# Patient Record
Sex: Male | Born: 1950
Health system: Southern US, Community
[De-identification: ages and names within clinical notes are randomized; demographics above are authoritative.]

## PROBLEM LIST (undated history)

## (undated) DIAGNOSIS — I10 Essential (primary) hypertension: Secondary | ICD-10-CM

## (undated) DIAGNOSIS — E785 Hyperlipidemia, unspecified: Secondary | ICD-10-CM

## (undated) DIAGNOSIS — Z951 Presence of aortocoronary bypass graft: Secondary | ICD-10-CM

## (undated) HISTORY — DX: Presence of aortocoronary bypass graft: Z95.1

---

## 2003-09-30 ENCOUNTER — Ambulatory Visit (HOSPITAL_COMMUNITY): Admission: RE | Admit: 2003-09-30 | Discharge: 2003-09-30 | Payer: Self-pay | Admitting: Gastroenterology

## 2012-01-14 ENCOUNTER — Other Ambulatory Visit: Payer: Self-pay | Admitting: Dermatology

## 2012-01-14 DIAGNOSIS — C439 Malignant melanoma of skin, unspecified: Secondary | ICD-10-CM

## 2012-01-14 HISTORY — DX: Malignant melanoma of skin, unspecified: C43.9

## 2013-07-05 ENCOUNTER — Other Ambulatory Visit: Payer: Self-pay | Admitting: Dermatology

## 2014-07-19 ENCOUNTER — Other Ambulatory Visit: Payer: Self-pay | Admitting: Dermatology

## 2014-09-08 ENCOUNTER — Other Ambulatory Visit: Payer: Self-pay | Admitting: Dermatology

## 2014-09-08 DIAGNOSIS — C4492 Squamous cell carcinoma of skin, unspecified: Secondary | ICD-10-CM

## 2014-09-08 HISTORY — DX: Squamous cell carcinoma of skin, unspecified: C44.92

## 2015-07-11 ENCOUNTER — Other Ambulatory Visit: Payer: Self-pay | Admitting: Dermatology

## 2016-08-20 ENCOUNTER — Other Ambulatory Visit: Payer: Self-pay | Admitting: Dermatology

## 2016-10-16 ENCOUNTER — Other Ambulatory Visit: Payer: Self-pay | Admitting: Dermatology

## 2018-01-06 ENCOUNTER — Ambulatory Visit (INDEPENDENT_AMBULATORY_CARE_PROVIDER_SITE_OTHER): Payer: Medicare Other

## 2018-01-06 ENCOUNTER — Ambulatory Visit: Payer: Medicare Other | Admitting: Podiatry

## 2018-01-06 DIAGNOSIS — M7750 Other enthesopathy of unspecified foot: Secondary | ICD-10-CM | POA: Diagnosis not present

## 2018-01-06 DIAGNOSIS — M778 Other enthesopathies, not elsewhere classified: Secondary | ICD-10-CM

## 2018-01-06 DIAGNOSIS — M722 Plantar fascial fibromatosis: Secondary | ICD-10-CM | POA: Diagnosis not present

## 2018-01-06 DIAGNOSIS — M79676 Pain in unspecified toe(s): Secondary | ICD-10-CM

## 2018-01-06 DIAGNOSIS — M7751 Other enthesopathy of right foot: Secondary | ICD-10-CM | POA: Diagnosis not present

## 2018-01-06 DIAGNOSIS — M779 Enthesopathy, unspecified: Principal | ICD-10-CM

## 2018-01-06 MED ORDER — METHYLPREDNISOLONE 4 MG PO TBPK: ORAL_TABLET | ORAL | 0 refills | Status: DC

## 2018-01-06 NOTE — Patient Instructions (Signed)

## 2018-01-06 NOTE — Progress Notes (Signed)
   Subjective:    Patient ID: David [name], male    DOB: Jan 31, 1951, 67 y.o.   MRN: [id number]  HPI: He presents today chief complaint of pain to the plantar aspect of his left heel times 6 months and the medial aspect of his right foot times about a year.  He states that he had treatment with custom orthotics but it is still painful under here as he points to the second metatarsal phalangeal joint of the right foot.  He states that he is an avid hiker walker and it hurts for him just to get out and walk leisurely.  He states the orthotics did not help at all.    Review of Systems  All other systems reviewed and are negative.      Objective:   Physical Exam: Vital signs are stable he is alert and oriented x3 no apparent distress.  Pulses are palpable neurologic sensorium is intact.  Deep tendon reflexes are intact muscle strength is +5/5 dorsiflexors plantar flexors inverters and everters all intrinsic musculature is intact.  Orthopedic evaluation demonstrates all joints distal to the ankle full range of motion without crepitation.  Cutaneous evaluation demonstrates supple well-hydrated cutis no erythema edema cellulitis drainage or odor.  There is pain on palpation subsecond metatarsal phalangeal joint of the right foot with mild reactive hyperkeratosis bilaterally right greater than that of the left.  There is no hammertoe deformity noted at this point.  He does however have pain on palpation medial calcaneal tubercle of the left heel and medial and lateral pain with compression of the calcaneus.  Radiographs taken today demonstrate elongated second metatarsal of the right foot with no bunion deformity no hammertoe deformities.  Left foot does demonstrate soft tissue increase in density at the plantar fascial calcaneal insertion site.  Capsulitis plantar flexed second metatarsal resulting in reactive hyperkeratosis plantar aspect of the second metatarsal phalangeal joint of the right foot.  Left  foot similarly however soft tissue increase in density is present.        Assessment & Plan:  Assessment: Plantar flexed second metatarsal with callus right foot.  Plantar fasciitis left foot.  Plan: Discussed etiology pathology conservative versus surgical therapies.  At this point I debrided all reactive hyperkeratosis and set him up with [facility] for orthotics to be made.  At this point he is recasted today for orthotics and I then injected his left heel today with Kenalog and local anesthetic 20 mg and 5 mg respectively after sterile Betadine skin prep and verbal consent.  Also started him on Medrol Dosepak to be followed by meloxicam.  Placement of plantar fascial strap.  Follow-up with him with those orthotics command.

## 2018-01-27 ENCOUNTER — Other Ambulatory Visit: Payer: Medicare Other | Admitting: Orthotics

## 2018-01-29 ENCOUNTER — Other Ambulatory Visit: Payer: Medicare Other | Admitting: Orthotics

## 2018-02-02 ENCOUNTER — Ambulatory Visit (INDEPENDENT_AMBULATORY_CARE_PROVIDER_SITE_OTHER): Payer: Medicare Other | Admitting: Orthotics

## 2018-02-02 DIAGNOSIS — M778 Other enthesopathies, not elsewhere classified: Secondary | ICD-10-CM

## 2018-02-02 DIAGNOSIS — M779 Enthesopathy, unspecified: Principal | ICD-10-CM

## 2018-02-02 NOTE — Progress Notes (Signed)
Patient came in today to pick up custom made foot orthotics.  The goals were accomplished and the patient reported no dissatisfaction with said orthotics.  Patient was advised of breakin period and how to report any issues. 

## 2018-02-11 ENCOUNTER — Telehealth: Payer: Self-pay | Admitting: Podiatry

## 2018-02-11 NOTE — Telephone Encounter (Signed)
pts wife left voicemail stating she contacted the insurance about getting orthotics covered and they said they would possibly cover.  Pt has [employer].  I returned call and pts wife stated [employer] told her they would cover if they meet criteria. I explained that they say that but that does not guarantee coverage and that if we were to file with insurance and they did not cover it it would be 398.00 verses the 300.00 they had already paid.It is very rare for any medicare/medicare advantage plan to cover orthotics. She said she understood and thanks for calling her back and they would just keep it as is.

## 2018-03-12 ENCOUNTER — Ambulatory Visit: Payer: Medicare Other | Admitting: Podiatry

## 2018-04-28 ENCOUNTER — Other Ambulatory Visit: Payer: Self-pay | Admitting: Dermatology

## 2018-10-02 ENCOUNTER — Observation Stay (HOSPITAL_BASED_OUTPATIENT_CLINIC_OR_DEPARTMENT_OTHER): Payer: Medicare Other

## 2018-10-02 ENCOUNTER — Other Ambulatory Visit: Payer: Self-pay

## 2018-10-02 ENCOUNTER — Encounter (HOSPITAL_COMMUNITY): Payer: Self-pay | Admitting: Emergency Medicine

## 2018-10-02 ENCOUNTER — Emergency Department (HOSPITAL_COMMUNITY): Payer: Medicare Other

## 2018-10-02 ENCOUNTER — Encounter (HOSPITAL_COMMUNITY)
Admission: EM | Disposition: A | Discharge status: Home / Self Care | Payer: Self-pay | Source: Home / Self Care | Attending: Cardiothoracic Surgery

## 2018-10-02 ENCOUNTER — Inpatient Hospital Stay (HOSPITAL_COMMUNITY)
Admission: EM | Admit: 2018-10-02 | Discharge: 2018-10-13 | DRG: 233 | Disposition: A | Discharge status: Home / Self Care | Payer: Medicare Other | Attending: Cardiothoracic Surgery | Admitting: Cardiothoracic Surgery

## 2018-10-02 ENCOUNTER — Ambulatory Visit: Payer: Self-pay | Admitting: Internal Medicine

## 2018-10-02 DIAGNOSIS — I10 Essential (primary) hypertension: Secondary | ICD-10-CM | POA: Diagnosis not present

## 2018-10-02 DIAGNOSIS — Z419 Encounter for procedure for purposes other than remedying health state, unspecified: Secondary | ICD-10-CM

## 2018-10-02 DIAGNOSIS — I200 Unstable angina: Secondary | ICD-10-CM | POA: Diagnosis not present

## 2018-10-02 DIAGNOSIS — I25110 Atherosclerotic heart disease of native coronary artery with unstable angina pectoris: Secondary | ICD-10-CM

## 2018-10-02 DIAGNOSIS — R001 Bradycardia, unspecified: Secondary | ICD-10-CM | POA: Diagnosis present

## 2018-10-02 DIAGNOSIS — R7989 Other specified abnormal findings of blood chemistry: Secondary | ICD-10-CM

## 2018-10-02 DIAGNOSIS — I5031 Acute diastolic (congestive) heart failure: Secondary | ICD-10-CM | POA: Diagnosis present

## 2018-10-02 DIAGNOSIS — I5030 Unspecified diastolic (congestive) heart failure: Secondary | ICD-10-CM

## 2018-10-02 DIAGNOSIS — R799 Abnormal finding of blood chemistry, unspecified: Secondary | ICD-10-CM | POA: Diagnosis not present

## 2018-10-02 DIAGNOSIS — I214 Non-ST elevation (NSTEMI) myocardial infarction: Principal | ICD-10-CM | POA: Diagnosis present

## 2018-10-02 DIAGNOSIS — Z87891 Personal history of nicotine dependence: Secondary | ICD-10-CM

## 2018-10-02 DIAGNOSIS — Z79899 Other long term (current) drug therapy: Secondary | ICD-10-CM

## 2018-10-02 DIAGNOSIS — R748 Abnormal levels of other serum enzymes: Secondary | ICD-10-CM

## 2018-10-02 DIAGNOSIS — E785 Hyperlipidemia, unspecified: Secondary | ICD-10-CM | POA: Diagnosis present

## 2018-10-02 DIAGNOSIS — I110 Hypertensive heart disease with heart failure: Secondary | ICD-10-CM | POA: Diagnosis present

## 2018-10-02 DIAGNOSIS — R778 Other specified abnormalities of plasma proteins: Secondary | ICD-10-CM

## 2018-10-02 DIAGNOSIS — Z951 Presence of aortocoronary bypass graft: Secondary | ICD-10-CM

## 2018-10-02 DIAGNOSIS — R110 Nausea: Secondary | ICD-10-CM | POA: Diagnosis not present

## 2018-10-02 DIAGNOSIS — I2510 Atherosclerotic heart disease of native coronary artery without angina pectoris: Secondary | ICD-10-CM | POA: Diagnosis present

## 2018-10-02 DIAGNOSIS — I480 Paroxysmal atrial fibrillation: Secondary | ICD-10-CM | POA: Diagnosis not present

## 2018-10-02 DIAGNOSIS — E7800 Pure hypercholesterolemia, unspecified: Secondary | ICD-10-CM | POA: Diagnosis not present

## 2018-10-02 DIAGNOSIS — E876 Hypokalemia: Secondary | ICD-10-CM | POA: Diagnosis present

## 2018-10-02 DIAGNOSIS — Z23 Encounter for immunization: Secondary | ICD-10-CM

## 2018-10-02 DIAGNOSIS — J9811 Atelectasis: Secondary | ICD-10-CM

## 2018-10-02 DIAGNOSIS — D62 Acute posthemorrhagic anemia: Secondary | ICD-10-CM | POA: Diagnosis not present

## 2018-10-02 DIAGNOSIS — N400 Enlarged prostate without lower urinary tract symptoms: Secondary | ICD-10-CM | POA: Diagnosis present

## 2018-10-02 DIAGNOSIS — I959 Hypotension, unspecified: Secondary | ICD-10-CM | POA: Diagnosis not present

## 2018-10-02 DIAGNOSIS — Z7982 Long term (current) use of aspirin: Secondary | ICD-10-CM

## 2018-10-02 DIAGNOSIS — Z8249 Family history of ischemic heart disease and other diseases of the circulatory system: Secondary | ICD-10-CM

## 2018-10-02 HISTORY — DX: Essential (primary) hypertension: I10

## 2018-10-02 HISTORY — PX: LEFT HEART CATH AND CORONARY ANGIOGRAPHY: CATH118249

## 2018-10-02 HISTORY — DX: Hyperlipidemia, unspecified: E78.5

## 2018-10-02 LAB — BASIC METABOLIC PANEL
Anion gap: 12 (ref 5–15)
BUN: 21 mg/dL (ref 8–23)
CALCIUM: 9.2 mg/dL (ref 8.9–10.3)
CO2: 25 mmol/L (ref 22–32)
CREATININE: 1.38 mg/dL — AB (ref 0.61–1.24)
Chloride: 105 mmol/L (ref 98–111)
GFR calc Af Amer: 60 mL/min — ABNORMAL LOW (ref >60)
GFR, EST NON AFRICAN AMERICAN: 51 mL/min — AB (ref >60)
Glucose, Bld: 118 mg/dL — ABNORMAL HIGH (ref 70–99)
Potassium: 3.1 mmol/L — ABNORMAL LOW (ref 3.5–5.1)
SODIUM: 142 mmol/L (ref 135–145)

## 2018-10-02 LAB — PROTIME-INR
INR: 1.1
Prothrombin Time: 14.1 seconds (ref 11.4–15.2)

## 2018-10-02 LAB — ECHOCARDIOGRAM COMPLETE
HEIGHTINCHES: 70 in
WEIGHTICAEL: 3520 [oz_av]

## 2018-10-02 LAB — LIPID PANEL
CHOL/HDL RATIO: 4.1 ratio
Cholesterol: 140 mg/dL (ref 0–200)
HDL: 34 mg/dL — AB (ref >40)
LDL CALC: 91 mg/dL (ref 0–99)
TRIGLYCERIDES: 74 mg/dL (ref <150)
VLDL: 15 mg/dL (ref 0–40)

## 2018-10-02 LAB — CBC
HCT: 47.3 % (ref 39.0–52.0)
HEMOGLOBIN: 15.2 g/dL (ref 13.0–17.0)
MCH: 28.7 pg (ref 26.0–34.0)
MCHC: 32.1 g/dL (ref 30.0–36.0)
MCV: 89.4 fL (ref 80.0–100.0)
PLATELETS: 186 10*3/uL (ref 150–400)
RBC: 5.29 MIL/uL (ref 4.22–5.81)
RDW: 13.6 % (ref 11.5–15.5)
WBC: 8 10*3/uL (ref 4.0–10.5)
nRBC: 0 % (ref 0.0–0.2)

## 2018-10-02 LAB — I-STAT TROPONIN, ED
TROPONIN I, POC: 0.23 ng/mL — AB (ref 0.00–0.08)
TROPONIN I, POC: 0.18 ng/mL — AB (ref 0.00–0.08)

## 2018-10-02 LAB — APTT: aPTT: 30 seconds (ref 24–36)

## 2018-10-02 LAB — HEMOGLOBIN A1C
HEMOGLOBIN A1C: 5.8 % — AB (ref 4.8–5.6)
MEAN PLASMA GLUCOSE: 119.76 mg/dL

## 2018-10-02 SURGERY — LEFT HEART CATH AND CORONARY ANGIOGRAPHY
Anesthesia: LOCAL

## 2018-10-02 MED ORDER — SODIUM CHLORIDE 0.9% FLUSH: 3.0000 mL | INTRAVENOUS | Status: DC | PRN

## 2018-10-02 MED ORDER — HEPARIN BOLUS VIA INFUSION
4000.0000 [IU] | Freq: Once | INTRAVENOUS | Status: AC
  Administered 2018-10-02: 4000 [IU] via INTRAVENOUS
  Filled 2018-10-02: qty 4000

## 2018-10-02 MED ORDER — ONDANSETRON HCL 4 MG/2ML IJ SOLN: 4.0000 mg | Freq: Four times a day (QID) | INTRAMUSCULAR | Status: DC | PRN

## 2018-10-02 MED ORDER — HEPARIN (PORCINE) IN NACL 1000-0.9 UT/500ML-% IV SOLN
INTRAVENOUS | Status: DC | PRN
  Administered 2018-10-02 (×2): 500 mL

## 2018-10-02 MED ORDER — AMLODIPINE BESYLATE 5 MG PO TABS
5.0000 mg | ORAL_TABLET | Freq: Every day | ORAL | Status: DC
  Administered 2018-10-02 – 2018-10-05 (×4): 5 mg via ORAL
  Filled 2018-10-02 (×4): qty 1

## 2018-10-02 MED ORDER — MIDAZOLAM HCL 2 MG/2ML IJ SOLN
INTRAMUSCULAR | Status: AC
  Filled 2018-10-02: qty 2

## 2018-10-02 MED ORDER — HEPARIN (PORCINE) IN NACL 100-0.45 UNIT/ML-% IJ SOLN
1250.0000 [IU]/h | INTRAMUSCULAR | Status: DC
  Administered 2018-10-02: 1250 [IU]/h via INTRAVENOUS
  Filled 2018-10-02: qty 250

## 2018-10-02 MED ORDER — FENTANYL CITRATE (PF) 100 MCG/2ML IJ SOLN
INTRAMUSCULAR | Status: AC
  Filled 2018-10-02: qty 2

## 2018-10-02 MED ORDER — SODIUM CHLORIDE 0.9 % WEIGHT BASED INFUSION: 1.0000 mL/kg/h | INTRAVENOUS | Status: DC

## 2018-10-02 MED ORDER — ACETAMINOPHEN 325 MG PO TABS: 650.0000 mg | ORAL_TABLET | ORAL | Status: DC | PRN

## 2018-10-02 MED ORDER — TAMSULOSIN HCL 0.4 MG PO CAPS
0.4000 mg | ORAL_CAPSULE | Freq: Every day | ORAL | Status: DC
  Administered 2018-10-02 – 2018-10-12 (×10): 0.4 mg via ORAL
  Filled 2018-10-02 (×10): qty 1

## 2018-10-02 MED ORDER — MIDAZOLAM HCL 2 MG/2ML IJ SOLN
INTRAMUSCULAR | Status: DC | PRN
  Administered 2018-10-02: 1 mg via INTRAVENOUS

## 2018-10-02 MED ORDER — SODIUM CHLORIDE 0.9 % IV SOLN: 250.0000 mL | INTRAVENOUS | Status: DC | PRN

## 2018-10-02 MED ORDER — ASPIRIN 81 MG PO CHEW: 81.0000 mg | CHEWABLE_TABLET | ORAL | Status: DC

## 2018-10-02 MED ORDER — HEPARIN SODIUM (PORCINE) 5000 UNIT/ML IJ SOLN
4000.0000 [IU] | Freq: Once | INTRAMUSCULAR | Status: DC
  Filled 2018-10-02: qty 1

## 2018-10-02 MED ORDER — SODIUM CHLORIDE 0.9% FLUSH
3.0000 mL | Freq: Two times a day (BID) | INTRAVENOUS | Status: DC
  Administered 2018-10-04 – 2018-10-05 (×2): 3 mL via INTRAVENOUS

## 2018-10-02 MED ORDER — POTASSIUM CHLORIDE CRYS ER 20 MEQ PO TBCR
40.0000 meq | EXTENDED_RELEASE_TABLET | ORAL | Status: AC
  Administered 2018-10-02: 40 meq via ORAL
  Filled 2018-10-02: qty 2

## 2018-10-02 MED ORDER — ASPIRIN 81 MG PO CHEW
324.0000 mg | CHEWABLE_TABLET | ORAL | Status: AC
  Administered 2018-10-02: 324 mg via ORAL
  Filled 2018-10-02: qty 4

## 2018-10-02 MED ORDER — VERAPAMIL HCL 2.5 MG/ML IV SOLN
INTRAVENOUS | Status: AC
  Filled 2018-10-02: qty 2

## 2018-10-02 MED ORDER — VERAPAMIL HCL 2.5 MG/ML IV SOLN
INTRAVENOUS | Status: DC | PRN
  Administered 2018-10-02: 10 mL via INTRA_ARTERIAL

## 2018-10-02 MED ORDER — ATORVASTATIN CALCIUM 80 MG PO TABS
80.0000 mg | ORAL_TABLET | Freq: Every day | ORAL | Status: DC
  Administered 2018-10-02 – 2018-10-12 (×10): 80 mg via ORAL
  Filled 2018-10-02 (×10): qty 1

## 2018-10-02 MED ORDER — NITROGLYCERIN 0.4 MG SL SUBL: 0.4000 mg | SUBLINGUAL_TABLET | SUBLINGUAL | Status: DC | PRN

## 2018-10-02 MED ORDER — ASPIRIN EC 81 MG PO TBEC: 81.0000 mg | DELAYED_RELEASE_TABLET | Freq: Every day | ORAL | Status: DC

## 2018-10-02 MED ORDER — HEPARIN SODIUM (PORCINE) 1000 UNIT/ML IJ SOLN
INTRAMUSCULAR | Status: DC | PRN
  Administered 2018-10-02: 5000 [IU] via INTRAVENOUS

## 2018-10-02 MED ORDER — FENTANYL CITRATE (PF) 100 MCG/2ML IJ SOLN
INTRAMUSCULAR | Status: DC | PRN
  Administered 2018-10-02: 25 ug via INTRAVENOUS

## 2018-10-02 MED ORDER — ASPIRIN EC 81 MG PO TBEC
81.0000 mg | DELAYED_RELEASE_TABLET | Freq: Every day | ORAL | Status: DC
  Administered 2018-10-03 – 2018-10-05 (×3): 81 mg via ORAL
  Filled 2018-10-02 (×3): qty 1

## 2018-10-02 MED ORDER — SODIUM CHLORIDE 0.9 % WEIGHT BASED INFUSION: 3.0000 mL/kg/h | INTRAVENOUS | Status: AC

## 2018-10-02 MED ORDER — SODIUM CHLORIDE 0.9% FLUSH
3.0000 mL | Freq: Two times a day (BID) | INTRAVENOUS | Status: DC
  Administered 2018-10-02 – 2018-10-06 (×2): 3 mL via INTRAVENOUS

## 2018-10-02 MED ORDER — IOHEXOL 350 MG/ML SOLN
INTRAVENOUS | Status: DC | PRN
  Administered 2018-10-02: 45 mL via INTRAVENOUS

## 2018-10-02 MED ORDER — ASPIRIN 300 MG RE SUPP
300.0000 mg | RECTAL | Status: AC
  Filled 2018-10-02: qty 1

## 2018-10-02 MED ORDER — SODIUM CHLORIDE 0.9 % IV SOLN
INTRAVENOUS | Status: DC
  Administered 2018-10-02: 10 mL/h via INTRAVENOUS

## 2018-10-02 MED ORDER — LIDOCAINE HCL (PF) 1 % IJ SOLN
INTRAMUSCULAR | Status: AC
  Filled 2018-10-02: qty 30

## 2018-10-02 MED ORDER — SODIUM CHLORIDE 0.9 % IV SOLN
INTRAVENOUS | Status: AC
  Administered 2018-10-02: 17:00:00 via INTRAVENOUS

## 2018-10-02 MED ORDER — ASPIRIN 81 MG PO CHEW
324.0000 mg | CHEWABLE_TABLET | Freq: Once | ORAL | Status: AC
  Administered 2018-10-02: 324 mg via ORAL
  Filled 2018-10-02: qty 4

## 2018-10-02 MED ORDER — HEPARIN (PORCINE) IN NACL 100-0.45 UNIT/ML-% IJ SOLN
1200.0000 [IU]/h | INTRAMUSCULAR | Status: DC
  Administered 2018-10-02 – 2018-10-05 (×4): 1200 [IU]/h via INTRAVENOUS
  Filled 2018-10-02 (×4): qty 250

## 2018-10-02 MED ORDER — LIDOCAINE HCL (PF) 1 % IJ SOLN
INTRAMUSCULAR | Status: DC | PRN
  Administered 2018-10-02: 2 mL via INTRADERMAL

## 2018-10-02 MED ORDER — HEPARIN (PORCINE) IN NACL 1000-0.9 UT/500ML-% IV SOLN
INTRAVENOUS | Status: AC
  Filled 2018-10-02: qty 1000

## 2018-10-02 MED ORDER — ASPIRIN 81 MG PO CHEW: 81.0000 mg | CHEWABLE_TABLET | Freq: Every day | ORAL | Status: DC

## 2018-10-02 SURGICAL SUPPLY — 11 items
CATH INFINITI 5 FR JL3.5 (CATHETERS) ×2 IMPLANT
CATH INFINITI JR4 5F (CATHETERS) ×1 IMPLANT
DEVICE RAD COMP TR BAND LRG (VASCULAR PRODUCTS) ×1 IMPLANT
GLIDESHEATH SLEND A-KIT 6F 22G (SHEATH) ×1 IMPLANT
GUIDEWIRE INQWIRE 1.5J.035X260 (WIRE) IMPLANT
INQWIRE 1.5J .035X260CM (WIRE) ×2
KIT HEART LEFT (KITS) ×2 IMPLANT
PACK CARDIAC CATHETERIZATION (CUSTOM PROCEDURE TRAY) ×2 IMPLANT
SHEATH PROBE COVER 6X72 (BAG) ×1 IMPLANT
TRANSDUCER W/STOPCOCK (MISCELLANEOUS) ×2 IMPLANT
TUBING CIL FLEX 10 FLL-RA (TUBING) ×2 IMPLANT

## 2018-10-02 NOTE — Progress Notes (Signed)
CT Surgery  Coronary angiograms, echocardiogram images reviewed Patient scheduled for CABG mid next week Will do full consult tomorrow P Prescott Gum

## 2018-10-02 NOTE — Progress Notes (Signed)
[place] for heparin Indication: chest pain/ACS  Heparin Dosing Weight: 93.8 kg  Labs: Recent Labs    10/02/18 0520 10/02/18 0830  HGB 15.2  --   HCT 47.3  --   PLT 186  --   APTT  --  30  LABPROT  --  14.1  INR  --  1.10  CREATININE 1.38*  --     Assessment: 48 yom presenting with CP, +troponin. Pharmacy consulted to dose heparin for ACS. Not on anticoagulation PTA. Cards planning cath. CBC wnl.  Goal of Therapy:  Heparin level 0.3-0.7 units/ml Monitor platelets by anticoagulation protocol: Yes   Plan:  Heparin 4000 unit bolus Start heparin at 1250 units/h 6h heparin level Daily heparin level/CBC Monitor s/sx bleeding  Elicia Lamp, PharmD, BCPS Clinical Pharmacist 10/02/2018 9:04 AM

## 2018-10-02 NOTE — ED Provider Notes (Signed)
[facility] EMERGENCY DEPARTMENT Provider Note   CSN: [id number] Arrival date & time: 10/02/18  0503     History   Chief Complaint Chief Complaint  Patient presents with  . abnormal labs    HPI David [name] is a 67 y.o. male with history of hypertension, hyperlipidemia, remote tobacco use is here for chest pain.  First episode of chest pain occurred on Wednesday while he was walking in his yard.  CP is described as a mild discomfort like when you are running outside in cold weather.  CP radiates to bilateral shoulders.  CP goes away after 5 to 10 minutes after sitting down and rest.  CP occurred again when walking up a hill and when blowing leaves in the yard.  Associated with shortness of breath and palpitations.  No associated nausea, vomiting, diaphoresis, lightheadedness, recent leg swelling, orthopnea, cough, fever.  He takes a daily aspirin which he took yesterday morning.  He went to see his PCP, Dr. Doyle Askew, who obtain an EKG and blood work.  He was called to this morning by on-call physician who told him to come to the ER for elevated troponin level 0.1.  No history of diabetes.  He quit smoking 40 years ago, smoked for approximately 8 years.  No EtOH or illicit drug use.  No known CAD.  Mother died of heart attack at 72, brother had a heart attack at 54.  HPI  Past Medical History:  Diagnosis Date  . Dyslipidemia   . Essential hypertension     Patient Active Problem List   Diagnosis Date Noted  . NSTEMI (non-ST elevated myocardial infarction) ([place]) 10/02/2018    History reviewed. No pertinent surgical history.      Home Medications    Prior to Admission medications   Medication Sig Start Date End Date Taking? Authorizing Provider  amLODipine (NORVASC) 5 MG tablet Take 5 mg by mouth daily.  12/30/17  Yes [provider]  aspirin EC 81 MG tablet Take 81 mg by mouth daily.   Yes [provider]  atorvastatin (LIPITOR) 20 MG tablet  Take 20 mg by mouth daily. 12/31/17  Yes [provider]  benazepril (LOTENSIN) 40 MG tablet Take 40 mg by mouth daily.  12/30/17  Yes [provider]  hydrochlorothiazide (HYDRODIURIL) 25 MG tablet Take 25 mg by mouth daily.  12/30/17  Yes [provider]  tamsulosin (FLOMAX) 0.4 MG CAPS capsule Take 0.4 mg by mouth daily. 12/30/17  Yes [provider]    Family History Family History  Problem Relation Age of Onset  . Heart attack Mother 62  . Heart attack Brother 65    Social History Social History   Tobacco Use  . Smoking status: Not on file  Substance Use Topics  . Alcohol use: Not on file  . Drug use: Not on file     Allergies   Patient has no known allergies.   Review of Systems Review of Systems  Respiratory: Positive for shortness of breath.   Cardiovascular: Positive for chest pain and palpitations.  All other systems reviewed and are negative.    Physical Exam Updated Vital Signs BP 128/74   Pulse 64   Temp (!) 97.5 F (36.4 C) (Oral)   Resp (!) 9   Ht 5\' 10"  (1.778 m)   Wt 99.8 kg   SpO2 94%   BMI 31.57 kg/m   Physical Exam  Constitutional: He appears well-developed and well-nourished.  NAD. Non toxic.  HENT:  Head: Normocephalic and atraumatic.  Nose: Nose normal.  Eyes: Conjunctivae, EOM and lids are normal.  Neck: Trachea normal and normal range of motion.  Trachea midline.   Cardiovascular: Normal rate, regular rhythm, S1 normal, S2 normal and normal heart sounds.  Pulses:      Radial pulses are 2+ on the right side, and 2+ on the left side.       Dorsalis pedis pulses are 2+ on the right side, and 2+ on the left side.  No LE edema or calf tenderness.   Pulmonary/Chest: Effort normal and breath sounds normal.  No anterior chest wall tenderness.   Abdominal: Soft. Bowel sounds are normal. There is no tenderness.  No epigastric tenderness. No distention.   Neurological: He is alert. GCS eye subscore is 4. GCS  verbal subscore is 5. GCS motor subscore is 6.  Skin: Skin is warm and dry. Capillary refill takes less than 2 seconds.  No rash to chest wall  Psychiatric: His speech is normal and behavior is normal. Thought content normal. Cognition and memory are normal.     ED Treatments / Results  Labs (all labs ordered are listed, but only abnormal results are displayed) Labs Reviewed  BASIC METABOLIC PANEL - Abnormal; Notable for the following components:      Result Value   Potassium 3.1 (*)    Glucose, Bld 118 (*)    Creatinine, Ser 1.38 (*)    GFR calc non Af Amer 51 (*)    GFR calc Af Amer 60 (*)    All other components within normal limits  I-STAT TROPONIN, ED - Abnormal; Notable for the following components:   Troponin i, poc 0.23 (*)    All other components within normal limits  I-STAT TROPONIN, ED - Abnormal; Notable for the following components:   Troponin i, poc 0.18 (*)    All other components within normal limits  CBC  PROTIME-INR  APTT  LIPID PANEL    EKG EKG Interpretation  Date/Time:  Friday October 02 2018 05:22:18 EDT Ventricular Rate:  71 PR Interval:  188 QRS Duration: 94 QT Interval:  444 QTC Calculation: 482 R Axis:   -26 Text Interpretation:  Sinus rhythm with marked sinus arrhythmia with Premature atrial complexes Septal infarct , age undetermined Abnormal ECG No previous ECGs available Confirmed by Ripley Fraise [phone]) on 10/02/2018 6:15:00 AM Also confirmed by Ripley Fraise [phone]), editor Philomena Doheny [phone])  on 10/02/2018 7:16:57 AM   Radiology Dg Chest 2 View  Result Date: 10/02/2018 CLINICAL DATA:  Chest pain EXAM: CHEST - 2 VIEW COMPARISON:  None. FINDINGS: The heart size and mediastinal contours are within normal limits. Both lungs are clear. The visualized skeletal structures are unremarkable. IMPRESSION: No active cardiopulmonary disease. Electronically Signed   By: Ulyses Jarred M.D.   On: 10/02/2018 05:58    Procedures .Critical  Care Performed by: Kinnie Feil, PA-C Authorized by: Kinnie Feil, PA-C   Critical care provider statement:    Critical care time (minutes):  45   Critical care was necessary to treat or prevent imminent or life-threatening deterioration of the following conditions:  Renal failure (NSTEMI)   Critical care was time spent personally by me on the following activities:  Discussions with consultants, evaluation of patient's response to treatment, examination of patient, ordering and performing treatments and interventions, ordering and review of laboratory studies, ordering and review of radiographic studies, pulse oximetry, re-evaluation of patient's condition, obtaining history from patient or  surrogate and review of old charts   I assumed direction of critical care for this patient from another provider in my specialty: no     (including critical care time)  Medications Ordered in ED Medications  0.9 %  sodium chloride infusion (has no administration in time range)  aspirin chewable tablet 324 mg (has no administration in time range)  amLODipine (NORVASC) tablet 5 mg (has no administration in time range)  0.9% sodium chloride infusion (has no administration in time range)    Followed by  0.9% sodium chloride infusion (has no administration in time range)  potassium chloride SA (K-DUR,KLOR-CON) CR tablet 40 mEq (has no administration in time range)     Initial Impression / Assessment and Plan / ED Course  I have reviewed the triage vital signs and the nursing notes.  Pertinent labs & imaging results that were available during my care of the patient were reviewed by me and considered in my medical decision making (see chart for details).  Clinical Course as of Oct 03 907  Fri Oct 02, 2018  2774 Potassium(!): 3.1 [CG]  1287 Creatinine(!): 1.38 [CG]  0635 Troponin i, poc(!!): 0.23 [CG]  8676 Last time CP yest afternoon    [CG]  0703 Spoke to cardiology Trinna Post), cardiology  to see.    [CG]  [id number] Troponin i, poc(!!): 0.18 [CG]    Clinical Course User Index [CG] Kinnie Feil, [place]   0705: 67 yo w/ HTN, HLD, strong family hx CAD, remote tobacco use here with exertional CP, SOB. Concern for unstable angin. Pt is CP free currently, CP appears to only occurs with light exertion.  EKG w/o STE, with intermittent PVCs.  Troponin 0 0.23.  Creatinine 1.38.  Potassium 3.1.  Will give aspirin.  Spoke to cardiology.   0745: Heparin ordered. Cardiology to see. No changes in CP.   0910: Cardiology admitted pt. Plan for LHC today and echo.   Final Clinical Impressions(s) / ED Diagnoses   Final diagnoses:  Elevated troponin  NSTEMI (non-ST elevated myocardial infarction) (H[facility])  Unstable angina pectoris (H[facility])  Elevated creatine kinase    ED Discharge Orders    None       Arlean Hopping 10/02/18 7209    Davonna Belling, MD 10/05/18 1524

## 2018-10-02 NOTE — ED Triage Notes (Signed)
Dr. Harlan Stains called to say she was sending pt to the ED, reports a positive trop yesterday of 0.1.  He went to see her for chest "pressure" since Wednesday.  No pain this morning.  Pt stated he got very "winded after working yesterday".

## 2018-10-02 NOTE — ED Notes (Signed)
Patient denies pain and is resting comfortably.  

## 2018-10-02 NOTE — ED Notes (Signed)
Lab results was given to  Nurse Mal[place].

## 2018-10-02 NOTE — Progress Notes (Signed)
  Echocardiogram 2D Echocardiogram has been performed.  David [name] 10/02/2018, 6:11 PM

## 2018-10-02 NOTE — H&P (Addendum)
Cardiology Admission History and Physical:   Patient ID: David [name] MRN: [id number]; DOB: Sep 25, 1951   Admission date: 10/02/2018  Primary Care Provider: Patient, No Pcp Per Primary Cardiologist: New to [facility] Primary Electrophysiologist:  None   Chief Complaint:  Chest pain  Patient Profile:   David [name] is a 67 y.o. male with PMH of HTN, HLD, remote tobacco abuse, who presented to his PCP office yesterday for chest pain, found to have elevated troponin of 0.1, and instructed to present to the ED for further evaluation  History of Present Illness:   David [name] was in his usual state of health until Wednesday 09/30/18, when he experienced an episode of chest pain while walking in his yard which radiated to bilateral shoulders and resolved with rest. Pain initially lasted 5-10 minutes and was without associated symptoms. He continued to have intermittent episodes of exertional chest discomfort described as a pressure-like sensation over the next 36 hours. On the evening of 10/01/18 he had chest pressure while walking up a hill blowing leaves which was associated with SOB and palpitations. He presented to his PCP office 10/01/18 for these complaints and had an EKG and blood work. He received a call this morning, notifying him that his troponin level was 0.1 and he should present to the ED for further evaluation.   He denies prior cardiac history. He has never had an ischemic evaluation. He reports PMH of HTN, HLD, and former tobacco use (quit 40 years ago). He reports a significant family history of CAD with his mother passing from an MI at age 41 and brother with an MI at age 55.   At the time of this evaluation he is chest pain free. He is fairly active at baseline and likes to go on hikes. He has never experienced chest pain prior to Wednesday. He denies SOB, DOE, dizziness, lightheadedness, syncope, orthopnea, PND, LE edema, melena, hematochezia, or hematuria. He has not had  anything to eat or drink today.   ED course: VSS. Labs notable for K 3.1, Cr 1.38 (AKI vs. CKD?), CBC wnl, Trop 0.23. EKG with sinus rhythm with frequent PACs, no STE/D, no TWI; no prior comparison. CXR without acute findings. Patient was given ASA 324mg  on arrival and started on a heparin gtt. Cardiology asked to evaluate for chest pain.    Past Medical History:  Diagnosis Date  . Dyslipidemia   . Essential hypertension     History reviewed. No pertinent surgical history.   Medications Prior to Admission: Prior to Admission medications   Medication Sig Start Date End Date Taking? Authorizing Provider  amLODipine (NORVASC) 5 MG tablet  12/30/17   [provider]  atorvastatin (LIPITOR) 20 MG tablet Take 20 mg by mouth daily. 12/31/17   [provider]  benazepril (LOTENSIN) 40 MG tablet  12/30/17   [provider]  hydrochlorothiazide (HYDRODIURIL) 25 MG tablet  12/30/17   [provider]  methylPREDNISolone (MEDROL DOSEPAK) 4 MG TBPK tablet 6 day dose pack - take as directed 01/06/18   Hyatt, Max T, DPM  tamsulosin (FLOMAX) 0.4 MG CAPS capsule Take 0.4 mg by mouth daily. 12/30/17   [provider]     Allergies:   No Known Allergies  Social History:   Social History   Socioeconomic History  . Marital status: Married    Spouse name: Not on file  . Number of children: Not on file  . Years of education: Not on file  . Highest education level:  Not on file  Occupational History  . Not on file  Social Needs  . Financial resource strain: Not on file  . Food insecurity:    Worry: Not on file    Inability: Not on file  . Transportation needs:    Medical: Not on file    Non-medical: Not on file  Tobacco Use  . Smoking status: Not on file  Substance and Sexual Activity  . Alcohol use: Not on file  . Drug use: Not on file  . Sexual activity: Not on file  Lifestyle  . Physical activity:    Days per week: Not on file    Minutes per session:  Not on file  . Stress: Not on file  Relationships  . Social connections:    Talks on phone: Not on file    Gets together: Not on file    Attends religious service: Not on file    Active member of club or organization: Not on file    Attends meetings of clubs or organizations: Not on file    Relationship status: Not on file  . Intimate partner violence:    Fear of current or ex partner: Not on file    Emotionally abused: Not on file    Physically abused: Not on file    Forced sexual activity: Not on file  Other Topics Concern  . Not on file  Social History Narrative  . Not on file    Family History:   The patient's family history includes Heart attack (age of onset: 16) in his brother; Heart attack (age of onset: 40) in his mother.    ROS:  Please see the history of present illness.  All other ROS reviewed and negative.     Physical Exam/Data:   Vitals:   10/02/18 0600 10/02/18 0615 10/02/18 0630 10/02/18 0645  BP: 136/76 119/75 125/74 (!) 123/98  Pulse: 67 65 63 65  Resp: 12 13 12 15   Temp:      TempSrc:      SpO2: 99% 93% 94% 96%  Weight:      Height:       No intake or output data in the 24 hours ending 10/02/18 0737 Filed Weights   10/02/18 0528  Weight: 99.8 kg   Body mass index is 31.57 kg/m.  General:  Well nourished, well developed, laying in bed in no acute distress HEENT: sclera anicteric Neck: no JVD Vascular: No carotid bruits; distal pulses 2+ bilaterally  Cardiac:  normal S1, S2; RRR; no murmurs, rubs, or gallops  Lungs:  clear to auscultation bilaterally, no wheezing, rhonchi or rales  Abd: soft, nontender, no hepatomegaly  Ext: no edema Musculoskeletal:  No deformities, BUE and BLE strength normal and equal Skin: warm and dry  Neuro:  CNs 2-12 intact, no focal abnormalities noted Psych:  Normal affect    EKG:  sinus rhythm with frequent PACs, no STE/D, no TWI; no prior comparison.   Relevant CV Studies: None  Laboratory  Data:  Chemistry Recent Labs  Lab 10/02/18 0520  NA 142  K 3.1*  CL 105  CO2 25  GLUCOSE 118*  BUN 21  CREATININE 1.38*  CALCIUM 9.2  GFRNONAA 51*  GFRAA 60*  ANIONGAP 12    No results for input(s): PROT, ALBUMIN, AST, ALT, ALKPHOS, BILITOT in the last 168 hours. Hematology Recent Labs  Lab 10/02/18 0520  WBC 8.0  RBC 5.29  HGB 15.2  HCT 47.3  MCV 89.4  MCH 28.7  MCHC 32.1  RDW 13.6  PLT 186   Cardiac EnzymesNo results for input(s): TROPONINI in the last 168 hours.  Recent Labs  Lab 10/02/18 0525  TROPIPOC 0.23*    BNPNo results for input(s): BNP, PROBNP in the last 168 hours.  DDimer No results for input(s): DDIMER in the last 168 hours.  Radiology/Studies:  Dg Chest 2 View  Result Date: 10/02/2018 CLINICAL DATA:  Chest pain EXAM: CHEST - 2 VIEW COMPARISON:  None. FINDINGS: The heart size and mediastinal contours are within normal limits. Both lungs are clear. The visualized skeletal structures are unremarkable. IMPRESSION: No active cardiopulmonary disease. Electronically Signed   By: Ulyses Jarred M.D.   On: 10/02/2018 05:58    Assessment and Plan:   1. Chest pain: patient presented with exertional chest pain. Found to have elevated trop of 0.1 at PCP office. Sent to ED for further evaluation. Here trop 0.23. EKG without ischemic changes. No prior ischemic evaluation. Risk factors for ACS include HTN, HLD, and family history of CAD (mother died of MI at 105yo, brother with MI at 58yo).  - Plan for LHC today to evaluate for coronary anatomy - Keep NPO - Continue heparin gtt - Continue aspirin and statin - Will check HgbA1C and lipid panel  - Check an echocardiogram to assess LV function - Gentle hydration given Cr 1.38 (unclear if this is baseline)  2. HTN: BP stable - Continue amlodipine - Would likely benefit from BBlocker if HR can tolerate.  - Hold home benzapril and HCTZ in anticipation of LHC  3. HLD: on atorvastatin 20mg  daily - Check FLP -  Will increase atorvastatin to 80mg  daily in the setting of ACS  4. BPH: - Continue flomax  5. AKI vs. CKD: Cr 1.38 on admission; denies prior history of kidney dysfunction.  - Will hydrate prior to catheterization. Recheck labs in the morning  Severity of Illness: The appropriate patient status for this patient is OBSERVATION. Observation status is judged to be reasonable and necessary in order to provide the required intensity of service to ensure the patient's safety. The patient's presenting symptoms, physical exam findings, and initial radiographic and laboratory data in the context of their medical condition is felt to place them at decreased risk for further clinical deterioration. Furthermore, it is anticipated that the patient will be medically stable for discharge from the hospital within 2 midnights of admission. The following factors support the patient status of observation.   " The patient's presenting symptoms include chest pain. " The physical exam findings include benign cardiopulmonary exam. " The initial radiographic and laboratory data are troponin elevated to  0.2.     For questions or updates, please contact [place] Please consult [email or web address] for contact info under        Signed, Abigail Butts, PA-C  10/02/2018 7:37 AM   I have seen and examined the patient along with Abigail Butts, PA-C .  I have reviewed the chart, notes and new data.  I agree with PA/NP's note.  Key new complaints: clinical syndrome very consistent with new onset and crescendo angina Key examination changes: normal CV exam except S4 gallop Key new findings / data: creat 1.38,   PLAN: Urgent cardiac catheterization, revascularization as appropriate.. This procedure has been fully reviewed with the patient and written informed consent has been obtained. IV heparin. High dose statin. ASA. Carvedilol cautiously since he is relatively bradycardic. Hydrate well and monitor renal  function carefully. Stop ACEi and HCTZ,  get records from [place] about previous renal parameters.   Sanda Klein, MD, [place] [phone] 10/02/2018, 10:39 AM

## 2018-10-02 NOTE — Plan of Care (Signed)
  Problem: Education: Goal: Knowledge of General Education information will improve Description: Including pain rating scale, medication(s)/side effects and non-pharmacologic comfort measures Outcome: Progressing   Problem: Clinical Measurements: Goal: Ability to maintain clinical measurements within normal limits will improve Outcome: Progressing   Problem: Activity: Goal: Risk for activity intolerance will decrease Outcome: Progressing   Problem: Coping: Goal: Level of anxiety will decrease Outcome: Progressing   Problem: Elimination: Goal: Will not experience complications related to urinary retention Outcome: Progressing   Problem: Safety: Goal: Ability to remain free from injury will improve Outcome: Progressing   

## 2018-10-02 NOTE — Progress Notes (Signed)
[place] for heparin Indication: chest pain/ACS  Heparin Dosing Weight: 93.8 kg  Labs: Recent Labs    10/02/18 0520 10/02/18 0830  HGB 15.2  --   HCT 47.3  --   PLT 186  --   APTT  --  30  LABPROT  --  14.1  INR  --  1.10  CREATININE 1.38*  --     Assessment: 56 yom presenting with CP, +troponin. Pharmacy consulted to dose heparin for ACS. Not on anticoagulation PTA.   S/p cath; Severe two-vessel coronary disease with mid LAD 99% stenosis within a calcified segment. The second diagonal contains 90% stenosis.   CVTS will be consulted, heparin to restart tonight after TR band is off. There was no heparin level prior to cath so will restart at previous rate.   Goal of Therapy:  Heparin level 0.3-0.7 units/ml Monitor platelets by anticoagulation protocol: Yes   Plan:  Start heparin at 1200 units/h 8h heparin level Daily heparin level/CBC Monitor s/sx bleeding  Erin Hearing PharmD., BCPS Clinical Pharmacist 10/02/2018 5:01 PM

## 2018-10-02 NOTE — Interval H&P Note (Signed)
Cath Lab Visit (complete for each Cath Lab visit)  Clinical Evaluation Leading to the Procedure:   ACS: Yes.    Non-ACS:    Anginal Classification: CCS III  Anti-ischemic medical therapy: Minimal Therapy (1 class of medications)  Non-Invasive Test Results: No non-invasive testing performed  Prior CABG: No previous CABG      History and Physical Interval Note:  10/02/2018 3:34 PM  David [name]  has presented today for surgery, with the diagnosis of NSTEMI  The various methods of treatment have been discussed with the patient and family. After consideration of risks, benefits and other options for treatment, the patient has consented to  Procedure(s): LEFT HEART CATH AND CORONARY ANGIOGRAPHY (N/A) as a surgical intervention .  The patient's history has been reviewed, patient examined, no change in status, stable for surgery.  I have reviewed the patient's chart and labs.  Questions were answered to the patient's satisfaction.     Belva Crome III

## 2018-10-03 ENCOUNTER — Encounter (HOSPITAL_COMMUNITY): Payer: Self-pay

## 2018-10-03 ENCOUNTER — Other Ambulatory Visit: Payer: Self-pay

## 2018-10-03 DIAGNOSIS — R001 Bradycardia, unspecified: Secondary | ICD-10-CM | POA: Diagnosis present

## 2018-10-03 DIAGNOSIS — I214 Non-ST elevation (NSTEMI) myocardial infarction: Secondary | ICD-10-CM | POA: Diagnosis present

## 2018-10-03 DIAGNOSIS — I4891 Unspecified atrial fibrillation: Secondary | ICD-10-CM | POA: Diagnosis not present

## 2018-10-03 DIAGNOSIS — Z01810 Encounter for preprocedural cardiovascular examination: Secondary | ICD-10-CM | POA: Diagnosis not present

## 2018-10-03 DIAGNOSIS — Z7982 Long term (current) use of aspirin: Secondary | ICD-10-CM | POA: Diagnosis not present

## 2018-10-03 DIAGNOSIS — D62 Acute posthemorrhagic anemia: Secondary | ICD-10-CM | POA: Diagnosis not present

## 2018-10-03 DIAGNOSIS — I959 Hypotension, unspecified: Secondary | ICD-10-CM | POA: Diagnosis not present

## 2018-10-03 DIAGNOSIS — I110 Hypertensive heart disease with heart failure: Secondary | ICD-10-CM | POA: Diagnosis present

## 2018-10-03 DIAGNOSIS — R110 Nausea: Secondary | ICD-10-CM | POA: Diagnosis not present

## 2018-10-03 DIAGNOSIS — I10 Essential (primary) hypertension: Secondary | ICD-10-CM | POA: Diagnosis not present

## 2018-10-03 DIAGNOSIS — I200 Unstable angina: Secondary | ICD-10-CM | POA: Diagnosis present

## 2018-10-03 DIAGNOSIS — I25110 Atherosclerotic heart disease of native coronary artery with unstable angina pectoris: Secondary | ICD-10-CM | POA: Diagnosis present

## 2018-10-03 DIAGNOSIS — R7989 Other specified abnormal findings of blood chemistry: Secondary | ICD-10-CM | POA: Diagnosis not present

## 2018-10-03 DIAGNOSIS — Z23 Encounter for immunization: Secondary | ICD-10-CM | POA: Diagnosis not present

## 2018-10-03 DIAGNOSIS — E876 Hypokalemia: Secondary | ICD-10-CM | POA: Diagnosis present

## 2018-10-03 DIAGNOSIS — I480 Paroxysmal atrial fibrillation: Secondary | ICD-10-CM | POA: Diagnosis not present

## 2018-10-03 DIAGNOSIS — N400 Enlarged prostate without lower urinary tract symptoms: Secondary | ICD-10-CM | POA: Diagnosis present

## 2018-10-03 DIAGNOSIS — Z79899 Other long term (current) drug therapy: Secondary | ICD-10-CM | POA: Diagnosis not present

## 2018-10-03 DIAGNOSIS — E785 Hyperlipidemia, unspecified: Secondary | ICD-10-CM | POA: Diagnosis present

## 2018-10-03 DIAGNOSIS — Z87891 Personal history of nicotine dependence: Secondary | ICD-10-CM | POA: Diagnosis not present

## 2018-10-03 DIAGNOSIS — I5031 Acute diastolic (congestive) heart failure: Secondary | ICD-10-CM | POA: Diagnosis present

## 2018-10-03 DIAGNOSIS — E7800 Pure hypercholesterolemia, unspecified: Secondary | ICD-10-CM | POA: Diagnosis not present

## 2018-10-03 DIAGNOSIS — Z8249 Family history of ischemic heart disease and other diseases of the circulatory system: Secondary | ICD-10-CM | POA: Diagnosis not present

## 2018-10-03 LAB — URINALYSIS, ROUTINE W REFLEX MICROSCOPIC
Bilirubin Urine: NEGATIVE
Glucose, UA: NEGATIVE mg/dL
Hgb urine dipstick: NEGATIVE
Ketones, ur: NEGATIVE mg/dL
Leukocytes, UA: NEGATIVE
Nitrite: NEGATIVE
Protein, ur: NEGATIVE mg/dL
Specific Gravity, Urine: 1.031 — ABNORMAL HIGH (ref 1.005–1.030)
pH: 6 (ref 5.0–8.0)

## 2018-10-03 LAB — BASIC METABOLIC PANEL
ANION GAP: 9 (ref 5–15)
BUN: 17 mg/dL (ref 8–23)
CALCIUM: 8.7 mg/dL — AB (ref 8.9–10.3)
CO2: 25 mmol/L (ref 22–32)
CREATININE: 1.15 mg/dL (ref 0.61–1.24)
Chloride: 108 mmol/L (ref 98–111)
GLUCOSE: 111 mg/dL — AB (ref 70–99)
Potassium: 3.4 mmol/L — ABNORMAL LOW (ref 3.5–5.1)
Sodium: 142 mmol/L (ref 135–145)

## 2018-10-03 LAB — CBC
HCT: 43 % (ref 39.0–52.0)
Hemoglobin: 13.7 g/dL (ref 13.0–17.0)
MCH: 28.4 pg (ref 26.0–34.0)
MCHC: 31.9 g/dL (ref 30.0–36.0)
MCV: 89 fL (ref 80.0–100.0)
NRBC: 0 % (ref 0.0–0.2)
PLATELETS: 162 10*3/uL (ref 150–400)
RBC: 4.83 MIL/uL (ref 4.22–5.81)
RDW: 13.6 % (ref 11.5–15.5)
WBC: 6.9 10*3/uL (ref 4.0–10.5)

## 2018-10-03 LAB — HEPARIN LEVEL (UNFRACTIONATED): HEPARIN UNFRACTIONATED: 0.37 [IU]/mL (ref 0.30–0.70)

## 2018-10-03 LAB — HIV ANTIBODY (ROUTINE TESTING W REFLEX): HIV SCREEN 4TH GENERATION: NONREACTIVE

## 2018-10-03 LAB — SURGICAL PCR SCREEN
MRSA, PCR: NEGATIVE
Staphylococcus aureus: NEGATIVE

## 2018-10-03 MED ORDER — PNEUMOCOCCAL VAC POLYVALENT 25 MCG/0.5ML IJ INJ: 0.5000 mL | INJECTION | INTRAMUSCULAR | Status: DC | PRN

## 2018-10-03 MED ORDER — INFLUENZA VAC SPLIT HIGH-DOSE 0.5 ML IM SUSY: 0.5000 mL | PREFILLED_SYRINGE | INTRAMUSCULAR | Status: DC | PRN

## 2018-10-03 MED ORDER — POTASSIUM CHLORIDE CRYS ER 20 MEQ PO TBCR
40.0000 meq | EXTENDED_RELEASE_TABLET | Freq: Once | ORAL | Status: AC
  Administered 2018-10-03: 40 meq via ORAL
  Filled 2018-10-03: qty 2

## 2018-10-03 NOTE — Progress Notes (Signed)
Patient resting comfortably during shift report. Denies complaints.  

## 2018-10-03 NOTE — Plan of Care (Signed)
?  Problem: Clinical Measurements: ?Goal: Respiratory complications will improve ?Outcome: Progressing ?  ?Problem: Activity: ?Goal: Risk for activity intolerance will decrease ?Outcome: Progressing ?  ?Problem: Nutrition: ?Goal: Adequate nutrition will be maintained ?Outcome: Progressing ?  ?Problem: Coping: ?Goal: Level of anxiety will decrease ?Outcome: Progressing ?  ?

## 2018-10-03 NOTE — Progress Notes (Signed)
Progress Note  Patient Name: David [name] Date of Encounter: 10/03/2018  Primary Cardiologist: No primary care provider on file.   Subjective   Feeling well.  No chest pain or shortness of breath since prior to the hospital.  Inpatient Medications    Scheduled Meds: . amLODipine  5 mg Oral Daily  . aspirin EC  81 mg Oral Daily  . atorvastatin  80 mg Oral q1800  . sodium chloride flush  3 mL Intravenous Q12H  . sodium chloride flush  3 mL Intravenous Q12H  . tamsulosin  0.4 mg Oral QPC supper   Continuous Infusions: . sodium chloride 10 mL/hr (10/02/18 0939)  . sodium chloride    . sodium chloride    . heparin 1,200 Units/hr (10/02/18 2337)   PRN Meds: sodium chloride, sodium chloride, acetaminophen, nitroGLYCERIN, ondansetron (ZOFRAN) IV, sodium chloride flush, sodium chloride flush   Vital Signs    Vitals:   10/02/18 1632 10/02/18 2006 10/03/18 0403 10/03/18 0407  BP: (!) 137/93 132/73  130/86  Pulse: 65   60  Resp: 20     Temp: 98.8 F (37.1 C)   97.8 F (36.6 C)  TempSrc: Oral   Oral  SpO2: 95% 93%  94%  Weight:   99.3 kg   Height:        Intake/Output Summary (Last 24 hours) at 10/03/2018 0758 Last data filed at 10/03/2018 0705 Gross per 24 hour  Intake 623.71 ml  Output -  Net 623.71 ml   Filed Weights   10/02/18 0528 10/03/18 0403  Weight: 99.8 kg 99.3 kg    Telemetry    Sinus rhythm, sinus bradycardia.  Rate 50s to 70s.  PAC.  - Personally Reviewed  ECG    Sinus rhythm.  Rate 64 bpm.  PACs.  LAFB.  Nonspecific T wave abnormalities.- Personally Reviewed  Physical Exam   VS:  BP 130/86   Pulse 60   Temp 97.8 F (36.6 C) (Oral)   Resp 20   Ht 5\' 10"  (1.778 m)   Wt 99.3 kg   SpO2 94%   BMI 31.41 kg/m  , BMI Body mass index is 31.41 kg/m. GENERAL:  Well appearing HEENT: Pupils equal round and reactive, fundi not visualized, oral mucosa unremarkable NECK:  No jugular venous distention, waveform within normal limits, carotid  upstroke brisk and symmetric, no bruits LUNGS:  Clear to auscultation bilaterally HEART:  RRR.  PMI not displaced or sustained,S1 and S2 within normal limits, no S3, no S4, no clicks, no rubs, no murmurs ABD:  Flat, positive bowel sounds normal in frequency in pitch, no bruits, no rebound, no guarding, no midline pulsatile mass, no hepatomegaly, no splenomegaly EXT:  2 plus pulses throughout, no edema, no cyanosis no clubbing SKIN:  No rashes no nodules NEURO:  Cranial nerves II through XII grossly intact, motor grossly intact throughout [facility]:  Cognitively intact, oriented to person place and time   Labs    Chemistry Recent Labs  Lab 10/02/18 0520 10/03/18 0523  NA 142 142  K 3.1* 3.4*  CL 105 108  CO2 25 25  GLUCOSE 118* 111*  BUN 21 17  CREATININE 1.38* 1.15  CALCIUM 9.2 8.7*  GFRNONAA 51* >60  GFRAA 60* >60  ANIONGAP 12 9     Hematology Recent Labs  Lab 10/02/18 0520 10/03/18 0523  WBC 8.0 6.9  RBC 5.29 4.83  HGB 15.2 13.7  HCT 47.3 43.0  MCV 89.4 89.0  MCH 28.7 28.4  MCHC  32.1 31.9  RDW 13.6 13.6  PLT 186 162    Cardiac EnzymesNo results for input(s): TROPONINI in the last 168 hours.  Recent Labs  Lab 10/02/18 0525 10/02/18 0839  TROPIPOC 0.23* 0.18*     BNPNo results for input(s): BNP, PROBNP in the last 168 hours.   DDimer No results for input(s): DDIMER in the last 168 hours.   Radiology    Dg Chest 2 View  Result Date: 10/02/2018 CLINICAL DATA:  Chest pain EXAM: CHEST - 2 VIEW COMPARISON:  None. FINDINGS: The heart size and mediastinal contours are within normal limits. Both lungs are clear. The visualized skeletal structures are unremarkable. IMPRESSION: No active cardiopulmonary disease. Electronically Signed   By: Ulyses Jarred M.D.   On: 10/02/2018 05:58    Cardiac Studies   Echo 10/02/18: Study Conclusions  - Left ventricle: The cavity size was normal. Systolic function was   normal. The estimated ejection fraction was in the  range of 55%   to 60%. Mild hypokinesis of the basal-midinferior and   inferoseptal myocardium; consistent with ischemia or infarction   in the distribution of the right coronary artery. Doppler   parameters are consistent with abnormal left ventricular   relaxation (grade 1 diastolic dysfunction). - Left atrium: The atrium was mildly dilated.  LHC 10/02/18:  Severe two-vessel coronary disease with mid LAD 99% stenosis within a calcified segment.  The second diagonal contains 90% stenosis.  The diagonal is large.  Circumflex artery is normal  The RCA is large giving origin to PDA and to left ventricular branches.  It is totally occluded in the mid vessel and also distally beyond the PDA.  Both the PDA and left ventricular branches fill by right to right and left to right collaterals.    The circumflex coronary artery free of obstructive disease.  Left ventricular function overall appears normal.  EF is at least 50%.  LVEDP is normal.  RECOMMENDATIONS:   Unlikely that RCA can be completely revascularized with CTO technique given the distal total occlusion beyond the PDA.  Therefore with severe LAD disease and significant diagonal obstruction, coronary surgical revascularization has been recommended.  Would anticipate LIMA to LAD, SVG to the diagonal branch, and SVG to PDA and LV branch.  An alternative strategy if needed could be culprit LAD and diagonal PCI.  Medical therapy for the chronically occluded RCA.  Patient Profile     67 y.o. male with hypertension, hyperlipidemia, and prior tobacco abuse here with NSTEMI and found to have severe two-vessel CAD not amenable to PCI.  Assessment & Plan    # NSTEMI: # Hyperlipidemia: Plan for CABG next week per Dr. Prescott Gum.  He is clinically stable and is symptomatic at this time.  Atorvastatin was increased this admission.  He will need repeat lipids and a CMP in 6 to 8 weeks.  Continue aspirin and heparin infusion.  #  Hypertension:  BP well-controlled.  Currently on amlodipine.  His home HCTZ and benazepril are on hold.  He has had hypokalemia.  HCTZ may not be the best option for his hypertension.  He is not on a beta-blocker due to bradycardia.  For questions or updates, please contact [place] Please consult [email or web address] for contact info under        Signed, Skeet Latch, MD  10/03/2018, 7:58 AM

## 2018-10-03 NOTE — Care Management Obs Status (Signed)
[place] NOTIFICATION   Patient Details  Name: David [name] MRN: [id number] Date of Birth: [birth year]   Medicare Observation Status Notification Given:  Yes    Carles Collet, RN 10/03/2018, 11:44 AM

## 2018-10-03 NOTE — Progress Notes (Signed)
[place] for heparin Indication: chest pain/ACS  Heparin Dosing Weight: 93.8 kg  Labs: Recent Labs    10/02/18 0520 10/02/18 0830 10/03/18 0523 10/03/18 0829  HGB 15.2  --  13.7  --   HCT 47.3  --  43.0  --   PLT 186  --  162  --   APTT  --  30  --   --   LABPROT  --  14.1  --   --   INR  --  1.10  --   --   HEPARINUNFRC  --   --   --  0.37  CREATININE 1.38*  --  1.15  --     Assessment: 71 yom presenting with CP, +troponin. Pharmacy consulted to dose heparin for ACS. Not on anticoagulation PTA.   S/p cath; Severe two-vessel coronary disease with mid LAD 99% stenosis within a calcified segment. The second diagonal contains 90% stenosis.    No bleeding per nurse, Hgb stable, plts stable  Hep level=0.37  Goal of Therapy:  Heparin level 0.3-0.7 units/ml Monitor platelets by anticoagulation protocol: Yes   Plan:  Continue heparin at 1200 units/h Monitor daily heparin level/CBC Monitor s/sx bleeding  Gwenlyn Found, Sherian Rein D PGY1 Pharmacy Resident  Phone [phone] 10/03/2018   9:45 AM

## 2018-10-03 NOTE — Progress Notes (Signed)
CARDIAC REHAB PHASE I   PRE:  Rate/Rhythm: 63  BP:  Supine: 152/89 Sitting:   Standing:    SaO2: 99% RA  MODE:  Ambulation: 470 ft   POST:  Rate/Rhythm: 69  BP:  Supine: 153/86 Sitting:   Standing:    SaO2: 99% RA  [id number] Preop education complete including sternal precautions. In-the-tube handout and OHS care guide given. Pt verbalizes understanding of instructions given. Pt tolerated ambulation well without symptoms. To bed after walk, IV intact, call bell within reach.   Sol Passer, MS, ACSM CEP

## 2018-10-03 NOTE — Consult Note (Signed)
[address]       [place] [address]             [id number]        [place] P[place] Medical Record #[id number] Date of Birth: December 02, 1951  Referring: No ref. provider found Primary Care: Patient, No Pcp Per Primary Cardiologist:Mihai Croitoru, MD  Chief Complaint:   Chest pain Chief Complaint  Patient presents with  . abnormal labs  Patient examined, images of coronary angiogram and 2D echocardiogram personally reviewed and counseled with patient and wife  History of Present Illness:      Very nice 67 year old retired male admitted to the hospital after developing exertional chest pain relieved by rest while he was doing yard work earlier in the week.  He presented to a urgent care center or EKG and cardiac enzymes were performed.  The troponin was mildly elevated and he was referred to [facility] for admission and further evaluation.  His risk factors include hypertension, dyslipidemia, positive family history of CAD-MI, and remote history of smoking.  He has previously been in good health and has never been hospitalized.  Cardiac catheterization was performed by Dr. Tamala Julian which demonstrated chronic occlusion of the RCA with collateralization of the PD and PL.  The LAD diagonal had tight 90% stenosis.  LV EF was normal.  LVEDP is normal.  Echocardiogram shows normal LV systolic function, no significant valvular disease.  Dr. Tamala Julian is recommended multivessel CABG as best long-term therapy of his coronary disease.  The patient was placed on IV heparin after cardiac catheterization via right radial artery and he has had no further symptoms. Current Activity/ Functional Status: Active lifestyle, retired, lives with wife   Zubrod Score: At the time of surgery this patient's most appropriate activity status/level should be described as: []     0    Normal activity, no symptoms [x]     1    Restricted in physical strenuous activity but ambulatory, able to  do out light work []     2    Ambulatory and capable of self care, unable to do work activities, up and about                 more than 50%  Of the time                            []     3    Only limited self care, in bed greater than 50% of waking hours []     4    Completely disabled, no self care, confined to bed or chair []     5    Moribund  Past Medical History:  Diagnosis Date  . Dyslipidemia   . Essential hypertension     Past Surgical History:  Procedure Laterality Date  . LEFT HEART CATH AND CORONARY ANGIOGRAPHY N/A 10/02/2018   Procedure: LEFT HEART CATH AND CORONARY ANGIOGRAPHY;  Surgeon: Belva Crome, MD;  Location: [place] [facility];  Service: Cardiovascular;  Laterality: N/A;    Social History   Tobacco Use  Smoking Status Not on file    Social History   Substance and Sexual Activity  Alcohol Use Not on file     No Known Allergies  Current Facility-Administered Medications  Medication Dose Route Frequency Provider Last Rate Last Dose  . 0.9 %  sodium chloride infusion  250 mL Intravenous PRN Tommye Standard, Daleen Snook M.,  PA-C      . 0.9 %  sodium chloride infusion  250 mL Intravenous PRN Belva Crome, MD      . acetaminophen (TYLENOL) tablet 650 mg  650 mg Oral Q4H PRN Tommye Standard, Krista M., PA-C      . amLODipine (NORVASC) tablet 5 mg  5 mg Oral Daily Kroeger, Krista M., PA-C   5 mg at 10/03/18 0933  . aspirin EC tablet 81 mg  81 mg Oral Daily Roby Lofts M., PA-C   81 mg at 10/03/18 1950  . atorvastatin (LIPITOR) tablet 80 mg  80 mg Oral q1800 Roby Lofts M., PA-C   80 mg at 10/02/18 1829  . heparin ADULT infusion 100 units/mL (25000 units/219mL sodium chloride 0.45%)  1,200 Units/hr Intravenous Continuous Lyndee Leo, RPH 12 mL/hr at 10/03/18 0934 1,200 Units/hr at 10/03/18 0934  . nitroGLYCERIN (NITROSTAT) SL tablet 0.4 mg  0.4 mg Sublingual Q5 Min x 3 PRN Tommye Standard, Krista M., PA-C      . ondansetron [facility]) injection 4 mg  4 mg Intravenous Q6H PRN  Tommye Standard, Krista M., PA-C      . sodium chloride flush (NS) 0.9 % injection 3 mL  3 mL Intravenous Q12H Kroeger, Krista M., PA-C   3 mL at 10/02/18 2119  . sodium chloride flush (NS) 0.9 % injection 3 mL  3 mL Intravenous PRN Kroeger, Krista M., PA-C      . sodium chloride flush (NS) 0.9 % injection 3 mL  3 mL Intravenous Q12H Belva Crome, MD      . sodium chloride flush (NS) 0.9 % injection 3 mL  3 mL Intravenous PRN Belva Crome, MD      . tamsulosin [facility]) capsule 0.4 mg  0.4 mg Oral QPC supper Kroeger, Krista M., PA-C   0.4 mg at 10/02/18 1829    Medications Prior to Admission  Medication Sig Dispense Refill Last Dose  . amLODipine (NORVASC) 5 MG tablet Take 5 mg by mouth daily.    10/01/2018 at Unknown time  . aspirin EC 81 MG tablet Take 81 mg by mouth daily.   10/01/2018 at Unknown time  . atorvastatin (LIPITOR) 20 MG tablet Take 20 mg by mouth daily.  2 10/01/2018 at Unknown time  . benazepril (LOTENSIN) 40 MG tablet Take 40 mg by mouth daily.    10/01/2018 at Unknown time  . hydrochlorothiazide (HYDRODIURIL) 25 MG tablet Take 25 mg by mouth daily.    10/01/2018 at Unknown time  . tamsulosin (FLOMAX) 0.4 MG CAPS capsule Take 0.4 mg by mouth daily.  0 10/01/2018 at Unknown time    Family History  Problem Relation Age of Onset  . Heart attack Mother 24  . Heart attack Brother 50     Review of Systems:   ROS      Cardiac Review of Systems: Y or  [    ]= no  Chest Pain [  y  ]  Resting SOB [   ] Exertional SOB  [ y ]  Orthopnea [  ]   Pedal Edema [   ]    Palpitations [  ] Syncope  [  ]   Presyncope [   ]  General Review of Systems: [Y] = yes [  ]=no Constitional: recent weight change [  ]; anorexia [  ]; fatigue [  ]; nausea [  ]; night sweats [  ]; fever [  ]; or chills [  ]  Dental: Last Dentist visit: One year  Eye : blurred vision [  ]; diplopia [   ]; vision changes [  ];  Amaurosis fugax[  ]; Resp:  cough [  ];  wheezing[  ];  hemoptysis[  ]; shortness of breath[  ]; paroxysmal nocturnal dyspnea[  ]; dyspnea on exertion[  ]; or orthopnea[  ];  GI:  gallstones[  ], vomiting[  ];  dysphagia[  ]; melena[  ];  hematochezia [  ]; heartburn[y  ];   Hx of  Colonoscopy[  ]; GU: kidney stones [  ]; hematuria[  ];   dysuria [  ];  nocturia[ y ];  history of     obstruction [  ]; urinary frequency Blue.Reese  ]             Skin: rash, swelling[  ];, hair loss[  ];  peripheral edema[  ];  or itching[  ]; Musculosketetal: myalgias[  ];  joint swelling[  ];  joint erythema[  ];  joint pain[  ];  back pain[  ];  Heme/Lymph: bruising[  ];  bleeding[  ];  anemia[  ];  Neuro: TIA[  ];  headaches[  ];  stroke[  ];  vertigo[  ];  seizures[  ];   paresthesias[  ];  difficulty walking[  ];  Psych:depression[  ]; anxiety[  ];  Endocrine: diabetes[  ];  thyroid dysfunction[  ];                 Physical Exam: BP (!) 135/96   Pulse 60   Temp 97.8 F (36.6 C) (Oral)   Resp 20   Ht 5\' 10"  (1.778 m)   Wt 99.3 kg   SpO2 94%   BMI 31.41 kg/m        Physical Exam  General: Well-nourished healthy-appearing middle-aged Caucasian male no acute distress HEENT: Normocephalic pupils equal , dentition adequate Neck: Supple without JVD, adenopathy, or bruit Chest: Clear to auscultation, symmetrical breath sounds, no rhonchi, no tenderness             or deformity Cardiovascular: Regular rate and rhythm, no murmur, no gallop, peripheral pulses             palpable in all extremities Abdomen:  Soft, nontender, no palpable mass or organomegaly Extremities: Warm, well-perfused, no clubbing cyanosis edema or tenderness,              no venous stasis changes of the legs Rectal/GU: Deferred Neuro: Grossly non--focal and symmetrical throughout Skin: Clean and dry without rash or ulceration   Diagnostic Studies & Laboratory data:     Recent Radiology Findings:   Dg Chest 2 View  Result Date: 10/02/2018 CLINICAL DATA:   Chest pain EXAM: CHEST - 2 VIEW COMPARISON:  None. FINDINGS: The heart size and mediastinal contours are within normal limits. Both lungs are clear. The visualized skeletal structures are unremarkable. IMPRESSION: No active cardiopulmonary disease. Electronically Signed   By: Ulyses Jarred M.D.   On: 10/02/2018 05:58     I have independently reviewed the above radiologic studies and discussed with the patient   Recent Lab Findings: Lab Results  Component Value Date   WBC 6.9 10/03/2018   HGB 13.7 10/03/2018   HCT 43.0 10/03/2018   PLT 162 10/03/2018   GLUCOSE 111 (H) 10/03/2018   CHOL 140 10/02/2018   TRIG 74 10/02/2018   HDL 34 (L) 10/02/2018   LDLCALC 91 10/02/2018   NA 142 10/03/2018  K 3.4 (L) 10/03/2018   CL 108 10/03/2018   CREATININE 1.15 10/03/2018   BUN 17 10/03/2018   CO2 25 10/03/2018   INR 1.10 10/02/2018   HGBA1C 5.8 (H) 10/02/2018      Assessment / Plan:   Unstable angina, non-STEMI, severe three-vessel CAD with preserved LV systolic function  Patient will be prepared for multivessel CABG on a.m. Tuesday, October 15.  I discussed the procedure CABG in detail with the patient and his wife including the expected benefits, potential risks, and the expected postoperative hospital recovery.  He agrees to proceed with surgery as planned.     Dahlia Byes MD 10/03/2018 10:09 AM

## 2018-10-04 ENCOUNTER — Inpatient Hospital Stay (HOSPITAL_COMMUNITY): Payer: Medicare Other

## 2018-10-04 DIAGNOSIS — Z01810 Encounter for preprocedural cardiovascular examination: Secondary | ICD-10-CM

## 2018-10-04 LAB — CBC
HEMATOCRIT: 44.3 % (ref 39.0–52.0)
Hemoglobin: 14.3 g/dL (ref 13.0–17.0)
MCH: 28.7 pg (ref 26.0–34.0)
MCHC: 32.3 g/dL (ref 30.0–36.0)
MCV: 88.8 fL (ref 80.0–100.0)
PLATELETS: 165 10*3/uL (ref 150–400)
RBC: 4.99 MIL/uL (ref 4.22–5.81)
RDW: 13.6 % (ref 11.5–15.5)
WBC: 7.6 10*3/uL (ref 4.0–10.5)
nRBC: 0 % (ref 0.0–0.2)

## 2018-10-04 LAB — PROTIME-INR
INR: 1.1
Prothrombin Time: 14.1 seconds (ref 11.4–15.2)

## 2018-10-04 LAB — BASIC METABOLIC PANEL
Anion gap: 8 (ref 5–15)
BUN: 15 mg/dL (ref 8–23)
CALCIUM: 8.8 mg/dL — AB (ref 8.9–10.3)
CHLORIDE: 109 mmol/L (ref 98–111)
CO2: 24 mmol/L (ref 22–32)
CREATININE: 1.15 mg/dL (ref 0.61–1.24)
GFR calc non Af Amer: >60 mL/min (ref >60)
GLUCOSE: 110 mg/dL — AB (ref 70–99)
Potassium: 3.7 mmol/L (ref 3.5–5.1)
Sodium: 141 mmol/L (ref 135–145)

## 2018-10-04 LAB — HEPATIC FUNCTION PANEL
ALT: 19 U/L (ref 0–44)
AST: 20 U/L (ref 15–41)
Albumin: 3.8 g/dL (ref 3.5–5.0)
Alkaline Phosphatase: 43 U/L (ref 38–126)
Bilirubin, Direct: 0.2 mg/dL (ref 0.0–0.2)
Indirect Bilirubin: 0.9 mg/dL (ref 0.3–0.9)
Total Bilirubin: 1.1 mg/dL (ref 0.3–1.2)
Total Protein: 6.5 g/dL (ref 6.5–8.1)

## 2018-10-04 LAB — HEPARIN LEVEL (UNFRACTIONATED): Heparin Unfractionated: 0.34 IU/mL (ref 0.30–0.70)

## 2018-10-04 LAB — TSH: TSH: 7.17 u[IU]/mL — ABNORMAL HIGH (ref 0.350–4.500)

## 2018-10-04 MED ORDER — BENAZEPRIL HCL 20 MG PO TABS
20.0000 mg | ORAL_TABLET | Freq: Every day | ORAL | Status: DC
  Administered 2018-10-04 – 2018-10-05 (×2): 20 mg via ORAL
  Filled 2018-10-04 (×3): qty 1

## 2018-10-04 NOTE — Progress Notes (Signed)
Patient resting comfortably during shift report. Denies complaints.  

## 2018-10-04 NOTE — Progress Notes (Signed)
Pre-op Cardiac Surgery  Carotid Findings:   ICA 1-39% stenosis bilaterally   Upper Extremity Right Left  Brachial Pressures 140 140  Radial Waveforms triphasic triphasic  Ulnar Waveforms triphasic triphasic  Palmar Arch (Allen's Test) wnl wnl    Lower  Extremity Right Left  Dorsalis Pedis triphasic triphasic      Posterior Tibial triphasic triphasic       Abram Sander 10/04/2018 11:27 AM

## 2018-10-04 NOTE — Progress Notes (Addendum)
EKG requested per Q AM order.

## 2018-10-04 NOTE — Progress Notes (Signed)
Progress Note  Patient Name: David [name] Date of Encounter: 10/04/2018  Primary Cardiologist: Sanda Klein, MD   Subjective   Feeling well.  No chest pain or shortness of breath.  Ambulating without difficulty.  Inpatient Medications    Scheduled Meds: . amLODipine  5 mg Oral Daily  . aspirin EC  81 mg Oral Daily  . atorvastatin  80 mg Oral q1800  . sodium chloride flush  3 mL Intravenous Q12H  . sodium chloride flush  3 mL Intravenous Q12H  . tamsulosin  0.4 mg Oral QPC supper   Continuous Infusions: . sodium chloride    . sodium chloride    . heparin 1,200 Units/hr (10/03/18 2045)   PRN Meds: sodium chloride, sodium chloride, acetaminophen, Influenza vac split quadrivalent PF, nitroGLYCERIN, ondansetron (ZOFRAN) IV, pneumococcal 23 valent vaccine, sodium chloride flush, sodium chloride flush   Vital Signs    Vitals:   10/03/18 0933 10/03/18 1107 10/03/18 2142 10/04/18 0606  BP: (!) 135/96 (!) 141/85 126/82 139/88  Pulse:  62 60 (!) 58  Resp:  18 18 18   Temp:  98.4 F (36.9 C) 98.7 F (37.1 C) 98.2 F (36.8 C)  TempSrc:  Oral Oral Oral  SpO2:  94% 95% 95%  Weight:    98.9 kg  Height:        Intake/Output Summary (Last 24 hours) at 10/04/2018 0842 Last data filed at 10/04/2018 0715 Gross per 24 hour  Intake 529.89 ml  Output 700 ml  Net -170.11 ml   Filed Weights   10/02/18 0528 10/03/18 0403 10/04/18 0606  Weight: 99.8 kg 99.3 kg 98.9 kg    Telemetry    Sinus rhythm, sinus bradycardia.  Rate 50s to 70s.  PAC.  - Personally Reviewed  ECG    Sinus rhythm.  Rate 64 bpm.  PACs.  LAFB.  Nonspecific T wave abnormalities.- Personally Reviewed  Physical Exam   VS:  BP 139/88   Pulse (!) 58   Temp 98.2 F (36.8 C) (Oral)   Resp 18   Ht 5\' 10"  (1.778 m)   Wt 98.9 kg   SpO2 95%   BMI 31.28 kg/m  , BMI Body mass index is 31.28 kg/m. GENERAL:  Well appearing HEENT: Pupils equal round and reactive, fundi not visualized, oral mucosa  unremarkable NECK:  No jugular venous distention, waveform within normal limits, carotid upstroke brisk and symmetric, no bruits, no thyromegaly LYMPHATICS:  No cervical adenopathy LUNGS:  Clear to auscultation bilaterally HEART:  RRR.  PMI not displaced or sustained,S1 and S2 within normal limits, no S3, no S4, no clicks, no rubs, no murmurs ABD:  Flat, positive bowel sounds normal in frequency in pitch, no bruits, no rebound, no guarding, no midline pulsatile mass, no hepatomegaly, no splenomegaly EXT:  2 plus pulses throughout, no edema, no cyanosis no clubbing SKIN:  No rashes no nodules NEURO:  Cranial nerves II through XII grossly intact, motor grossly intact throughout [facility]:  Cognitively intact, oriented to person place and time    Labs    Chemistry Recent Labs  Lab 10/02/18 0520 10/03/18 0523 10/04/18 0421  NA 142 142 141  K 3.1* 3.4* 3.7  CL 105 108 109  CO2 25 25 24   GLUCOSE 118* 111* 110*  BUN 21 17 15   CREATININE 1.38* 1.15 1.15  CALCIUM 9.2 8.7* 8.8*  PROT  --   --  6.5  ALBUMIN  --   --  3.8  AST  --   --  20  ALT  --   --  19  ALKPHOS  --   --  43  BILITOT  --   --  1.1  GFRNONAA 51* >60 >60  GFRAA 60* >60 >60  ANIONGAP 12 9 8      Hematology Recent Labs  Lab 10/02/18 0520 10/03/18 0523 10/04/18 0421  WBC 8.0 6.9 7.6  RBC 5.29 4.83 4.99  HGB 15.2 13.7 14.3  HCT 47.3 43.0 44.3  MCV 89.4 89.0 88.8  MCH 28.7 28.4 28.7  MCHC 32.1 31.9 32.3  RDW 13.6 13.6 13.6  PLT 186 162 165    Cardiac EnzymesNo results for input(s): TROPONINI in the last 168 hours.  Recent Labs  Lab 10/02/18 0525 10/02/18 0839  TROPIPOC 0.23* 0.18*     BNPNo results for input(s): BNP, PROBNP in the last 168 hours.   DDimer No results for input(s): DDIMER in the last 168 hours.   Radiology    No results found.  Cardiac Studies   Echo 10/02/18: Study Conclusions  - Left ventricle: The cavity size was normal. Systolic function was   normal. The estimated  ejection fraction was in the range of 55%   to 60%. Mild hypokinesis of the basal-midinferior and   inferoseptal myocardium; consistent with ischemia or infarction   in the distribution of the right coronary artery. Doppler   parameters are consistent with abnormal left ventricular   relaxation (grade 1 diastolic dysfunction). - Left atrium: The atrium was mildly dilated.  LHC 10/02/18:  Severe two-vessel coronary disease with mid LAD 99% stenosis within a calcified segment.  The second diagonal contains 90% stenosis.  The diagonal is large.  Circumflex artery is normal  The RCA is large giving origin to PDA and to left ventricular branches.  It is totally occluded in the mid vessel and also distally beyond the PDA.  Both the PDA and left ventricular branches fill by right to right and left to right collaterals.    The circumflex coronary artery free of obstructive disease.  Left ventricular function overall appears normal.  EF is at least 50%.  LVEDP is normal.  RECOMMENDATIONS:   Unlikely that RCA can be completely revascularized with CTO technique given the distal total occlusion beyond the PDA.  Therefore with severe LAD disease and significant diagonal obstruction, coronary surgical revascularization has been recommended.  Would anticipate LIMA to LAD, SVG to the diagonal branch, and SVG to PDA and LV branch.  An alternative strategy if needed could be culprit LAD and diagonal PCI.  Medical therapy for the chronically occluded RCA.  Patient Profile     67 y.o. male with hypertension, hyperlipidemia, and prior tobacco abuse here with NSTEMI and found to have severe two-vessel CAD not amenable to PCI.  Assessment & Plan    # NSTEMI: # Hyperlipidemia: Plan for CABG Tuesday with Dr. Prescott Gum.  He is clinically stable and is symptomatic at this time.  Atorvastatin was increased this admission.  He will need repeat lipids and a CMP in 6 to 8 weeks.  Continue aspirin and heparin  infusion.  # Hypertension:  BP above goal.  Currently on amlodipine.  His home HCTZ and benazepril are on hold.  He has had hypokalemia.  HCTZ may not be the best option for his hypertension.  He is not on a beta-blocker due to bradycardia.  We will add back benazepril 20mg  daily.  He was on 40mg  at home.   For questions or updates, please contact [facility] Please consult [email or web address] for contact info under        Signed, Skeet Latch, MD  10/04/2018, 8:42 AM

## 2018-10-04 NOTE — Progress Notes (Signed)
[place] for heparin Indication: chest pain/ACS  Heparin Dosing Weight: 93.8 kg  Labs: Recent Labs    10/02/18 0520 10/02/18 0830 10/03/18 0523 10/03/18 0829 10/04/18 0421  HGB 15.2  --  13.7  --  14.3  HCT 47.3  --  43.0  --  44.3  PLT 186  --  162  --  165  APTT  --  30  --   --   --   LABPROT  --  14.1  --   --  14.1  INR  --  1.10  --   --  1.10  HEPARINUNFRC  --   --   --  0.37 0.34  CREATININE 1.38*  --  1.15  --  1.15    Assessment: 35 yom presenting with CP, +troponin. Pharmacy consulted to dose heparin for ACS. Not on anticoagulation PTA.   S/p cath; Severe two-vessel coronary disease with mid LAD 99% stenosis within a calcified segment. The second diagonal contains 90% stenosis.    No bleeding per nurse, CBC stable Hep level=0.34  Goal of Therapy:  Heparin level 0.3-0.7 units/ml Monitor platelets by anticoagulation protocol: Yes   Plan:  Continue heparin at 1200 units/hr Monitor daily heparin level/CBC Monitor s/sx bleeding  Gwenlyn Found, Sherian Rein D PGY1 Pharmacy Resident  Phone [phone] 10/04/2018   7:22 AM

## 2018-10-05 ENCOUNTER — Inpatient Hospital Stay (HOSPITAL_COMMUNITY): Payer: Medicare Other

## 2018-10-05 LAB — BASIC METABOLIC PANEL
Anion gap: 9 (ref 5–15)
BUN: 14 mg/dL (ref 8–23)
CALCIUM: 8.8 mg/dL — AB (ref 8.9–10.3)
CO2: 24 mmol/L (ref 22–32)
CREATININE: 1.1 mg/dL (ref 0.61–1.24)
Chloride: 109 mmol/L (ref 98–111)
GFR calc non Af Amer: >60 mL/min (ref >60)
GLUCOSE: 101 mg/dL — AB (ref 70–99)
Potassium: 3.7 mmol/L (ref 3.5–5.1)
Sodium: 142 mmol/L (ref 135–145)

## 2018-10-05 LAB — CBC
HCT: 44.7 % (ref 39.0–52.0)
Hemoglobin: 14.4 g/dL (ref 13.0–17.0)
MCH: 28.8 pg (ref 26.0–34.0)
MCHC: 32.2 g/dL (ref 30.0–36.0)
MCV: 89.4 fL (ref 80.0–100.0)
PLATELETS: 161 10*3/uL (ref 150–400)
RBC: 5 MIL/uL (ref 4.22–5.81)
RDW: 13.8 % (ref 11.5–15.5)
WBC: 8.3 10*3/uL (ref 4.0–10.5)
nRBC: 0 % (ref 0.0–0.2)

## 2018-10-05 LAB — PULMONARY FUNCTION TEST
DL/VA % pred: 81 %
DL/VA: 3.74 ml/min/mmHg/L
DLCO cor % pred: 89 %
DLCO cor: 28.84 ml/min/mmHg
DLCO unc % pred: 88 %
DLCO unc: 28.68 ml/min/mmHg
FEF 25-75 Post: 5.93 L/sec
FEF 25-75 Pre: 5.73 L/sec
FEF2575-%Change-Post: 3 %
FEF2575-%Pred-Post: 226 %
FEF2575-%Pred-Pre: 218 %
FEV1-%Change-Post: 1 %
FEV1-%Pred-Post: 149 %
FEV1-%Pred-Pre: 146 %
FEV1-Post: 5.02 L
FEV1-Pre: 4.93 L
FEV1FVC-%Change-Post: 0 %
FEV1FVC-%Pred-Pre: 113 %
FEV6-%Change-Post: 1 %
FEV6-%Pred-Post: 138 %
FEV6-%Pred-Pre: 136 %
FEV6-Post: 5.96 L
FEV6-Pre: 5.86 L
FEV6FVC-%Change-Post: 0 %
FEV6FVC-%Pred-Post: 104 %
FEV6FVC-%Pred-Pre: 104 %
FVC-%Change-Post: 1 %
FVC-%Pred-Post: 132 %
FVC-%Pred-Pre: 130 %
FVC-Post: 5.99 L
FVC-Pre: 5.89 L
Post FEV1/FVC ratio: 84 %
Post FEV6/FVC ratio: 99 %
Pre FEV1/FVC ratio: 84 %
Pre FEV6/FVC Ratio: 99 %
RV % pred: 108 %
RV: 2.59 L
TLC % pred: 125 %
TLC: 8.8 L

## 2018-10-05 LAB — BLOOD GAS, ARTERIAL
Acid-base deficit: 0.1 mmol/L (ref 0.0–2.0)
Bicarbonate: 22.9 mmol/L (ref 20.0–28.0)
DRAWN BY: 511911
FIO2: 21
O2 Saturation: 93.6 %
PCO2 ART: 30.7 mmHg — AB (ref 32.0–48.0)
PO2 ART: 65 mmHg — AB (ref 83.0–108.0)
Patient temperature: 98.6
pH, Arterial: 7.486 — ABNORMAL HIGH (ref 7.350–7.450)

## 2018-10-05 LAB — ABO/RH: ABO/RH(D): O POS

## 2018-10-05 LAB — HEPARIN LEVEL (UNFRACTIONATED): Heparin Unfractionated: 0.34 IU/mL (ref 0.30–0.70)

## 2018-10-05 LAB — PREPARE RBC (CROSSMATCH)

## 2018-10-05 MED ORDER — TRANEXAMIC ACID (OHS) BOLUS VIA INFUSION
15.0000 mg/kg | INTRAVENOUS | Status: AC
  Administered 2018-10-06: 1470 mg via INTRAVENOUS
  Filled 2018-10-05: qty 1470

## 2018-10-05 MED ORDER — NOREPINEPHRINE 4 MG/250ML-% IV SOLN
0.0000 ug/min | INTRAVENOUS | Status: DC
  Filled 2018-10-05: qty 250

## 2018-10-05 MED ORDER — INSULIN REGULAR(HUMAN) IN NACL 100-0.9 UT/100ML-% IV SOLN
INTRAVENOUS | Status: AC
  Administered 2018-10-06: 1 [IU]/h via INTRAVENOUS
  Filled 2018-10-05: qty 100

## 2018-10-05 MED ORDER — MILRINONE LACTATE IN DEXTROSE 20-5 MG/100ML-% IV SOLN
0.3000 ug/kg/min | INTRAVENOUS | Status: DC
  Filled 2018-10-05: qty 100

## 2018-10-05 MED ORDER — POTASSIUM CHLORIDE 2 MEQ/ML IV SOLN
80.0000 meq | INTRAVENOUS | Status: DC
  Filled 2018-10-05: qty 40

## 2018-10-05 MED ORDER — VANCOMYCIN HCL 10 G IV SOLR
1500.0000 mg | INTRAVENOUS | Status: AC
  Administered 2018-10-06: 1500 mg via INTRAVENOUS
  Filled 2018-10-05: qty 1500

## 2018-10-05 MED ORDER — NITROGLYCERIN IN D5W 200-5 MCG/ML-% IV SOLN
2.0000 ug/min | INTRAVENOUS | Status: DC
  Filled 2018-10-05: qty 250

## 2018-10-05 MED ORDER — DEXMEDETOMIDINE HCL IN NACL 400 MCG/100ML IV SOLN
0.1000 ug/kg/h | INTRAVENOUS | Status: AC
  Administered 2018-10-06: .5 ug/kg/h via INTRAVENOUS
  Filled 2018-10-05: qty 100

## 2018-10-05 MED ORDER — ALBUTEROL SULFATE (2.5 MG/3ML) 0.083% IN NEBU
2.5000 mg | INHALATION_SOLUTION | Freq: Once | RESPIRATORY_TRACT | Status: AC
  Administered 2018-10-05: 2.5 mg via RESPIRATORY_TRACT

## 2018-10-05 MED ORDER — EPINEPHRINE PF 1 MG/ML IJ SOLN
0.0000 ug/min | INTRAVENOUS | Status: DC
  Filled 2018-10-05: qty 4

## 2018-10-05 MED ORDER — CHLORHEXIDINE GLUCONATE 0.12 % MT SOLN
15.0000 mL | Freq: Once | OROMUCOSAL | Status: AC
  Administered 2018-10-06: 15 mL via OROMUCOSAL
  Filled 2018-10-05: qty 15

## 2018-10-05 MED ORDER — CHLORHEXIDINE GLUCONATE 4 % EX LIQD
60.0000 mL | Freq: Once | CUTANEOUS | Status: AC
  Administered 2018-10-06: 4 via TOPICAL
  Filled 2018-10-05: qty 60

## 2018-10-05 MED ORDER — MAGNESIUM SULFATE 50 % IJ SOLN
40.0000 meq | INTRAMUSCULAR | Status: DC
  Filled 2018-10-05: qty 9.85

## 2018-10-05 MED ORDER — SODIUM CHLORIDE 0.9 % IV SOLN
INTRAVENOUS | Status: DC
  Filled 2018-10-05: qty 30

## 2018-10-05 MED ORDER — SODIUM CHLORIDE 0.9 % IV SOLN
1.5000 g | INTRAVENOUS | Status: AC
  Administered 2018-10-06: 1.5 g via INTRAVENOUS
  Filled 2018-10-05: qty 1.5

## 2018-10-05 MED ORDER — TRANEXAMIC ACID (OHS) PUMP PRIME SOLUTION
2.0000 mg/kg | INTRAVENOUS | Status: DC
  Filled 2018-10-05: qty 1.96

## 2018-10-05 MED ORDER — SODIUM CHLORIDE 0.9 % IV SOLN
750.0000 mg | INTRAVENOUS | Status: DC
  Filled 2018-10-05: qty 750

## 2018-10-05 MED ORDER — BISACODYL 5 MG PO TBEC
5.0000 mg | DELAYED_RELEASE_TABLET | Freq: Once | ORAL | Status: AC
  Administered 2018-10-05: 5 mg via ORAL
  Filled 2018-10-05: qty 1

## 2018-10-05 MED ORDER — ALPRAZOLAM 0.25 MG PO TABS
0.2500 mg | ORAL_TABLET | ORAL | Status: DC | PRN
  Administered 2018-10-05: 0.5 mg via ORAL
  Filled 2018-10-05: qty 2

## 2018-10-05 MED ORDER — PLASMA-LYTE 148 IV SOLN
INTRAVENOUS | Status: AC
  Administered 2018-10-06: 500 mL
  Filled 2018-10-05: qty 2.5

## 2018-10-05 MED ORDER — DOPAMINE-DEXTROSE 3.2-5 MG/ML-% IV SOLN
0.0000 ug/kg/min | INTRAVENOUS | Status: DC
  Filled 2018-10-05: qty 250

## 2018-10-05 MED ORDER — PHENYLEPHRINE HCL-NACL 20-0.9 MG/250ML-% IV SOLN
30.0000 ug/min | INTRAVENOUS | Status: DC
  Filled 2018-10-05: qty 250

## 2018-10-05 MED ORDER — METOPROLOL TARTRATE 12.5 MG HALF TABLET
12.5000 mg | ORAL_TABLET | Freq: Once | ORAL | Status: AC
  Administered 2018-10-06: 12.5 mg via ORAL
  Filled 2018-10-05: qty 1

## 2018-10-05 MED ORDER — TEMAZEPAM 15 MG PO CAPS
15.0000 mg | ORAL_CAPSULE | Freq: Once | ORAL | Status: AC | PRN
  Administered 2018-10-05: 15 mg via ORAL
  Filled 2018-10-05: qty 1

## 2018-10-05 MED ORDER — CHLORHEXIDINE GLUCONATE 4 % EX LIQD
60.0000 mL | Freq: Once | CUTANEOUS | Status: AC
  Administered 2018-10-05: 4 via TOPICAL
  Filled 2018-10-05: qty 60

## 2018-10-05 MED ORDER — TRANEXAMIC ACID 1000 MG/10ML IV SOLN
1.5000 mg/kg/h | INTRAVENOUS | Status: AC
  Administered 2018-10-06: 1.5 mg/kg/h via INTRAVENOUS
  Filled 2018-10-05: qty 25

## 2018-10-05 MED ORDER — DIAZEPAM 5 MG PO TABS
5.0000 mg | ORAL_TABLET | Freq: Once | ORAL | Status: AC
  Administered 2018-10-06: 5 mg via ORAL
  Filled 2018-10-05: qty 1

## 2018-10-05 NOTE — Progress Notes (Signed)
CARDIAC REHAB PHASE I   PRE:  Rate/Rhythm: 58 SB  BP:  Supine: 147/82  Sitting:   Standing:    SaO2: 96%RA  MODE:  Ambulation: 800 ft   POST:  Rate/Rhythm: 59  BP:  Supine: 151/89  Sitting:   Standing:    SaO2: 98%RA 1007-1042 Pt walked 800 ft on RA with steady gait and no CP. Gave IS and pt demonstrated 2500 ml correctly. Pre op ed reviewed with pt and wife as wife stated not here when reviewed Saturday. Wife will be available 24/7 after discharge to assist with pt's care. Discussed staying in the tube and sternal precautions.   Graylon Good, RN BSN  10/05/2018 10:38 AM

## 2018-10-05 NOTE — Progress Notes (Signed)
Progress Note  Patient Name: David [name] Date of Encounter: 10/05/2018  Primary Cardiologist: Sanda Klein, MD    Subjective   67 year old gentleman with a history of hypertension, hyperlipidemia, prior tobacco abuse who presented with a non-ST segment elevation myocardial infarction.  Heart catheterization revealed severe two-vessel disease and he is been scheduled for coronary artery bypass grafting tomorrow.  Inpatient Medications    Scheduled Meds: . amLODipine  5 mg Oral Daily  . aspirin EC  81 mg Oral Daily  . atorvastatin  80 mg Oral q1800  . benazepril  20 mg Oral Daily  . chlorhexidine  60 mL Topical Once   And  . [START ON 10/06/2018] chlorhexidine  60 mL Topical Once  . [START ON 10/06/2018] chlorhexidine  15 mL Mouth/Throat Once  . [START ON 10/06/2018] diazepam  5 mg Oral Once  . [START ON 10/06/2018] metoprolol tartrate  12.5 mg Oral Once  . sodium chloride flush  3 mL Intravenous Q12H  . sodium chloride flush  3 mL Intravenous Q12H  . tamsulosin  0.4 mg Oral QPC supper   Continuous Infusions: . sodium chloride    . sodium chloride    . heparin 1,200 Units/hr (10/05/18 0802)   PRN Meds: sodium chloride, sodium chloride, acetaminophen, ALPRAZolam, Influenza vac split quadrivalent PF, nitroGLYCERIN, ondansetron (ZOFRAN) IV, pneumococcal 23 valent vaccine, sodium chloride flush, sodium chloride flush, temazepam   Vital Signs    Vitals:   10/04/18 1515 10/04/18 2142 10/05/18 0400 10/05/18 0841  BP: 119/70 120/73  132/87  Pulse: 63 60  (!) 57  Resp: 18 18    Temp: 98.5 F (36.9 C) 98.2 F (36.8 C)    TempSrc: Oral Oral    SpO2: 96% 94%  98%  Weight:  98 kg 98 kg   Height:        Intake/Output Summary (Last 24 hours) at 10/05/2018 1024 Last data filed at 10/05/2018 0852 Gross per 24 hour  Intake 757.24 ml  Output 925 ml  Net -167.76 ml   Filed Weights   10/04/18 0606 10/04/18 2142 10/05/18 0400  Weight: 98.9 kg 98 kg 98 kg    Telemetry      NSR  - Personally Reviewed  ECG     - Personally Reviewed  Physical Exam   GEN: No acute distress.   Neck: No JVD Cardiac: RRR,  Soft systolic murmur  Respiratory: Clear to auscultation bilaterally. GI: Soft, nontender, non-distended  MS: No edema; No deformity. Neuro:  Nonfocal  Psych: Normal affect   Labs    Chemistry Recent Labs  Lab 10/03/18 0523 10/04/18 0421 10/05/18 0438  NA 142 141 142  K 3.4* 3.7 3.7  CL 108 109 109  CO2 25 24 24   GLUCOSE 111* 110* 101*  BUN 17 15 14   CREATININE 1.15 1.15 1.10  CALCIUM 8.7* 8.8* 8.8*  PROT  --  6.5  --   ALBUMIN  --  3.8  --   AST  --  20  --   ALT  --  19  --   ALKPHOS  --  43  --   BILITOT  --  1.1  --   GFRNONAA >60 >60 >60  GFRAA >60 >60 >60  ANIONGAP 9 8 9      Hematology Recent Labs  Lab 10/03/18 0523 10/04/18 0421 10/05/18 0438  WBC 6.9 7.6 8.3  RBC 4.83 4.99 5.00  HGB 13.7 14.3 14.4  HCT 43.0 44.3 44.7  MCV 89.0 88.8 89.4  MCH 28.4 28.7 28.8  MCHC 31.9 32.3 32.2  RDW 13.6 13.6 13.8  PLT 162 165 161    Cardiac EnzymesNo results for input(s): TROPONINI in the last 168 hours.  Recent Labs  Lab 10/02/18 0525 10/02/18 0839  TROPIPOC 0.23* 0.18*     BNPNo results for input(s): BNP, PROBNP in the last 168 hours.   DDimer No results for input(s): DDIMER in the last 168 hours.   Radiology    No results found.  Cardiac Studies      Patient Profile     67 y.o. male with severe coronary artery disease.  Is scheduled for coronary artery bypass grafting tomorrow.  Assessment & Plan    1.  Coronary artery disease: The patient presents with a non-ST segment elevation myocardial infarction.  Heart catheterization ~mid LAD stenosis of 99%.  The second diagonal artery has a 90% stenosis.  The right coronary artery is occluded at its midpoint.  He is scheduled for coronary artery bypass grafting tomorrow.  For questions or updates, please contact [place] Please consult [email or web address]  for contact info under        Signed, Mertie Moores, MD  10/05/2018, 10:24 AM

## 2018-10-05 NOTE — Progress Notes (Signed)
ANTICOAGULATION CONSULT NOTE  Pharmacy Consult for heparin Indication: chest pain/ACS  Heparin Dosing Weight: 93.8 kg  Labs: Recent Labs    10/02/18 0830  10/03/18 0523 10/03/18 0829 10/04/18 0421 10/05/18 0438  HGB  --    < > 13.7  --  14.3 14.4  HCT  --   --  43.0  --  44.3 44.7  PLT  --   --  162  --  165 161  APTT 30  --   --   --   --   --   LABPROT 14.1  --   --   --  14.1  --   INR 1.10  --   --   --  1.10  --   HEPARINUNFRC  --   --   --  0.37 0.34 0.34  CREATININE  --   --  1.15  --  1.15 1.10   < > = values in this interval not displayed.    Assessment: 58 yom presenting with CP, +troponin. Pharmacy consulted to dose heparin for ACS. Not on anticoagulation PTA.   S/p cath; Severe two-vessel coronary disease with mid LAD 99% stenosis within a calcified segment. The second diagonal contains 90% stenosis.    No bleeding per nurse, CBC stable Hep level=0.34  Goal of Therapy:  Heparin level 0.3-0.7 units/ml Monitor platelets by anticoagulation protocol: Yes   Plan:  Continue heparin at 1200 units/hr Monitor daily heparin level/CBC Monitor s/sx bleeding  Levester Fresh, PharmD, BCPS, BCCCP Clinical Pharmacist [phone]  Please check AMION for all [place] numbers  10/05/2018 8:21 AM

## 2018-10-05 NOTE — Progress Notes (Signed)
3 Days Post-Op Procedure(s) (LRB): LEFT HEART CATH AND CORONARY ANGIOGRAPHY (N/A) Subjective: Patient stable without chest pain on IV heparin Preop studies have been completed-carotid Doppler showed no significant disease and PFTs show adequate pulmonary reserve for sternotomy-CABG  Objective: Vital signs in last 24 hours: Temp:  [98.2 F (36.8 C)-98.3 F (36.8 C)] 98.3 F (36.8 C) (10/14 1543) Pulse Rate:  [57-62] 62 (10/14 1543) Cardiac Rhythm: Normal sinus rhythm (10/14 0853) Resp:  [18] 18 (10/14 1543) BP: (120-132)/(73-87) 122/77 (10/14 1543) SpO2:  [94 %-98 %] 96 % (10/14 1543) Weight:  [98 kg] 98 kg (10/14 0400)  Hemodynamic parameters for last 24 hours:  Stable  Intake/Output from previous day: 10/13 0701 - 10/14 0700 In: 663.7 [P.O.:220; I.V.:443.7] Out: 925 [Urine:925] Intake/Output this shift: Total I/O In: 516.4 [P.O.:480; I.V.:36.4] Out: -   Patient alert and comfortable Lungs clear Heart rate regular No hematoma at cardiac cath site Neuro intact   Lab Results: Recent Labs    10/04/18 0421 10/05/18 0438  WBC 7.6 8.3  HGB 14.3 14.4  HCT 44.3 44.7  PLT 165 161   BMET:  Recent Labs    10/04/18 0421 10/05/18 0438  NA 141 142  K 3.7 3.7  CL 109 109  CO2 24 24  GLUCOSE 110* 101*  BUN 15 14  CREATININE 1.15 1.10  CALCIUM 8.8* 8.8*    PT/INR:  Recent Labs    10/04/18 0421  LABPROT 14.1  INR 1.10   ABG No results found for: PHART, HCO3, TCO2, ACIDBASEDEF, O2SAT CBG (last 3)  No results for input(s): GLUCAP in the last 72 hours.  Assessment/Plan: S/P Procedure(s) (LRB): LEFT HEART CATH AND CORONARY ANGIOGRAPHY (N/A) Plan multivessel CABG in a.m. for unstable angina, preop non-STEMI.  Procedure benefits and risks have been discussed with patient and wife.  He understands and agrees to proceed.   LOS: 2 days    Tharon Aquas Trigt III 10/05/2018

## 2018-10-05 NOTE — Anesthesia Preprocedure Evaluation (Addendum)
Anesthesia Evaluation  Patient identified by MRN, date of birth, ID band Patient awake    Reviewed: Allergy & Precautions, H&P , NPO status , Patient's Chart, lab work & pertinent test results  Airway Mallampati: II  TM Distance: >3 FB Neck ROM: Full    Dental no notable dental hx. (+) Teeth Intact, Dental Advisory Given   Pulmonary neg pulmonary ROS,    Pulmonary exam normal breath sounds clear to auscultation       Cardiovascular Exercise Tolerance: Good hypertension, Pt. on medications + angina + CAD   Rhythm:Regular Rate:Normal     Neuro/Psych negative neurological ROS  negative psych ROS   GI/Hepatic negative GI ROS, Neg liver ROS,   Endo/Other  negative endocrine ROS  Renal/GU negative Renal ROS  negative genitourinary   Musculoskeletal   Abdominal   Peds  Hematology negative hematology ROS (+)   Anesthesia Other Findings   Reproductive/Obstetrics negative OB ROS                            Anesthesia Physical Anesthesia Plan  ASA: IV  Anesthesia Plan: General   Post-op Pain Management:    Induction: Intravenous  PONV Risk Score and Plan: 3 and Midazolam and Treatment may vary due to age or medical condition  Airway Management Planned: Oral ETT  Additional Equipment: Arterial line, CVP, PA Cath, TEE and Ultrasound Guidance Line Placement  Intra-op Plan:   Post-operative Plan: Post-operative intubation/ventilation  Informed Consent: I have reviewed the patients History and Physical, chart, labs and discussed the procedure including the risks, benefits and alternatives for the proposed anesthesia with the patient or authorized representative who has indicated his/her understanding and acceptance.   Dental advisory given  Plan Discussed with: CRNA  Anesthesia Plan Comments:         Anesthesia Quick Evaluation

## 2018-10-06 ENCOUNTER — Inpatient Hospital Stay (HOSPITAL_COMMUNITY): Payer: Medicare Other

## 2018-10-06 ENCOUNTER — Encounter (HOSPITAL_COMMUNITY): Payer: Self-pay | Admitting: Certified Registered"

## 2018-10-06 ENCOUNTER — Inpatient Hospital Stay (HOSPITAL_COMMUNITY)
Admission: EM | Disposition: A | Discharge status: Home / Self Care | Payer: Self-pay | Source: Home / Self Care | Attending: Cardiothoracic Surgery

## 2018-10-06 ENCOUNTER — Ambulatory Visit: Payer: Medicare Other | Admitting: Podiatry

## 2018-10-06 ENCOUNTER — Inpatient Hospital Stay (HOSPITAL_COMMUNITY): Payer: Medicare Other | Admitting: Anesthesiology

## 2018-10-06 DIAGNOSIS — I2510 Atherosclerotic heart disease of native coronary artery without angina pectoris: Secondary | ICD-10-CM | POA: Diagnosis present

## 2018-10-06 DIAGNOSIS — Z951 Presence of aortocoronary bypass graft: Secondary | ICD-10-CM

## 2018-10-06 HISTORY — PX: CORONARY ARTERY BYPASS GRAFT: SHX141

## 2018-10-06 HISTORY — PX: TEE WITHOUT CARDIOVERSION: SHX5443

## 2018-10-06 LAB — BASIC METABOLIC PANEL WITH GFR
Anion gap: 9 (ref 5–15)
BUN: 15 mg/dL (ref 8–23)
CO2: 26 mmol/L (ref 22–32)
Calcium: 9 mg/dL (ref 8.9–10.3)
Chloride: 107 mmol/L (ref 98–111)
Creatinine, Ser: 1.17 mg/dL (ref 0.61–1.24)
GFR calc Af Amer: >60 mL/min
GFR calc non Af Amer: >60 mL/min
Glucose, Bld: 113 mg/dL — ABNORMAL HIGH (ref 70–99)
Potassium: 3.6 mmol/L (ref 3.5–5.1)
Sodium: 142 mmol/L (ref 135–145)

## 2018-10-06 LAB — POCT I-STAT 3, ART BLOOD GAS (G3+)
ACID-BASE DEFICIT: 2 mmol/L (ref 0.0–2.0)
ACID-BASE DEFICIT: 4 mmol/L — AB (ref 0.0–2.0)
Acid-base deficit: 1 mmol/L (ref 0.0–2.0)
Acid-base deficit: 3 mmol/L — ABNORMAL HIGH (ref 0.0–2.0)
Acid-base deficit: 4 mmol/L — ABNORMAL HIGH (ref 0.0–2.0)
BICARBONATE: 22.9 mmol/L (ref 20.0–28.0)
BICARBONATE: 21 mmol/L (ref 20.0–28.0)
Bicarbonate: 25.2 mmol/L (ref 20.0–28.0)
Bicarbonate: 22.6 mmol/L (ref 20.0–28.0)
Bicarbonate: 20.8 mmol/L (ref 20.0–28.0)
O2 SAT: 100 %
O2 SAT: 97 %
O2 SAT: 97 %
O2 Saturation: 100 %
O2 Saturation: 94 %
PCO2 ART: 50.2 mmHg — AB (ref 32.0–48.0)
PH ART: 7.309 — AB (ref 7.350–7.450)
PH ART: 7.343 — AB (ref 7.350–7.450)
PH ART: 7.373 (ref 7.350–7.450)
PO2 ART: 208 mmHg — AB (ref 83.0–108.0)
PO2 ART: 89 mmHg (ref 83.0–108.0)
PO2 ART: 94 mmHg (ref 83.0–108.0)
Patient temperature: 35.2
Patient temperature: 37
Patient temperature: 37.1
TCO2: 27 mmol/L (ref 22–32)
TCO2: 24 mmol/L (ref 22–32)
TCO2: 24 mmol/L (ref 22–32)
TCO2: 22 mmol/L (ref 22–32)
TCO2: 22 mmol/L (ref 22–32)
pCO2 arterial: 39 mmHg (ref 32.0–48.0)
pCO2 arterial: 40.8 mmHg (ref 32.0–48.0)
pCO2 arterial: 35.7 mmHg (ref 32.0–48.0)
pCO2 arterial: 36.4 mmHg (ref 32.0–48.0)
pH, Arterial: 7.377 (ref 7.350–7.450)
pH, Arterial: 7.369 (ref 7.350–7.450)
pO2, Arterial: 314 mmHg — ABNORMAL HIGH (ref 83.0–108.0)
pO2, Arterial: 75 mmHg — ABNORMAL LOW (ref 83.0–108.0)

## 2018-10-06 LAB — POCT I-STAT, CHEM 8
BUN: 16 mg/dL (ref 8–23)
BUN: 14 mg/dL (ref 8–23)
BUN: 15 mg/dL (ref 8–23)
BUN: 13 mg/dL (ref 8–23)
BUN: 14 mg/dL (ref 8–23)
BUN: 14 mg/dL (ref 8–23)
BUN: 13 mg/dL (ref 8–23)
BUN: 13 mg/dL (ref 8–23)
CALCIUM ION: 1.21 mmol/L (ref 1.15–1.40)
CALCIUM ION: 1.06 mmol/L — AB (ref 1.15–1.40)
CALCIUM ION: 1.07 mmol/L — AB (ref 1.15–1.40)
CHLORIDE: 106 mmol/L (ref 98–111)
CHLORIDE: 107 mmol/L (ref 98–111)
CHLORIDE: 107 mmol/L (ref 98–111)
CHLORIDE: 106 mmol/L (ref 98–111)
CHLORIDE: 104 mmol/L (ref 98–111)
CHLORIDE: 106 mmol/L (ref 98–111)
CHLORIDE: 106 mmol/L (ref 98–111)
CREATININE: 1 mg/dL (ref 0.61–1.24)
CREATININE: 1 mg/dL (ref 0.61–1.24)
CREATININE: 0.9 mg/dL (ref 0.61–1.24)
CREATININE: 0.9 mg/dL (ref 0.61–1.24)
CREATININE: 0.9 mg/dL (ref 0.61–1.24)
Calcium, Ion: 1.16 mmol/L (ref 1.15–1.40)
Calcium, Ion: 1.21 mmol/L (ref 1.15–1.40)
Calcium, Ion: 1.04 mmol/L — ABNORMAL LOW (ref 1.15–1.40)
Calcium, Ion: 1.05 mmol/L — ABNORMAL LOW (ref 1.15–1.40)
Calcium, Ion: 0.97 mmol/L — ABNORMAL LOW (ref 1.15–1.40)
Chloride: 107 mmol/L (ref 98–111)
Creatinine, Ser: 0.9 mg/dL (ref 0.61–1.24)
Creatinine, Ser: 0.8 mg/dL (ref 0.61–1.24)
Creatinine, Ser: 0.8 mg/dL (ref 0.61–1.24)
GLUCOSE: 118 mg/dL — AB (ref 70–99)
GLUCOSE: 128 mg/dL — AB (ref 70–99)
GLUCOSE: 169 mg/dL — AB (ref 70–99)
Glucose, Bld: 108 mg/dL — ABNORMAL HIGH (ref 70–99)
Glucose, Bld: 129 mg/dL — ABNORMAL HIGH (ref 70–99)
Glucose, Bld: 168 mg/dL — ABNORMAL HIGH (ref 70–99)
Glucose, Bld: 128 mg/dL — ABNORMAL HIGH (ref 70–99)
Glucose, Bld: 120 mg/dL — ABNORMAL HIGH (ref 70–99)
HCT: 36 % — ABNORMAL LOW (ref 39.0–52.0)
HCT: 28 % — ABNORMAL LOW (ref 39.0–52.0)
HCT: 28 % — ABNORMAL LOW (ref 39.0–52.0)
HCT: 30 % — ABNORMAL LOW (ref 39.0–52.0)
HEMATOCRIT: 40 % (ref 39.0–52.0)
HEMATOCRIT: 36 % — AB (ref 39.0–52.0)
HEMATOCRIT: 30 % — AB (ref 39.0–52.0)
HEMATOCRIT: 24 % — AB (ref 39.0–52.0)
HEMOGLOBIN: 12.2 g/dL — AB (ref 13.0–17.0)
Hemoglobin: 13.6 g/dL (ref 13.0–17.0)
Hemoglobin: 12.2 g/dL — ABNORMAL LOW (ref 13.0–17.0)
Hemoglobin: 9.5 g/dL — ABNORMAL LOW (ref 13.0–17.0)
Hemoglobin: 10.2 g/dL — ABNORMAL LOW (ref 13.0–17.0)
Hemoglobin: 9.5 g/dL — ABNORMAL LOW (ref 13.0–17.0)
Hemoglobin: 8.2 g/dL — ABNORMAL LOW (ref 13.0–17.0)
Hemoglobin: 10.2 g/dL — ABNORMAL LOW (ref 13.0–17.0)
POTASSIUM: 3.6 mmol/L (ref 3.5–5.1)
POTASSIUM: 3.7 mmol/L (ref 3.5–5.1)
POTASSIUM: 3.8 mmol/L (ref 3.5–5.1)
POTASSIUM: 3.6 mmol/L (ref 3.5–5.1)
POTASSIUM: 4.1 mmol/L (ref 3.5–5.1)
Potassium: 3.9 mmol/L (ref 3.5–5.1)
Potassium: 4.3 mmol/L (ref 3.5–5.1)
Potassium: 3.9 mmol/L (ref 3.5–5.1)
SODIUM: 143 mmol/L (ref 135–145)
SODIUM: 142 mmol/L (ref 135–145)
SODIUM: 137 mmol/L (ref 135–145)
SODIUM: 139 mmol/L (ref 135–145)
Sodium: 143 mmol/L (ref 135–145)
Sodium: 143 mmol/L (ref 135–145)
Sodium: 140 mmol/L (ref 135–145)
Sodium: 143 mmol/L (ref 135–145)
TCO2: 24 mmol/L (ref 22–32)
TCO2: 24 mmol/L (ref 22–32)
TCO2: 24 mmol/L (ref 22–32)
TCO2: 25 mmol/L (ref 22–32)
TCO2: 23 mmol/L (ref 22–32)
TCO2: 24 mmol/L (ref 22–32)
TCO2: 22 mmol/L (ref 22–32)
TCO2: 22 mmol/L (ref 22–32)

## 2018-10-06 LAB — CBC
HCT: 42.8 % (ref 39.0–52.0)
HCT: 34 % — ABNORMAL LOW (ref 39.0–52.0)
HCT: 32.8 % — ABNORMAL LOW (ref 39.0–52.0)
Hemoglobin: 13.9 g/dL (ref 13.0–17.0)
Hemoglobin: 11 g/dL — ABNORMAL LOW (ref 13.0–17.0)
Hemoglobin: 10.9 g/dL — ABNORMAL LOW (ref 13.0–17.0)
MCH: 28.7 pg (ref 26.0–34.0)
MCH: 29.2 pg (ref 26.0–34.0)
MCH: 29.6 pg (ref 26.0–34.0)
MCHC: 32.5 g/dL (ref 30.0–36.0)
MCHC: 32.4 g/dL (ref 30.0–36.0)
MCHC: 33.2 g/dL (ref 30.0–36.0)
MCV: 88.4 fL (ref 80.0–100.0)
MCV: 90.2 fL (ref 80.0–100.0)
MCV: 89.1 fL (ref 80.0–100.0)
PLATELETS: 98 10*3/uL — AB (ref 150–400)
Platelets: 165 10*3/uL (ref 150–400)
Platelets: 113 10*3/uL — ABNORMAL LOW (ref 150–400)
RBC: 4.84 MIL/uL (ref 4.22–5.81)
RBC: 3.77 MIL/uL — AB (ref 4.22–5.81)
RBC: 3.68 MIL/uL — ABNORMAL LOW (ref 4.22–5.81)
RDW: 13.6 % (ref 11.5–15.5)
RDW: 13.7 % (ref 11.5–15.5)
RDW: 13.7 % (ref 11.5–15.5)
WBC: 9 10*3/uL (ref 4.0–10.5)
WBC: 16 10*3/uL — ABNORMAL HIGH (ref 4.0–10.5)
WBC: 13 10*3/uL — ABNORMAL HIGH (ref 4.0–10.5)
nRBC: 0 % (ref 0.0–0.2)
nRBC: 0 % (ref 0.0–0.2)
nRBC: 0 % (ref 0.0–0.2)

## 2018-10-06 LAB — GLUCOSE, CAPILLARY
GLUCOSE-CAPILLARY: 128 mg/dL — AB (ref 70–99)
GLUCOSE-CAPILLARY: 122 mg/dL — AB (ref 70–99)
Glucose-Capillary: 101 mg/dL — ABNORMAL HIGH (ref 70–99)
Glucose-Capillary: 117 mg/dL — ABNORMAL HIGH (ref 70–99)
Glucose-Capillary: 117 mg/dL — ABNORMAL HIGH (ref 70–99)
Glucose-Capillary: 115 mg/dL — ABNORMAL HIGH (ref 70–99)
Glucose-Capillary: 146 mg/dL — ABNORMAL HIGH (ref 70–99)
Glucose-Capillary: 116 mg/dL — ABNORMAL HIGH (ref 70–99)

## 2018-10-06 LAB — POCT I-STAT 4, (NA,K, GLUC, HGB,HCT)
Glucose, Bld: 129 mg/dL — ABNORMAL HIGH (ref 70–99)
HCT: 32 % — ABNORMAL LOW (ref 39.0–52.0)
Hemoglobin: 10.9 g/dL — ABNORMAL LOW (ref 13.0–17.0)
Potassium: 4 mmol/L (ref 3.5–5.1)
Sodium: 143 mmol/L (ref 135–145)

## 2018-10-06 LAB — MAGNESIUM: Magnesium: 3.2 mg/dL — ABNORMAL HIGH (ref 1.7–2.4)

## 2018-10-06 LAB — PLATELET COUNT: Platelets: 119 10*3/uL — ABNORMAL LOW (ref 150–400)

## 2018-10-06 LAB — APTT: aPTT: 31 seconds (ref 24–36)

## 2018-10-06 LAB — HEPARIN LEVEL (UNFRACTIONATED): Heparin Unfractionated: 0.36 [IU]/mL (ref 0.30–0.70)

## 2018-10-06 LAB — HEMOGLOBIN AND HEMATOCRIT, BLOOD
HCT: 31 % — ABNORMAL LOW (ref 39.0–52.0)
Hemoglobin: 10.5 g/dL — ABNORMAL LOW (ref 13.0–17.0)

## 2018-10-06 LAB — PROTIME-INR
INR: 1.4
PROTHROMBIN TIME: 17 s — AB (ref 11.4–15.2)

## 2018-10-06 SURGERY — CORONARY ARTERY BYPASS GRAFTING (CABG)
Anesthesia: General | Site: Chest

## 2018-10-06 MED ORDER — PROTAMINE SULFATE 10 MG/ML IV SOLN
INTRAVENOUS | Status: AC
  Filled 2018-10-06: qty 25

## 2018-10-06 MED ORDER — PROTAMINE SULFATE 10 MG/ML IV SOLN
INTRAVENOUS | Status: DC | PRN
  Administered 2018-10-06: 270 mg via INTRAVENOUS

## 2018-10-06 MED ORDER — LACTATED RINGERS IV SOLN
INTRAVENOUS | Status: DC | PRN
  Administered 2018-10-06: 07:00:00 via INTRAVENOUS

## 2018-10-06 MED ORDER — EPHEDRINE SULFATE-NACL 50-0.9 MG/10ML-% IV SOSY
PREFILLED_SYRINGE | INTRAVENOUS | Status: DC | PRN
  Administered 2018-10-06: 5 mg via INTRAVENOUS
  Administered 2018-10-06: 10 mg via INTRAVENOUS

## 2018-10-06 MED ORDER — ORAL CARE MOUTH RINSE
15.0000 mL | Freq: Two times a day (BID) | OROMUCOSAL | Status: DC
  Administered 2018-10-07 – 2018-10-08 (×3): 15 mL via OROMUCOSAL

## 2018-10-06 MED ORDER — TRAMADOL HCL 50 MG PO TABS
50.0000 mg | ORAL_TABLET | ORAL | Status: DC | PRN
  Administered 2018-10-07 – 2018-10-08 (×3): 50 mg via ORAL
  Filled 2018-10-06 (×3): qty 1

## 2018-10-06 MED ORDER — DOCUSATE SODIUM 100 MG PO CAPS
200.0000 mg | ORAL_CAPSULE | Freq: Every day | ORAL | Status: DC
  Administered 2018-10-07 – 2018-10-12 (×4): 200 mg via ORAL
  Filled 2018-10-06 (×6): qty 2

## 2018-10-06 MED ORDER — HEPARIN SODIUM (PORCINE) 1000 UNIT/ML IJ SOLN
INTRAMUSCULAR | Status: DC | PRN
  Administered 2018-10-06: 25000 [IU] via INTRAVENOUS
  Administered 2018-10-06 (×2): 2000 [IU] via INTRAVENOUS

## 2018-10-06 MED ORDER — SODIUM CHLORIDE 0.45 % IV SOLN
INTRAVENOUS | Status: DC | PRN
  Administered 2018-10-06 – 2018-10-09 (×2): via INTRAVENOUS

## 2018-10-06 MED ORDER — LACTATED RINGERS IV SOLN: INTRAVENOUS | Status: DC

## 2018-10-06 MED ORDER — METOPROLOL TARTRATE 25 MG/10 ML ORAL SUSPENSION: 12.5000 mg | Freq: Two times a day (BID) | ORAL | Status: DC

## 2018-10-06 MED ORDER — ACETAMINOPHEN 160 MG/5ML PO SOLN: 1000.0000 mg | Freq: Four times a day (QID) | ORAL | Status: AC

## 2018-10-06 MED ORDER — ORAL CARE MOUTH RINSE
15.0000 mL | OROMUCOSAL | Status: DC
  Administered 2018-10-06 (×2): 15 mL via OROMUCOSAL

## 2018-10-06 MED ORDER — SODIUM CHLORIDE 0.9 % IV SOLN
INTRAVENOUS | Status: DC | PRN
  Administered 2018-10-06: 13:00:00 via INTRAVENOUS

## 2018-10-06 MED ORDER — SODIUM CHLORIDE 0.9 % IJ SOLN
INTRAMUSCULAR | Status: AC
  Filled 2018-10-06: qty 10

## 2018-10-06 MED ORDER — BISACODYL 5 MG PO TBEC
10.0000 mg | DELAYED_RELEASE_TABLET | Freq: Every day | ORAL | Status: DC
  Administered 2018-10-07 – 2018-10-09 (×3): 10 mg via ORAL
  Filled 2018-10-06 (×4): qty 2

## 2018-10-06 MED ORDER — NOREPINEPHRINE 4 MG/250ML-% IV SOLN
0.0000 ug/min | INTRAVENOUS | Status: DC
  Filled 2018-10-06: qty 250

## 2018-10-06 MED ORDER — LACTATED RINGERS IV SOLN
INTRAVENOUS | Status: DC
  Administered 2018-10-06: 20:00:00 via INTRAVENOUS

## 2018-10-06 MED ORDER — NOREPINEPHRINE BITARTRATE 1 MG/ML IV SOLN
INTRAVENOUS | Status: DC | PRN
  Administered 2018-10-06: 2 ug/min via INTRAVENOUS

## 2018-10-06 MED ORDER — PROPOFOL 10 MG/ML IV BOLUS
INTRAVENOUS | Status: AC
  Filled 2018-10-06: qty 20

## 2018-10-06 MED ORDER — VECURONIUM BROMIDE 10 MG IV SOLR
INTRAVENOUS | Status: DC | PRN
  Administered 2018-10-06: 4 mg via INTRAVENOUS
  Administered 2018-10-06: 1 mg via INTRAVENOUS
  Administered 2018-10-06: 2 mg via INTRAVENOUS

## 2018-10-06 MED ORDER — PROPOFOL 10 MG/ML IV BOLUS
INTRAVENOUS | Status: DC | PRN
  Administered 2018-10-06: 50 mg via INTRAVENOUS

## 2018-10-06 MED ORDER — FAMOTIDINE IN NACL 20-0.9 MG/50ML-% IV SOLN
20.0000 mg | Freq: Two times a day (BID) | INTRAVENOUS | Status: AC
  Administered 2018-10-06 (×2): 20 mg via INTRAVENOUS
  Filled 2018-10-06: qty 50

## 2018-10-06 MED ORDER — INSULIN REGULAR(HUMAN) IN NACL 100-0.9 UT/100ML-% IV SOLN: INTRAVENOUS | Status: DC

## 2018-10-06 MED ORDER — ACETAMINOPHEN 160 MG/5ML PO SOLN: 650.0000 mg | Freq: Once | ORAL | Status: AC

## 2018-10-06 MED ORDER — SODIUM CHLORIDE 0.9 % IV SOLN
INTRAVENOUS | Status: DC | PRN
  Administered 2018-10-06: 30 ug/min via INTRAVENOUS

## 2018-10-06 MED ORDER — MAGNESIUM SULFATE 2 GM/50ML IV SOLN
INTRAVENOUS | Status: AC
  Filled 2018-10-06: qty 50

## 2018-10-06 MED ORDER — BISACODYL 10 MG RE SUPP: 10.0000 mg | Freq: Every day | RECTAL | Status: DC

## 2018-10-06 MED ORDER — ROCURONIUM BROMIDE 50 MG/5ML IV SOSY
PREFILLED_SYRINGE | INTRAVENOUS | Status: AC
  Filled 2018-10-06: qty 5

## 2018-10-06 MED ORDER — CHLORHEXIDINE GLUCONATE 0.12% ORAL RINSE (MEDLINE KIT)
15.0000 mL | Freq: Two times a day (BID) | OROMUCOSAL | Status: DC
  Administered 2018-10-06 – 2018-10-08 (×5): 15 mL via OROMUCOSAL

## 2018-10-06 MED ORDER — VANCOMYCIN HCL IN DEXTROSE 1-5 GM/200ML-% IV SOLN
1000.0000 mg | Freq: Once | INTRAVENOUS | Status: AC
  Administered 2018-10-06: 1000 mg via INTRAVENOUS
  Filled 2018-10-06: qty 200

## 2018-10-06 MED ORDER — POTASSIUM CHLORIDE 10 MEQ/50ML IV SOLN: 10.0000 meq | INTRAVENOUS | Status: AC

## 2018-10-06 MED ORDER — METOCLOPRAMIDE HCL 5 MG/ML IJ SOLN
10.0000 mg | Freq: Four times a day (QID) | INTRAMUSCULAR | Status: AC
  Administered 2018-10-06 – 2018-10-11 (×18): 10 mg via INTRAVENOUS
  Filled 2018-10-06 (×17): qty 2

## 2018-10-06 MED ORDER — SODIUM CHLORIDE 0.9% FLUSH
3.0000 mL | Freq: Two times a day (BID) | INTRAVENOUS | Status: DC
  Administered 2018-10-07 – 2018-10-09 (×6): 3 mL via INTRAVENOUS

## 2018-10-06 MED ORDER — FENTANYL CITRATE (PF) 250 MCG/5ML IJ SOLN
INTRAMUSCULAR | Status: DC | PRN
  Administered 2018-10-06: 100 ug via INTRAVENOUS
  Administered 2018-10-06: 400 ug via INTRAVENOUS
  Administered 2018-10-06: 50 ug via INTRAVENOUS
  Administered 2018-10-06: 100 ug via INTRAVENOUS
  Administered 2018-10-06: 50 ug via INTRAVENOUS
  Administered 2018-10-06 (×2): 100 ug via INTRAVENOUS
  Administered 2018-10-06: 50 ug via INTRAVENOUS

## 2018-10-06 MED ORDER — MORPHINE SULFATE (PF) 2 MG/ML IV SOLN
1.0000 mg | INTRAVENOUS | Status: DC | PRN
  Administered 2018-10-06: 1 mg via INTRAVENOUS
  Administered 2018-10-06 (×2): 2 mg via INTRAVENOUS
  Filled 2018-10-06: qty 1

## 2018-10-06 MED ORDER — SODIUM CHLORIDE 0.9% IV SOLUTION: Freq: Once | INTRAVENOUS | Status: DC

## 2018-10-06 MED ORDER — GLYCOPYRROLATE PF 0.2 MG/ML IJ SOSY
PREFILLED_SYRINGE | INTRAMUSCULAR | Status: DC | PRN
  Administered 2018-10-06: .2 mg via INTRAVENOUS

## 2018-10-06 MED ORDER — MIDAZOLAM HCL 5 MG/5ML IJ SOLN
INTRAMUSCULAR | Status: DC | PRN
  Administered 2018-10-06 (×3): 2 mg via INTRAVENOUS
  Administered 2018-10-06 (×2): 1 mg via INTRAVENOUS
  Administered 2018-10-06: 2 mg via INTRAVENOUS

## 2018-10-06 MED ORDER — OXYCODONE HCL 5 MG PO TABS
5.0000 mg | ORAL_TABLET | ORAL | Status: DC | PRN
  Administered 2018-10-07 – 2018-10-08 (×8): 5 mg via ORAL
  Filled 2018-10-06 (×8): qty 1

## 2018-10-06 MED ORDER — MIDAZOLAM HCL 10 MG/2ML IJ SOLN
INTRAMUSCULAR | Status: AC
  Filled 2018-10-06: qty 2

## 2018-10-06 MED ORDER — MORPHINE SULFATE (PF) 2 MG/ML IV SOLN
2.0000 mg | INTRAVENOUS | Status: DC | PRN
  Administered 2018-10-07: 4 mg via INTRAVENOUS
  Filled 2018-10-06 (×2): qty 1
  Filled 2018-10-06: qty 2

## 2018-10-06 MED ORDER — METOPROLOL TARTRATE 5 MG/5ML IV SOLN
2.5000 mg | INTRAVENOUS | Status: DC | PRN
  Administered 2018-10-08: 2.5 mg via INTRAVENOUS
  Filled 2018-10-06: qty 5

## 2018-10-06 MED ORDER — DEXMEDETOMIDINE HCL IN NACL 400 MCG/100ML IV SOLN
INTRAVENOUS | Status: AC
  Filled 2018-10-06: qty 100

## 2018-10-06 MED ORDER — ONDANSETRON HCL 4 MG/2ML IJ SOLN
4.0000 mg | Freq: Four times a day (QID) | INTRAMUSCULAR | Status: DC | PRN
  Administered 2018-10-07 – 2018-10-09 (×3): 4 mg via INTRAVENOUS
  Filled 2018-10-06 (×3): qty 2

## 2018-10-06 MED ORDER — PHENYLEPHRINE HCL 10 MG/ML IJ SOLN
INTRAMUSCULAR | Status: AC
  Filled 2018-10-06: qty 1

## 2018-10-06 MED ORDER — LACTATED RINGERS IV SOLN: 500.0000 mL | Freq: Once | INTRAVENOUS | Status: DC | PRN

## 2018-10-06 MED ORDER — METOPROLOL TARTRATE 12.5 MG HALF TABLET
12.5000 mg | ORAL_TABLET | Freq: Two times a day (BID) | ORAL | Status: DC
  Administered 2018-10-07 – 2018-10-13 (×10): 12.5 mg via ORAL
  Filled 2018-10-06 (×12): qty 1

## 2018-10-06 MED ORDER — ARTIFICIAL TEARS OPHTHALMIC OINT
TOPICAL_OINTMENT | OPHTHALMIC | Status: AC
  Filled 2018-10-06: qty 3.5

## 2018-10-06 MED ORDER — VECURONIUM BROMIDE 10 MG IV SOLR
INTRAVENOUS | Status: AC
  Filled 2018-10-06: qty 20

## 2018-10-06 MED ORDER — ROCURONIUM BROMIDE 10 MG/ML (PF) SYRINGE
PREFILLED_SYRINGE | INTRAVENOUS | Status: DC | PRN
  Administered 2018-10-06: 50 mg via INTRAVENOUS
  Administered 2018-10-06: 100 mg via INTRAVENOUS

## 2018-10-06 MED ORDER — ROCURONIUM BROMIDE 50 MG/5ML IV SOSY
PREFILLED_SYRINGE | INTRAVENOUS | Status: AC
  Filled 2018-10-06: qty 15

## 2018-10-06 MED ORDER — PHENYLEPHRINE HCL-NACL 20-0.9 MG/250ML-% IV SOLN
0.0000 ug/min | INTRAVENOUS | Status: DC
  Filled 2018-10-06: qty 250

## 2018-10-06 MED ORDER — ARTIFICIAL TEARS OPHTHALMIC OINT
TOPICAL_OINTMENT | OPHTHALMIC | Status: DC | PRN
  Administered 2018-10-06: 1 via OPHTHALMIC

## 2018-10-06 MED ORDER — MAGNESIUM SULFATE 4 GM/100ML IV SOLN
INTRAVENOUS | Status: AC
  Filled 2018-10-06: qty 100

## 2018-10-06 MED ORDER — ACETAMINOPHEN 650 MG RE SUPP
650.0000 mg | Freq: Once | RECTAL | Status: AC
  Administered 2018-10-06: 650 mg via RECTAL

## 2018-10-06 MED ORDER — SODIUM CHLORIDE 0.9 % IV SOLN
20.0000 ug | INTRAVENOUS | Status: AC
  Administered 2018-10-06: 20 ug via INTRAVENOUS
  Filled 2018-10-06: qty 5

## 2018-10-06 MED ORDER — MAGNESIUM SULFATE 4 GM/100ML IV SOLN
4.0000 g | Freq: Once | INTRAVENOUS | Status: AC
  Administered 2018-10-06: 4 g via INTRAVENOUS

## 2018-10-06 MED ORDER — INSULIN REGULAR BOLUS VIA INFUSION
0.0000 [IU] | Freq: Three times a day (TID) | INTRAVENOUS | Status: DC
  Filled 2018-10-06: qty 10

## 2018-10-06 MED ORDER — PROTAMINE SULFATE 10 MG/ML IV SOLN
INTRAVENOUS | Status: AC
  Filled 2018-10-06: qty 5

## 2018-10-06 MED ORDER — SODIUM CHLORIDE 0.9 % IV SOLN
INTRAVENOUS | Status: DC
  Administered 2018-10-06: 15:00:00 via INTRAVENOUS

## 2018-10-06 MED ORDER — DEXMEDETOMIDINE HCL IN NACL 200 MCG/50ML IV SOLN
0.0000 ug/kg/h | INTRAVENOUS | Status: DC
  Administered 2018-10-06: 0.5 ug/kg/h via INTRAVENOUS
  Filled 2018-10-06: qty 50

## 2018-10-06 MED ORDER — HEMOSTATIC AGENTS (NO CHARGE) OPTIME
TOPICAL | Status: DC | PRN
  Administered 2018-10-06 (×5): 1 via TOPICAL

## 2018-10-06 MED ORDER — ALBUMIN HUMAN 5 % IV SOLN
250.0000 mL | INTRAVENOUS | Status: AC | PRN
  Administered 2018-10-06 (×2): 12.5 g via INTRAVENOUS

## 2018-10-06 MED ORDER — SODIUM CHLORIDE 0.9 % IV SOLN
1.5000 g | Freq: Two times a day (BID) | INTRAVENOUS | Status: AC
  Administered 2018-10-06 – 2018-10-08 (×4): 1.5 g via INTRAVENOUS
  Filled 2018-10-06 (×4): qty 1.5

## 2018-10-06 MED ORDER — CHLORHEXIDINE GLUCONATE 0.12 % MT SOLN
15.0000 mL | OROMUCOSAL | Status: AC
  Administered 2018-10-06: 15 mL via OROMUCOSAL

## 2018-10-06 MED ORDER — PANTOPRAZOLE SODIUM 40 MG PO TBEC
40.0000 mg | DELAYED_RELEASE_TABLET | Freq: Every day | ORAL | Status: DC
  Administered 2018-10-08 – 2018-10-13 (×6): 40 mg via ORAL
  Filled 2018-10-06 (×6): qty 1

## 2018-10-06 MED ORDER — SODIUM CHLORIDE 0.9% FLUSH: 3.0000 mL | INTRAVENOUS | Status: DC | PRN

## 2018-10-06 MED ORDER — 0.9 % SODIUM CHLORIDE (POUR BTL) OPTIME
TOPICAL | Status: DC | PRN
  Administered 2018-10-06: 6000 mL

## 2018-10-06 MED ORDER — LACTATED RINGERS IV SOLN
INTRAVENOUS | Status: DC | PRN
  Administered 2018-10-06 (×2): via INTRAVENOUS

## 2018-10-06 MED ORDER — ASPIRIN EC 325 MG PO TBEC
325.0000 mg | DELAYED_RELEASE_TABLET | Freq: Every day | ORAL | Status: DC
  Administered 2018-10-07 – 2018-10-13 (×7): 325 mg via ORAL
  Filled 2018-10-06 (×7): qty 1

## 2018-10-06 MED ORDER — MIDAZOLAM HCL 2 MG/2ML IJ SOLN: 2.0000 mg | INTRAMUSCULAR | Status: DC | PRN

## 2018-10-06 MED ORDER — HEPARIN SODIUM (PORCINE) 1000 UNIT/ML IJ SOLN
INTRAMUSCULAR | Status: AC
  Filled 2018-10-06: qty 1

## 2018-10-06 MED ORDER — SODIUM CHLORIDE 0.9 % IV SOLN: 250.0000 mL | INTRAVENOUS | Status: DC

## 2018-10-06 MED ORDER — NITROGLYCERIN IN D5W 200-5 MCG/ML-% IV SOLN: 0.0000 ug/min | INTRAVENOUS | Status: DC

## 2018-10-06 MED ORDER — SODIUM CHLORIDE 0.9 % IV SOLN
INTRAVENOUS | Status: DC | PRN
  Administered 2018-10-06: 750 mg via INTRAVENOUS

## 2018-10-06 MED ORDER — ASPIRIN 81 MG PO CHEW: 324.0000 mg | CHEWABLE_TABLET | Freq: Every day | ORAL | Status: DC

## 2018-10-06 MED ORDER — FENTANYL CITRATE (PF) 250 MCG/5ML IJ SOLN
INTRAMUSCULAR | Status: AC
  Filled 2018-10-06: qty 25

## 2018-10-06 MED ORDER — ACETAMINOPHEN 500 MG PO TABS
1000.0000 mg | ORAL_TABLET | Freq: Four times a day (QID) | ORAL | Status: AC
  Administered 2018-10-07 – 2018-10-11 (×16): 1000 mg via ORAL
  Filled 2018-10-06 (×17): qty 2

## 2018-10-06 SURGICAL SUPPLY — 113 items
ADAPTER CARDIO PERF ANTE/RETRO (ADAPTER) ×4 IMPLANT
ADH SKN CLS APL DERMABOND .7 (GAUZE/BANDAGES/DRESSINGS) ×2
ADH SKN CLS LQ APL DERMABOND (GAUZE/BANDAGES/DRESSINGS) ×2
ADPR PRFSN 84XANTGRD RTRGD (ADAPTER) ×2
BAG DECANTER FOR FLEXI CONT (MISCELLANEOUS) ×4 IMPLANT
BANDAGE ACE 4X5 VEL STRL LF (GAUZE/BANDAGES/DRESSINGS) ×4 IMPLANT
BANDAGE ACE 6X5 VEL STRL LF (GAUZE/BANDAGES/DRESSINGS) ×4 IMPLANT
BASKET HEART  (ORDER IN 25'S) (MISCELLANEOUS) ×1
BASKET HEART (ORDER IN 25'S) (MISCELLANEOUS) ×1
BASKET HEART (ORDER IN 25S) (MISCELLANEOUS) ×2 IMPLANT
BLADE CLIPPER SURG (BLADE) ×2 IMPLANT
BLADE NDL 3 SS STRL (BLADE) IMPLANT
BLADE NEEDLE 3 SS STRL (BLADE) ×6 IMPLANT
BLADE NEEDLE 3MM SS STRL (BLADE) ×2
BLADE STERNUM SYSTEM 6 (BLADE) ×4 IMPLANT
BLADE SURG 11 STRL SS (BLADE) ×2 IMPLANT
BLADE SURG 12 STRL SS (BLADE) ×4 IMPLANT
BNDG GAUZE ELAST 4 BULKY (GAUZE/BANDAGES/DRESSINGS) ×4 IMPLANT
CANISTER SUCT 3000ML PPV (MISCELLANEOUS) ×4 IMPLANT
CANNULA ARTERIAL NVNT 3/8 24FR (CANNULA) ×2 IMPLANT
CANNULA GUNDRY RCSP 15FR (MISCELLANEOUS) ×4 IMPLANT
CATH CPB KIT VANTRIGT (MISCELLANEOUS) ×4 IMPLANT
CATH ROBINSON RED A/P 18FR (CATHETERS) ×12 IMPLANT
CATH THORACIC 36FR RT ANG (CATHETERS) ×4 IMPLANT
CLIP VESOCCLUDE SM WIDE 24/CT (CLIP) ×3 IMPLANT
COVER WAND RF STERILE (DRAPES) ×4 IMPLANT
CRADLE DONUT ADULT HEAD (MISCELLANEOUS) ×4 IMPLANT
DERMABOND ADHESIVE PROPEN (GAUZE/BANDAGES/DRESSINGS) ×2
DERMABOND ADVANCED (GAUZE/BANDAGES/DRESSINGS) ×2
DERMABOND ADVANCED .7 DNX12 (GAUZE/BANDAGES/DRESSINGS) ×1 IMPLANT
DERMABOND ADVANCED .7 DNX6 (GAUZE/BANDAGES/DRESSINGS) ×1 IMPLANT
DRAIN CHANNEL 32F RND 10.7 FF (WOUND CARE) ×4 IMPLANT
DRAPE CARDIOVASCULAR INCISE (DRAPES) ×4
DRAPE SLUSH/WARMER DISC (DRAPES) ×4 IMPLANT
DRAPE SRG 135X102X78XABS (DRAPES) ×2 IMPLANT
DRSG AQUACEL AG ADV 3.5X14 (GAUZE/BANDAGES/DRESSINGS) ×4 IMPLANT
ELECT BLADE 4.0 EZ CLEAN MEGAD (MISCELLANEOUS) ×4
ELECT BLADE 6.5 EXT (BLADE) ×4 IMPLANT
ELECT CAUTERY BLADE 6.4 (BLADE) ×4 IMPLANT
ELECT REM PT RETURN 9FT ADLT (ELECTROSURGICAL) ×8
ELECTRODE BLDE 4.0 EZ CLN MEGD (MISCELLANEOUS) ×2 IMPLANT
ELECTRODE REM PT RTRN 9FT ADLT (ELECTROSURGICAL) ×4 IMPLANT
FELT TEFLON 1X6 (MISCELLANEOUS) ×8 IMPLANT
GAUZE SPONGE 4X4 12PLY STRL (GAUZE/BANDAGES/DRESSINGS) ×8 IMPLANT
GAUZE SPONGE 4X4 12PLY STRL LF (GAUZE/BANDAGES/DRESSINGS) ×4 IMPLANT
GLOVE BIO SURGEON STRL SZ7.5 (GLOVE) ×12 IMPLANT
GLOVE BIOGEL PI IND STRL 6 (GLOVE) IMPLANT
GLOVE BIOGEL PI IND STRL 8 (GLOVE) ×4 IMPLANT
GLOVE BIOGEL PI INDICATOR 6 (GLOVE) ×6
GLOVE BIOGEL PI INDICATOR 8 (GLOVE) ×8
GLOVE ECLIPSE 8.0 STRL XLNG CF (GLOVE) ×12 IMPLANT
GOWN STRL REUS W/ TWL LRG LVL3 (GOWN DISPOSABLE) ×8 IMPLANT
GOWN STRL REUS W/TWL 2XL LVL3 (GOWN DISPOSABLE) ×9 IMPLANT
GOWN STRL REUS W/TWL LRG LVL3 (GOWN DISPOSABLE) ×32
HEMOSTAT POWDER SURGIFOAM 1G (HEMOSTASIS) ×12 IMPLANT
HEMOSTAT SURGICEL 2X14 (HEMOSTASIS) ×4 IMPLANT
INSERT FOGARTY XLG (MISCELLANEOUS) IMPLANT
KIT BASIN OR (CUSTOM PROCEDURE TRAY) ×4 IMPLANT
KIT SUCTION CATH 14FR (SUCTIONS) ×4 IMPLANT
KIT TURNOVER KIT B (KITS) ×4 IMPLANT
KIT VASOVIEW HEMOPRO VH 3000 (KITS) ×4 IMPLANT
LEAD PACING MYOCARDI (MISCELLANEOUS) ×4 IMPLANT
MARKER GRAFT CORONARY BYPASS (MISCELLANEOUS) ×12 IMPLANT
NDL 27GX1/2 REG BEVEL ECLIP (NEEDLE) IMPLANT
NEEDLE 27GX1/2 REG BEVEL ECLIP (NEEDLE) ×4 IMPLANT
NS IRRIG 1000ML POUR BTL (IV SOLUTION) ×22 IMPLANT
PACK E OPEN HEART (SUTURE) ×4 IMPLANT
PACK OPEN HEART (CUSTOM PROCEDURE TRAY) ×4 IMPLANT
PAD ARMBOARD 7.5X6 YLW CONV (MISCELLANEOUS) ×8 IMPLANT
PAD ELECT DEFIB RADIOL ZOLL (MISCELLANEOUS) ×4 IMPLANT
PENCIL BUTTON HOLSTER BLD 10FT (ELECTRODE) ×4 IMPLANT
PUNCH AORTIC ROTATE  4.5MM 8IN (MISCELLANEOUS) ×4 IMPLANT
PUNCH AORTIC ROTATE 4.0MM (MISCELLANEOUS) IMPLANT
PUNCH AORTIC ROTATE 4.5MM 8IN (MISCELLANEOUS) IMPLANT
PUNCH AORTIC ROTATE 5MM 8IN (MISCELLANEOUS) IMPLANT
SENSOR MYOCARDIAL TEMP (MISCELLANEOUS) ×2 IMPLANT
SET CARDIOPLEGIA MPS 5001102 (MISCELLANEOUS) ×2 IMPLANT
SPONGE LAP 4X18 RFD (DISPOSABLE) ×6 IMPLANT
SURGIFLO W/THROMBIN 8M KIT (HEMOSTASIS) ×4 IMPLANT
SUT BONE WAX W31G (SUTURE) ×4 IMPLANT
SUT MNCRL AB 4-0 PS2 18 (SUTURE) ×3 IMPLANT
SUT PROLENE 3 0 SH DA (SUTURE) IMPLANT
SUT PROLENE 3 0 SH1 36 (SUTURE) IMPLANT
SUT PROLENE 4 0 RB 1 (SUTURE) ×4
SUT PROLENE 4 0 SH DA (SUTURE) ×7 IMPLANT
SUT PROLENE 4-0 RB1 .5 CRCL 36 (SUTURE) ×2 IMPLANT
SUT PROLENE 5 0 C 1 36 (SUTURE) ×3 IMPLANT
SUT PROLENE 6 0 C 1 30 (SUTURE) ×8 IMPLANT
SUT PROLENE 6 0 CC (SUTURE) ×12 IMPLANT
SUT PROLENE 8 0 BV175 6 (SUTURE) ×2 IMPLANT
SUT PROLENE BLUE 7 0 (SUTURE) ×10 IMPLANT
SUT SILK  1 MH (SUTURE)
SUT SILK 1 MH (SUTURE) IMPLANT
SUT SILK 2 0 SH CR/8 (SUTURE) ×2 IMPLANT
SUT SILK 3 0 SH CR/8 (SUTURE) IMPLANT
SUT STEEL 6MS V (SUTURE) ×6 IMPLANT
SUT STEEL SZ 6 DBL 3X14 BALL (SUTURE) ×10 IMPLANT
SUT VIC AB 1 CTX 36 (SUTURE) ×16
SUT VIC AB 1 CTX36XBRD ANBCTR (SUTURE) ×4 IMPLANT
SUT VIC AB 2-0 CT1 27 (SUTURE) ×4
SUT VIC AB 2-0 CT1 TAPERPNT 27 (SUTURE) IMPLANT
SUT VIC AB 2-0 CTX 27 (SUTURE) IMPLANT
SUT VIC AB 3-0 X1 27 (SUTURE) IMPLANT
SYSTEM SAHARA CHEST DRAIN ATS (WOUND CARE) ×4 IMPLANT
TAPE CLOTH SURG 4X10 WHT LF (GAUZE/BANDAGES/DRESSINGS) ×2 IMPLANT
TAPE PAPER 2X10 WHT MICROPORE (GAUZE/BANDAGES/DRESSINGS) ×3 IMPLANT
TOWEL GREEN STERILE (TOWEL DISPOSABLE) ×4 IMPLANT
TOWEL GREEN STERILE FF (TOWEL DISPOSABLE) ×4 IMPLANT
TRAY FOLEY SLVR 16FR TEMP STAT (SET/KITS/TRAYS/PACK) ×4 IMPLANT
TUBE SUCT INTRACARD DLP 20F (MISCELLANEOUS) ×3 IMPLANT
TUBING INSUFFLATION (TUBING) ×4 IMPLANT
UNDERPAD 30X30 (UNDERPADS AND DIAPERS) ×4 IMPLANT
WATER STERILE IRR 1000ML POUR (IV SOLUTION) ×8 IMPLANT

## 2018-10-06 NOTE — Anesthesia Procedure Notes (Signed)
Procedure Name: Intubation Date/Time: 10/06/2018 8:12 AM Performed by: Imagene Riches, CRNA Pre-anesthesia Checklist: Patient identified, Emergency Drugs available, Suction available and Patient being monitored Patient Re-evaluated:Patient Re-evaluated prior to induction Oxygen Delivery Method: Circle System Utilized Preoxygenation: Pre-oxygenation with 100% oxygen Induction Type: IV induction Ventilation: Mask ventilation without difficulty Laryngoscope Size: Miller and 3 Grade View: Grade I Tube type: Oral Tube size: 7.5 mm Number of attempts: 1 Airway Equipment and Method: Stylet and Oral airway Placement Confirmation: ETT inserted through vocal cords under direct vision,  positive ETCO2 and breath sounds checked- equal and bilateral Secured at: 24 cm Tube secured with: Tape Dental Injury: Teeth and Oropharynx as per pre-operative assessment

## 2018-10-06 NOTE — Progress Notes (Signed)
RT NOTES: Rapid Wean Protocol initiated.  

## 2018-10-06 NOTE — Progress Notes (Signed)
Pre Procedure note for inpatients:   David [name] has been scheduled for Procedure(s): CORONARY ARTERY BYPASS GRAFTING (CABG) (N/A) TRANSESOPHAGEAL ECHOCARDIOGRAM (TEE) (N/A) today. The various methods of treatment have been discussed with the patient. After consideration of the risks, benefits and treatment options the patient has consented to the planned procedure.   The patient has been seen and labs reviewed. There are no changes in the patient's condition to prevent proceeding with the planned procedure today.  Recent labs:  Lab Results  Component Value Date   WBC 9.0 10/06/2018   HGB 13.9 10/06/2018   HCT 42.8 10/06/2018   PLT 165 10/06/2018   GLUCOSE 113 (H) 10/06/2018   CHOL 140 10/02/2018   TRIG 74 10/02/2018   HDL 34 (L) 10/02/2018   LDLCALC 91 10/02/2018   ALT 19 10/04/2018   AST 20 10/04/2018   NA 142 10/06/2018   K 3.6 10/06/2018   CL 107 10/06/2018   CREATININE 1.17 10/06/2018   BUN 15 10/06/2018   CO2 26 10/06/2018   TSH 7.170 (H) 10/04/2018   INR 1.10 10/04/2018   HGBA1C 5.8 (H) 10/02/2018    Len Childs, MD 10/06/2018 7:56 AM

## 2018-10-06 NOTE — Op Note (Signed)
David [name], [place] MEDICAL RECORD HE:[id number] ACCOUNT [id number] DATE OF BIRTH:1951-05-08 FACILITY: [facility] LOCATION: [facility] PHYSICIAN:PETER VAN TRIGT III, MD  OPERATIVE REPORT  DATE OF PROCEDURE:  10/06/2018  OPERATION: 1.  Coronary artery bypass grafting x4 (left internal mammary artery to left anterior descending, saphenous vein graft to diagonal, sequential saphenous vein graft to posterior descending and posterolateral branch of the right coronary). 2.  Endoscopic harvest of right leg greater saphenous vein.  SURGEON:  Len Childs, MD  ASSISTANT:  Lars Pinks, PA-C  PREOPERATIVE DIAGNOSES:  Non-ST elevation myocardial infarction, unstable angina, severe multivessel disease.  POSTOPERATIVE DIAGNOSES:  Non-ST elevation myocardial infarction, unstable angina, severe multivessel disease.  CLINICAL NOTE:  The patient is a 67 year old gentleman with symptoms of increasing angina with exercise with accelerating symptoms and general in intensity and frequency.  He was evaluated at an urgent care center and found to have positive troponins and  cardiac enzymes.  He was transferred to [facility] where he was admitted and placed on heparin and underwent evaluation.  Cardiac catheterization demonstrated occlusion of the right coronary with reconstitution of the posterior descending and posterolateral  branches.  The LAD diagonal had a tight 90% stenosis.  The circumflex had no significant disease.  CABG was recommended.  I discussed the procedure of CABG in detail with the patient and his wife including the indications, benefits, alternatives and  risks.  He agreed to proceed with surgery.  His preoperative studies showed no significant carotid disease, PFTs were satisfactory, and echocardiogram showed good LV function without valvular disease.  The patient was brought to the operating room on  10/06/2014 for surgical revascularization.  DESCRIPTION OF PROCEDURE:  The patient was  brought from preop holding after informed consent was documented, and final issues with the family were addressed.  The patient was placed supine on the operating table and general anesthesia was induced.  He  remained stable.  An echo probe was placed by the anesthesia team.  The patient was prepped and draped as a sterile field.  A proper time-out was performed.  A sternal incision was made as the saphenous vein was harvested endoscopically from the right leg.  The left internal mammary artery was harvested as a pedicle graft, and it was a good vessel, approximately 1.7 mm with excellent flow.  The sternal retractor was placed, and the pericardium was opened and suspended.  Pursestrings were placed in the ascending aorta and right atrium.  The patient was heparinized and then placed on cardiopulmonary bypass.  The coronaries were identified for  grafting.  The posterior descending and posterolateral were somewhat small, but adequate targets.  The diagonal and LAD were large vessels and good targets for grafting.  Cardioplegic cannulas were placed, both antegrade and retrograde cold blood  cardioplegia, and the patient was cooled to 32 degrees.  The aortic crossclamp was applied, and a liter of cold blood cardioplegia was delivered in split doses between the antegrade aortic and retrograde coronary sinus catheters.  There was good  cardioplegic arrest and supple temperature dropped less than 12 degrees.  Cardioplegia was delivered every 20 minutes.  The distal coronary anastomoses were performed.  The first distal anastomosis was the sequential vein graft to the posterior descending continuing the posterolateral.  The posterior descending was a 1.5 mm vessel and proximal 99% stenosis.  The vein was sewn side-to-side with running 7-0 Prolene with  good flow through the graft.  This vein then continued as a sequential graft to the posterolateral  branch.  The posterolateral branch was also approximately  1.5-1.7 mm.  In the end, the vein was sewn end-to-side with running 7-0 Prolene.  There was good  flow through the graft, and the cardioplegia was delivered.  The third distal anastomosis was to the diagonal branch of LAD.  There was an ostial 90% stenosis at the takeoff of the diagonal, and a reverse saphenous vein was sewn end-to-side to this 1.5 mm vessel with good flow through the graft.  Cardioplegia was  redosed.  The fourth and final distal anastomosis was to the mid-LAD.  This is a 1.7 mm vessel, proximal 95% stenosis.  The left IMA pedicle was brought through an opening in the left lateral pericardium and was brought down onto the LAD and sewn end-to-side with  running 8-0 Prolene.  There was good flow through the anastomosis after briefly releasing the pedicle bulldog and the mammary artery.  The bulldog was reapplied, and the pedicle was secured to the epicardium.  Cardioplegia was redosed.  While the crossclamp was still in place, 2 proximal vein anastomoses were performed using a 4.5 mm punch and running 6-0 Prolene.  Prior to removing the cross clamp, air was vented from the coronaries with a dose of retrograde warm blood cardioplegia.   The crossclamp was removed.  The heart was cardioverted back to a regular rhythm.  The vein grafts were deaired and opened and each had good flow.  Hemostasis was documented at the proximal and distal anastomoses.  The patient was rewarmed to reperfuse.  Temporary pacing wires were  applied.  The lungs reexpanded.  Ventilator was resumed.  The patient was then weaned off cardiopulmonary bypass without difficulty.  Echo showed preserved LV systolic function.  No inotropes were needed.  The patient remained stable.  Protamine was  administered without adverse reaction.  The superior pericardial fat was closed over the aorta.  The anterior mediastinal left pleural chest tubes were placed.  We spent some time obtaining hemostasis from diffuse bleeding from  the chest wall and  sternum.  The sternal wires were then placed.  The patient remained stable as the sternum was closed.  The pectoralis fascia was closed with a running #1 Vicryl, and the skin and subcutaneous layers were closed with a running Vicryl.  Total  cardiopulmonary bypass time was 120 minutes.  LN/NUANCE  D:10/06/2018 T:10/06/2018 JOB:[id number]

## 2018-10-06 NOTE — Anesthesia Postprocedure Evaluation (Signed)
Anesthesia Post Note  Patient: Pavle Wiler  Procedure(s) Performed: CORONARY ARTERY BYPASS GRAFTING (CABG) x four, using left internal mammary artery and right leg greater saphenous vein harvested endoscopically (N/A Chest) TRANSESOPHAGEAL ECHOCARDIOGRAM (TEE) (N/A )     Patient location during evaluation: SICU Anesthesia Type: General Level of consciousness: sedated Pain management: pain level controlled Vital Signs Assessment: post-procedure vital signs reviewed and stable Respiratory status: patient remains intubated per anesthesia plan Cardiovascular status: stable Postop Assessment: no apparent nausea or vomiting Anesthetic complications: no    Last Vitals:  Vitals:   10/06/18 1430 10/06/18 1445  BP: 118/81   Pulse: 80 80  Resp: 15 14  Temp: (!) 35.3 C (!) 35.2 C  SpO2: 97% 97%    Last Pain:  Vitals:   10/06/18 1445  TempSrc: Core  PainSc:                  Kienna Moncada,W. EDMOND

## 2018-10-06 NOTE — Progress Notes (Signed)
  Echocardiogram 2D Echocardiogram has been performed.  Jennette Dubin 10/06/2018, 9:30 AM

## 2018-10-06 NOTE — Brief Op Note (Signed)
10/02/2018 - 10/06/2018  12:08 PM  PATIENT:  David [name]  67 y.o. male  PRE-OPERATIVE DIAGNOSIS:  CAD  POST-OPERATIVE DIAGNOSIS:  CAD   PROCEDURE:  TRANSESOPHAGEAL ECHOCARDIOGRAM (TEE), MEDIAN STERNOTOMY for  CORONARY ARTERY BYPASS GRAFTING (CABG) x 4 (LIMA to LAD, SVG to DIAGONAL, SVG SEQUENTIALLY to PDA and PLB) with EVH from [place] and LEFT INTERNAL MAMMARY ARTERY  SURGEON:  Surgeon(s) and Role:    Ivin Poot, MD - Primary  PHYSICIAN ASSISTANT: Lars Pinks PA-C  ASSISTANTS: Levonne Lapping RNFA  ANESTHESIA:   general  EBL:  300 ml  BLOOD ADMINISTERED:One FFP and two PLTS  DRAINS: Chest tubes placed in the mediastinal and pleural spaces   COUNTS CORRECT:  YES  DICTATION: .Dragon Dictation  PLAN OF CARE: Admit to inpatient   PATIENT DISPOSITION:  ICU - intubated and hemodynamically stable.   Delay start of Pharmacological VTE agent (>24hrs) due to surgical blood loss or risk of bleeding: yes  BASELINE WEIGHT: 97 kg

## 2018-10-06 NOTE — Progress Notes (Signed)
TCTS BRIEF SICU PROGRESS NOTE  Day of Surgery  S/P Procedure(s) (LRB): CORONARY ARTERY BYPASS GRAFTING (CABG) x four, using left internal mammary artery and right leg greater saphenous vein harvested endoscopically (N/A) TRANSESOPHAGEAL ECHOCARDIOGRAM (TEE) (N/A)   Extubated uneventfully AAI paced w/ stable hemodynamics on low dose levophed O2 sats 98-100% Chest tube output low UOP 75-100 mL/hr Labs okay  Plan: Continue routine early postop  [name], MD 10/06/2018 5:47 PM

## 2018-10-06 NOTE — Anesthesia Procedure Notes (Signed)
Arterial Line Insertion Start/End10/15/2019 7:00 AM, 10/06/2018 9:15 AM Performed by: Imagene Riches, CRNA, CRNA  Preanesthetic checklist: patient identified, IV checked, site marked, risks and benefits discussed, surgical consent, monitors and equipment checked, pre-op evaluation, timeout performed and anesthesia consent Patient sedated Right, radial was placed Catheter size: 20 G Hand hygiene performed  and maximum sterile barriers used   Attempts: 2 Procedure performed without using ultrasound guided technique. Following insertion, Biopatch and dressing applied. Patient tolerated the procedure well with no immediate complications. Additional procedure comments: Attempt on patient left radial artery, unable to thread catheter x2. Local hematoma noted upon catheter removal, manual pressure applied until bleeding stopped. Marland Kitchen

## 2018-10-06 NOTE — Transfer of Care (Signed)
Immediate Anesthesia Transfer of Care Note  Patient: David [name]  Procedure(s) Performed: CORONARY ARTERY BYPASS GRAFTING (CABG) x four, using left internal mammary artery and right leg greater saphenous vein harvested endoscopically (N/A Chest) TRANSESOPHAGEAL ECHOCARDIOGRAM (TEE) (N/A )  Patient Location: SICU  Anesthesia Type:General  Level of Consciousness: sedated  Airway & Oxygen Therapy: Patient remains intubated per anesthesia plan and Patient placed on Ventilator (see vital sign flow sheet for setting)  Post-op Assessment: Report given to RN and Post -op Vital signs reviewed and stable  Post vital signs: Reviewed and stable  Last Vitals:  Vitals Value Taken Time  BP    Temp 35.3 C 10/06/2018  2:19 PM  Pulse 80 10/06/2018  2:19 PM  Resp 13 10/06/2018  2:19 PM  SpO2 95 % 10/06/2018  2:19 PM  Vitals shown include unvalidated device data.  Last Pain:  Vitals:   10/06/18 0543  TempSrc: Oral  PainSc:          Complications: No apparent anesthesia complications

## 2018-10-06 NOTE — Anesthesia Procedure Notes (Signed)
Central Venous Catheter Insertion Performed by: Roberts Gaudy, MD, anesthesiologist Start/End10/15/2019 6:25 AM, 10/06/2018 6:35 AM Patient location: Pre-op. Preanesthetic checklist: patient identified, IV checked, site marked, risks and benefits discussed, surgical consent, monitors and equipment checked, pre-op evaluation, timeout performed and anesthesia consent Lidocaine 1% used for infiltration and patient sedated Hand hygiene performed  and maximum sterile barriers used  Catheter size: 9 Fr MAC introducer Procedure performed using ultrasound guided technique. Ultrasound Notes:anatomy identified, needle tip was noted to be adjacent to the nerve/plexus identified, no ultrasound evidence of intravascular and/or intraneural injection and image(s) printed for medical record Attempts: 1 Following insertion, line sutured and dressing applied. Post procedure assessment: blood return through all ports, free fluid flow and no air  Patient tolerated the procedure well with no immediate complications.

## 2018-10-06 NOTE — Anesthesia Procedure Notes (Signed)
Central Venous Catheter Insertion Performed by: Roberts Gaudy, MD, anesthesiologist Start/End10/15/2019 7:30 AM, 10/06/2018 6:40 AM Patient location: Pre-op. Preanesthetic checklist: patient identified, IV checked, site marked, risks and benefits discussed, surgical consent, monitors and equipment checked, pre-op evaluation, timeout performed and anesthesia consent Hand hygiene performed  and maximum sterile barriers used  PA cath was placed.[place] type:thermodilution Procedure performed without using ultrasound guided technique. Attempts: 1 Following insertion, line sutured, dressing applied and Biopatch. Patient tolerated the procedure well with no immediate complications.

## 2018-10-06 NOTE — Procedures (Signed)
Extubation Procedure Note  Patient Details:   Name: David [name] DOB: [birth year] MRN: [id number]   Airway Documentation:    Vent end date: 10/06/18 Vent end time: 1735   Evaluation  O2 sats: stable throughout Complications: No apparent complications Patient did tolerate procedure well. Bilateral Breath Sounds: Clear, Diminished   Yes   Patient extubated to [place]. Vital signs stable throughout. Patient is tolerating well at this time.  NIF-30 VC-1.4L  RT will continue to monitor.  Mcneil Sober 10/06/2018, 6:06 PM

## 2018-10-07 ENCOUNTER — Encounter (HOSPITAL_COMMUNITY): Payer: Self-pay | Admitting: Cardiothoracic Surgery

## 2018-10-07 ENCOUNTER — Inpatient Hospital Stay (HOSPITAL_COMMUNITY): Payer: Medicare Other

## 2018-10-07 LAB — GLUCOSE, CAPILLARY
GLUCOSE-CAPILLARY: 113 mg/dL — AB (ref 70–99)
GLUCOSE-CAPILLARY: 104 mg/dL — AB (ref 70–99)
GLUCOSE-CAPILLARY: 102 mg/dL — AB (ref 70–99)
GLUCOSE-CAPILLARY: 114 mg/dL — AB (ref 70–99)
Glucose-Capillary: 103 mg/dL — ABNORMAL HIGH (ref 70–99)
Glucose-Capillary: 105 mg/dL — ABNORMAL HIGH (ref 70–99)
Glucose-Capillary: 107 mg/dL — ABNORMAL HIGH (ref 70–99)
Glucose-Capillary: 107 mg/dL — ABNORMAL HIGH (ref 70–99)
Glucose-Capillary: 108 mg/dL — ABNORMAL HIGH (ref 70–99)
Glucose-Capillary: 112 mg/dL — ABNORMAL HIGH (ref 70–99)
Glucose-Capillary: 115 mg/dL — ABNORMAL HIGH (ref 70–99)
Glucose-Capillary: 115 mg/dL — ABNORMAL HIGH (ref 70–99)
Glucose-Capillary: 118 mg/dL — ABNORMAL HIGH (ref 70–99)
Glucose-Capillary: 120 mg/dL — ABNORMAL HIGH (ref 70–99)
Glucose-Capillary: 147 mg/dL — ABNORMAL HIGH (ref 70–99)

## 2018-10-07 LAB — CBC
HCT: 31.8 % — ABNORMAL LOW (ref 39.0–52.0)
HEMATOCRIT: 32.1 % — AB (ref 39.0–52.0)
HEMOGLOBIN: 10.2 g/dL — AB (ref 13.0–17.0)
Hemoglobin: 10.5 g/dL — ABNORMAL LOW (ref 13.0–17.0)
MCH: 28.7 pg (ref 26.0–34.0)
MCH: 29.7 pg (ref 26.0–34.0)
MCHC: 32.1 g/dL (ref 30.0–36.0)
MCHC: 32.7 g/dL (ref 30.0–36.0)
MCV: 89.6 fL (ref 80.0–100.0)
MCV: 90.7 fL (ref 80.0–100.0)
PLATELETS: 108 10*3/uL — AB (ref 150–400)
Platelets: 102 10*3/uL — ABNORMAL LOW (ref 150–400)
RBC: 3.55 MIL/uL — AB (ref 4.22–5.81)
RBC: 3.54 MIL/uL — ABNORMAL LOW (ref 4.22–5.81)
RDW: 13.8 % (ref 11.5–15.5)
RDW: 14.4 % (ref 11.5–15.5)
WBC: 10.3 10*3/uL (ref 4.0–10.5)
WBC: 11.3 10*3/uL — ABNORMAL HIGH (ref 4.0–10.5)
nRBC: 0 % (ref 0.0–0.2)
nRBC: 0 % (ref 0.0–0.2)

## 2018-10-07 LAB — PREPARE FRESH FROZEN PLASMA
Unit division: 0
Unit division: 0

## 2018-10-07 LAB — BASIC METABOLIC PANEL
Anion gap: 8 (ref 5–15)
BUN: 12 mg/dL (ref 8–23)
CHLORIDE: 109 mmol/L (ref 98–111)
CO2: 22 mmol/L (ref 22–32)
Calcium: 7.6 mg/dL — ABNORMAL LOW (ref 8.9–10.3)
Creatinine, Ser: 0.99 mg/dL (ref 0.61–1.24)
GFR calc non Af Amer: >60 mL/min (ref >60)
Glucose, Bld: 102 mg/dL — ABNORMAL HIGH (ref 70–99)
POTASSIUM: 3.5 mmol/L (ref 3.5–5.1)
Sodium: 139 mmol/L (ref 135–145)

## 2018-10-07 LAB — BPAM FFP
BLOOD PRODUCT EXPIRATION DATE: 201910152359
Blood Product Expiration Date: 201910152359
ISSUE DATE / TIME: 201910151208
ISSUE DATE / TIME: 201910151208
UNIT TYPE AND RH: 5100
UNIT TYPE AND RH: 5100

## 2018-10-07 LAB — CREATININE, SERUM
Creatinine, Ser: 1.17 mg/dL (ref 0.61–1.24)
GFR calc Af Amer: >60 mL/min (ref >60)
GFR calc non Af Amer: >60 mL/min (ref >60)

## 2018-10-07 LAB — POCT I-STAT, CHEM 8
BUN: 14 mg/dL (ref 8–23)
CREATININE: 1.2 mg/dL (ref 0.61–1.24)
Calcium, Ion: 1.09 mmol/L — ABNORMAL LOW (ref 1.15–1.40)
Chloride: 103 mmol/L (ref 98–111)
GLUCOSE: 122 mg/dL — AB (ref 70–99)
HEMATOCRIT: 30 % — AB (ref 39.0–52.0)
Hemoglobin: 10.2 g/dL — ABNORMAL LOW (ref 13.0–17.0)
POTASSIUM: 3.8 mmol/L (ref 3.5–5.1)
Sodium: 139 mmol/L (ref 135–145)
TCO2: 25 mmol/L (ref 22–32)

## 2018-10-07 LAB — MAGNESIUM
Magnesium: 2.1 mg/dL (ref 1.7–2.4)
Magnesium: 2.4 mg/dL (ref 1.7–2.4)

## 2018-10-07 MED ORDER — POTASSIUM CHLORIDE 10 MEQ/50ML IV SOLN
10.0000 meq | INTRAVENOUS | Status: AC
  Administered 2018-10-07 (×3): 10 meq via INTRAVENOUS

## 2018-10-07 MED ORDER — BENAZEPRIL HCL 10 MG PO TABS
10.0000 mg | ORAL_TABLET | Freq: Every day | ORAL | Status: DC
  Administered 2018-10-07: 10 mg via ORAL
  Filled 2018-10-07 (×2): qty 1

## 2018-10-07 MED ORDER — INSULIN ASPART 100 UNIT/ML ~~LOC~~ SOLN
0.0000 [IU] | SUBCUTANEOUS | Status: DC
  Administered 2018-10-07 – 2018-10-09 (×5): 2 [IU] via SUBCUTANEOUS

## 2018-10-07 MED ORDER — MECLIZINE HCL 12.5 MG PO TABS
25.0000 mg | ORAL_TABLET | Freq: Two times a day (BID) | ORAL | Status: DC | PRN
  Administered 2018-10-08 – 2018-10-09 (×3): 25 mg via ORAL
  Filled 2018-10-07 (×3): qty 2

## 2018-10-07 MED ORDER — INSULIN DETEMIR 100 UNIT/ML ~~LOC~~ SOLN: 12.0000 [IU] | Freq: Every day | SUBCUTANEOUS | Status: DC

## 2018-10-07 MED ORDER — POTASSIUM CHLORIDE 10 MEQ/50ML IV SOLN
10.0000 meq | INTRAVENOUS | Status: AC
  Administered 2018-10-07 (×2): 10 meq via INTRAVENOUS
  Filled 2018-10-07 (×2): qty 50

## 2018-10-07 MED ORDER — KETOROLAC TROMETHAMINE 30 MG/ML IJ SOLN: 30.0000 mg | Freq: Four times a day (QID) | INTRAMUSCULAR | Status: DC | PRN

## 2018-10-07 MED ORDER — FUROSEMIDE 10 MG/ML IJ SOLN
20.0000 mg | Freq: Four times a day (QID) | INTRAMUSCULAR | Status: DC
  Administered 2018-10-07 – 2018-10-09 (×8): 20 mg via INTRAVENOUS
  Filled 2018-10-07 (×8): qty 2

## 2018-10-07 NOTE — Discharge Summary (Addendum)
Physician Discharge Summary  Patient ID: David [name] MRN: [id number] DOB/AGE: 67/17/1952 67 y.o.  Admit date: 10/02/2018 Discharge date: 10/13/2018  Admission Diagnoses:  Patient Active Problem List   Diagnosis Date Noted  . Coronary artery disease 10/06/2018  . NSTEMI (non-ST elevated myocardial infarction) ([place]) 10/02/2018  . Unstable angina pectoris ([place])   . Elevated troponin    Discharge Diagnoses:   Patient Active Problem List   Diagnosis Date Noted  . S/P CABG x 4 10/06/2018  . Coronary artery disease 10/06/2018  . NSTEMI (non-ST elevated myocardial infarction) ([place]) 10/02/2018  . Unstable angina pectoris ([place])   . Elevated troponin    Discharged Condition: good  History of Present Illness:  Mr. David [name] is a 67 yo retired male with known history of HTN, Dyslipidemia, and family history of CAD.  He is a previous nicotine abuser.  He presented to the hospital with complaints of exertional chest pain.   The pain developed while he was doing yard work, but resolved when he rested.  He presented to an Urgent care EKG and cardiac enzymes were obtained.  Troponin was mildly elevated and he was referred to [facility] for further care and admission.    Hospital Course:   He underwent cardiac catheterization which showed multivessel CAD.  His EF was normal.  Echocardiogram was also obtained which showed normal LV systolic function and no valvular disease.  It was felt coronary bypass grafting would be indicated and TCTS consult was obtained.  He was evaluated by Dr. Prescott Gum who was in agreement the patient would benefit from coronary bypass grafting procedure.  The risks and benefits of the procedure were explained to the patient and he was agreeable to proceed.  He remained chest pain free during hospitalization.  He was taken to the operating room on 10/06/2018.  He underwent CABG x 4 utilizing LIMA to LAD, SVG to Diagonal, and sequential SVG to PDA and Posterolateral branch  of the right coronary artery.  He also underwent endoscopic harvest of greater saphenous vein from his right leg.  He tolerated the procedure without difficulty and was taken to the SICU in stable condition.  He was extubated the evening of surgery.  During his stay in the SICU the patients chest tubes and arterial lines were removed without difficulty.  He developed Atrial Fibrillation and was treated with IV Amiodarone.  Atrial Fibrillation persisted requiring additional Amiodarone bolus and addition of Cardizem for additional rate control.  The patient was experiencing issues with dizziness and persistent nausea.  He was hypotensive and Midodrine was added for additional BP support.  He was able to be stopped and in fact the patient did develop some hypertension.  He was restarted on Norvasc which she was on preoperatively.  He became euvolemic and Lasix was stopped.  His recent creatinine is 1.3 so he has not been started back on his ACE inhibitor or an ARB.  This may be able to restart as an outpatient.  Overall the patient status is felt to be quite good.  His incisions are healing well without evidence of infection.  He has been weaned from oxygen and maintains good saturations on room air.  He is tolerating gradually increasing activities using standard cardiac rehab protocols.  At the time of discharge she is felt to be quite stable.   Significant Diagnostic Studies: angiography:    Severe two-vessel coronary disease with mid LAD 99% stenosis within a calcified segment.  The second diagonal contains 90% stenosis.  The diagonal is large.  Circumflex artery is normal  The RCA is large giving origin to PDA and to left ventricular branches.  It is totally occluded in the mid vessel and also distally beyond the PDA.  Both the PDA and left ventricular branches fill by right to right and left to right collaterals.    The circumflex coronary artery free of obstructive disease.  Left ventricular  function overall appears normal.  EF is at least 50%.  LVEDP is normal.  Treatments: surgery:   1.  Coronary artery bypass grafting x4 (left internal mammary artery to left anterior descending, saphenous vein graft to diagonal, sequential saphenous vein graft to posterior descending and posterolateral branch of the right coronary). 2.  Endoscopic harvest of right leg greater saphenous vein.  Discharge Exam: Blood pressure 123/79, pulse 66, temperature 98.3 F (36.8 C), temperature source Oral, resp. rate (!) 21, height 5\' 10"  (1.778 m), weight 98.3 kg, SpO2 96 %.  General appearance: alert, cooperative and no distress Heart: regular rate and rhythm Lungs: clear to auscultation bilaterally Abdomen: benign Extremities: no edema Wound: incis healing well Disposition: Discharge disposition: 01-Home or Self Care     Home  Discharge Medications:  The patient has been discharged on:   1.Beta Blocker:  Yes [ x  ]                              No   [   ]                              If No, reason:  2.Ace Inhibitor/ARB: Yes [   ]                                     No  [ N   ]                                     If No, reason:MINOR RENAL INSUFF  3.Statin:   Yes [ x  ]                  No  [   ]                  If No, reason:  4.Ecasa:  Yes  [  x ]                  No   [   ]                  If No, reason:    Discharge Instructions    Amb Referral to Cardiac Rehabilitation   Complete by:  As directed    Diagnosis:   CABG NSTEMI     CABG X ___:  4   Discharge patient   Complete by:  As directed    Discharge disposition:  01-Home or Self Care   Discharge patient date:  10/13/2018     Allergies as of 10/13/2018   No Known Allergies     Medication List    STOP taking these medications   benazepril 40 MG tablet Commonly known as:  LOTENSIN   hydrochlorothiazide 25 MG tablet Commonly known as:  HYDRODIURIL     TAKE these medications   amiodarone 200 MG  tablet Commonly known as:  PACERONE Take 1 tablet (200 mg total) by mouth 2 (two) times daily.   amLODipine 5 MG tablet Commonly known as:  NORVASC Take 5 mg by mouth daily.   aspirin 325 MG EC tablet Take 1 tablet (325 mg total) by mouth daily, THEN 1 tablet (325 mg total) daily. Start taking on:  10/13/2018 What changed:    medication strength  See the new instructions.   atorvastatin 80 MG tablet Commonly known as:  LIPITOR Take 1 tablet (80 mg total) by mouth daily at 6 PM. What changed:    medication strength  how much to take  when to take this   metoprolol tartrate 25 MG tablet Commonly known as:  LOPRESSOR Take 0.5 tablets (12.5 mg total) by mouth 2 (two) times daily.   oxyCODONE 5 MG immediate release tablet Commonly known as:  Oxy IR/ROXICODONE Take 1-2 tablets (5-10 mg total) by mouth every 6 (six) hours as needed for up to 5 days for severe pain.   tamsulosin 0.4 MG Caps capsule Commonly known as:  FLOMAX Take 0.4 mg by mouth daily.      Follow-up Information    [facility], [place] Follow up.   Specialties:  Cardiology, Radiology Why:  Cardiology hospital follow up on 10/26/18 at 2:30. Please arrive 15 minutes early for check in.  Contact information: [address] [place] [address] [phone]        [facility] [place] Follow up on 11/09/2018.   Specialty:  Cardiothoracic Surgery Why:  Appointment is at 1:30, please get CXR at 1:00 at [place] located on first floor of our office building Contact information: [place], [place] [address] [phone]          Signed: John Giovanni 10/13/2018, 11:52 AM   patient examined and medical record reviewed,agree with above note. Tharon Aquas Trigt III 10/16/2018

## 2018-10-07 NOTE — Plan of Care (Signed)

## 2018-10-07 NOTE — Progress Notes (Signed)
EVENING ROUNDS NOTE :     [address]       [place] [address]             [phone]                 1 Day Post-Op Procedure(s) (LRB): CORONARY ARTERY BYPASS GRAFTING (CABG) x four, using left internal mammary artery and right leg greater saphenous vein harvested endoscopically (N/A) TRANSESOPHAGEAL ECHOCARDIOGRAM (TEE) (N/A)  Total Length of Stay:  LOS: 4 days  BP (!) 92/55   Pulse (!) 59   Temp 99.7 F (37.6 C)   Resp 12   Ht 5\' 10"  (1.778 m)   Wt 104.8 kg   SpO2 98%   BMI 33.15 kg/m   .Intake/Output      10/16 0701 - 10/17 0700   P.O. 300   I.V. (mL/kg) 70.3 (0.7)   Blood    NG/GT    IV Piggyback 262   Total Intake(mL/kg) 632.3 (6)   Urine (mL/kg/hr) 1825 (1.4)   Emesis/NG output    Blood    Chest Tube 200   Total Output 2025   Net -1392.7         . sodium chloride Stopped (10/07/18 0723)  . sodium chloride    . sodium chloride 20 mL/hr at 10/06/18 1457  . cefUROXime (ZINACEF)  IV 1.5 g (10/07/18 1030)  . lactated ringers    . lactated ringers Stopped (10/07/18 0957)  . nitroGLYCERIN    . norepinephrine (LEVOPHED) Adult infusion Stopped (10/06/18 2159)     Lab Results  Component Value Date   WBC 11.3 (H) 10/07/2018   HGB 10.2 (L) 10/07/2018   HCT 30.0 (L) 10/07/2018   PLT 108 (L) 10/07/2018   GLUCOSE 122 (H) 10/07/2018   CHOL 140 10/02/2018   TRIG 74 10/02/2018   HDL 34 (L) 10/02/2018   LDLCALC 91 10/02/2018   ALT 19 10/04/2018   AST 20 10/04/2018   NA 139 10/07/2018   K 3.8 10/07/2018   CL 103 10/07/2018   CREATININE 1.20 10/07/2018   BUN 14 10/07/2018   CO2 22 10/07/2018   TSH 7.170 (H) 10/04/2018   INR 1.40 10/06/2018   HGBA1C 5.8 (H) 10/02/2018   Nausea Sinus, no afib    Grace Isaac MD  Beeper [phone] Office [phone] 10/07/2018 7:21 PM

## 2018-10-07 NOTE — Progress Notes (Signed)
1 Day Post-Op Procedure(s) (LRB): CORONARY ARTERY BYPASS GRAFTING (CABG) x four, using left internal mammary artery and right leg greater saphenous vein harvested endoscopically (N/A) TRANSESOPHAGEAL ECHOCARDIOGRAM (TEE) (N/A) Subjective: Extubated, c/o pain nsr  Objective: Vital signs in last 24 hours: Temp:  [95.4 F (35.2 C)-99.7 F (37.6 C)] 99.7 F (37.6 C) (10/16 0800) Pulse Rate:  [69-93] 70 (10/16 0800) Cardiac Rhythm: Atrial paced;Heart block (10/16 0800) Resp:  [11-28] 18 (10/16 0800) BP: (89-118)/(53-91) 97/65 (10/16 0700) SpO2:  [92 %-99 %] 92 % (10/16 0800) Arterial Line BP: (91-140)/(55-86) 129/62 (10/16 0800) FiO2 (%):  [40 %-50 %] 40 % (10/15 1700) Weight:  [97.3 kg-104.8 kg] 104.8 kg (10/16 0500)  Hemodynamic parameters for last 24 hours: PAP: (17-35)/(9-20) 26/17 CO:  [3.4 L/min-5.8 L/min] 5.8 L/min CI:  [1.6 L/min/m2-2.7 L/min/m2] 2.7 L/min/m2  Intake/Output from previous day: 10/15 0701 - 10/16 0700 In: 5396.2 [I.V.:3855.6; Blood:531; IV Piggyback:1009.6] Out: 4270 [Urine:2955; Blood:605; Chest Tube:710] Intake/Output this shift: Total I/O In: 59.5 [I.V.:29.5; IV Piggyback:30] Out: 50 [Chest Tube:50]       Exam    General- alert and comfortable    Neck- no JVD, no cervical adenopathy palpable, no carotid bruit   Lungs- clear without rales, wheezes   Cor- regular rate and rhythm, no murmur , gallop   Abdomen- soft, non-tender   Extremities - warm, non-tender, minimal edema   Neuro- oriented, appropriate, no focal weakness   Lab Results: Recent Labs    10/06/18 1938 10/06/18 2002 10/07/18 0409  WBC 13.0*  --  10.3  HGB 10.9* 10.2* 10.2*  HCT 32.8* 30.0* 31.8*  PLT 113*  --  102*   BMET:  Recent Labs    10/06/18 0419  10/06/18 2002 10/07/18 0409  NA 142   < > 143 139  K 3.6   < > 3.9 3.5  CL 107   < > 107 109  CO2 26  --   --  22  GLUCOSE 113*   < > 120* 102*  BUN 15   < > 13 12  CREATININE 1.17   < > 0.90 0.99  CALCIUM 9.0  --    --  7.6*   < > = values in this interval not displayed.    PT/INR:  Recent Labs    10/06/18 1425  LABPROT 17.0*  INR 1.40   ABG    Component Value Date/Time   PHART 7.369 10/06/2018 1844   HCO3 21.0 10/06/2018 1844   TCO2 22 10/06/2018 2002   ACIDBASEDEF 4.0 (H) 10/06/2018 1844   O2SAT 94.0 10/06/2018 1844   CBG (last 3)  Recent Labs    10/07/18 0505 10/07/18 0606 10/07/18 0739  GLUCAP 102* 118* 108*    Assessment/Plan: S/P Procedure(s) (LRB): CORONARY ARTERY BYPASS GRAFTING (CABG) x four, using left internal mammary artery and right leg greater saphenous vein harvested endoscopically (N/A) TRANSESOPHAGEAL ECHOCARDIOGRAM (TEE) (N/A) Mobilize Diuresis Diabetes control d/c tubes/lines See progression orders   LOS: 4 days    David [name] 10/07/2018

## 2018-10-08 ENCOUNTER — Inpatient Hospital Stay (HOSPITAL_COMMUNITY): Payer: Medicare Other

## 2018-10-08 DIAGNOSIS — I480 Paroxysmal atrial fibrillation: Secondary | ICD-10-CM

## 2018-10-08 LAB — CBC
HCT: 30.6 % — ABNORMAL LOW (ref 39.0–52.0)
Hemoglobin: 9.8 g/dL — ABNORMAL LOW (ref 13.0–17.0)
MCH: 29.5 pg (ref 26.0–34.0)
MCHC: 32 g/dL (ref 30.0–36.0)
MCV: 92.2 fL (ref 80.0–100.0)
Platelets: 96 10*3/uL — ABNORMAL LOW (ref 150–400)
RBC: 3.32 MIL/uL — ABNORMAL LOW (ref 4.22–5.81)
RDW: 14.3 % (ref 11.5–15.5)
WBC: 12.2 10*3/uL — ABNORMAL HIGH (ref 4.0–10.5)
nRBC: 0 % (ref 0.0–0.2)

## 2018-10-08 LAB — POCT I-STAT, CHEM 8
BUN: 19 mg/dL (ref 8–23)
CHLORIDE: 100 mmol/L (ref 98–111)
CREATININE: 1.2 mg/dL (ref 0.61–1.24)
Calcium, Ion: 1.08 mmol/L — ABNORMAL LOW (ref 1.15–1.40)
GLUCOSE: 124 mg/dL — AB (ref 70–99)
HCT: 28 % — ABNORMAL LOW (ref 39.0–52.0)
HEMOGLOBIN: 9.5 g/dL — AB (ref 13.0–17.0)
Potassium: 3.6 mmol/L (ref 3.5–5.1)
Sodium: 137 mmol/L (ref 135–145)
TCO2: 25 mmol/L (ref 22–32)

## 2018-10-08 LAB — BASIC METABOLIC PANEL
Anion gap: 9 (ref 5–15)
BUN: 17 mg/dL (ref 8–23)
CO2: 24 mmol/L (ref 22–32)
Calcium: 7.8 mg/dL — ABNORMAL LOW (ref 8.9–10.3)
Chloride: 104 mmol/L (ref 98–111)
Creatinine, Ser: 1.25 mg/dL — ABNORMAL HIGH (ref 0.61–1.24)
GFR calc Af Amer: >60 mL/min (ref >60)
GFR calc non Af Amer: 58 mL/min — ABNORMAL LOW (ref >60)
Glucose, Bld: 126 mg/dL — ABNORMAL HIGH (ref 70–99)
Potassium: 3.6 mmol/L (ref 3.5–5.1)
Sodium: 137 mmol/L (ref 135–145)

## 2018-10-08 LAB — GLUCOSE, CAPILLARY
GLUCOSE-CAPILLARY: 115 mg/dL — AB (ref 70–99)
GLUCOSE-CAPILLARY: 147 mg/dL — AB (ref 70–99)
GLUCOSE-CAPILLARY: 117 mg/dL — AB (ref 70–99)
Glucose-Capillary: 114 mg/dL — ABNORMAL HIGH (ref 70–99)

## 2018-10-08 MED ORDER — MAGNESIUM SULFATE 2 GM/50ML IV SOLN
2.0000 g | Freq: Once | INTRAVENOUS | Status: AC
  Administered 2018-10-08: 2 g via INTRAVENOUS
  Filled 2018-10-08: qty 50

## 2018-10-08 MED ORDER — MAGNESIUM SULFATE 50 % IJ SOLN: 2.0000 g | Freq: Once | INTRAMUSCULAR | Status: DC

## 2018-10-08 MED ORDER — AMIODARONE HCL IN DEXTROSE 360-4.14 MG/200ML-% IV SOLN
30.0000 mg/h | INTRAVENOUS | Status: DC
  Administered 2018-10-08 – 2018-10-11 (×6): 30 mg/h via INTRAVENOUS
  Filled 2018-10-08 (×7): qty 200

## 2018-10-08 MED ORDER — AMIODARONE IV BOLUS ONLY 150 MG/100ML
150.0000 mg | Freq: Once | INTRAVENOUS | Status: AC
  Administered 2018-10-08: 150 mg via INTRAVENOUS
  Filled 2018-10-08: qty 100

## 2018-10-08 MED ORDER — POTASSIUM CHLORIDE 10 MEQ/50ML IV SOLN
10.0000 meq | INTRAVENOUS | Status: AC | PRN
  Administered 2018-10-08 (×3): 10 meq via INTRAVENOUS
  Filled 2018-10-08 (×3): qty 50

## 2018-10-08 MED ORDER — POTASSIUM CHLORIDE 10 MEQ/50ML IV SOLN
10.0000 meq | INTRAVENOUS | Status: DC | PRN
  Administered 2018-10-09: 10 meq via INTRAVENOUS
  Filled 2018-10-08 (×3): qty 50

## 2018-10-08 MED ORDER — POTASSIUM CHLORIDE 10 MEQ/50ML IV SOLN
10.0000 meq | INTRAVENOUS | Status: AC
  Administered 2018-10-08 (×2): 10 meq via INTRAVENOUS
  Filled 2018-10-08 (×2): qty 50

## 2018-10-08 MED ORDER — POTASSIUM CHLORIDE 10 MEQ/50ML IV SOLN
10.0000 meq | INTRAVENOUS | Status: AC
  Administered 2018-10-08: 10 meq via INTRAVENOUS
  Filled 2018-10-08: qty 50

## 2018-10-08 MED ORDER — AMIODARONE HCL IN DEXTROSE 360-4.14 MG/200ML-% IV SOLN
60.0000 mg/h | INTRAVENOUS | Status: AC
  Administered 2018-10-08 (×2): 60 mg/h via INTRAVENOUS
  Filled 2018-10-08 (×2): qty 200

## 2018-10-08 MED FILL — Heparin Sodium (Porcine) Inj 1000 Unit/ML: INTRAMUSCULAR | Qty: 30 | Status: AC

## 2018-10-08 MED FILL — Potassium Chloride Inj 2 mEq/ML: INTRAVENOUS | Qty: 40 | Status: AC

## 2018-10-08 MED FILL — Lidocaine HCl(Cardiac) IV PF Soln Pref Syr 100 MG/5ML (2%): INTRAVENOUS | Qty: 10 | Status: AC

## 2018-10-08 MED FILL — Magnesium Sulfate Inj 50%: INTRAMUSCULAR | Qty: 10 | Status: AC

## 2018-10-08 MED FILL — Electrolyte-R (PH 7.4) Solution: INTRAVENOUS | Qty: 5000 | Status: AC

## 2018-10-08 MED FILL — Mannitol IV Soln 20%: INTRAVENOUS | Qty: 500 | Status: AC

## 2018-10-08 MED FILL — Sodium Chloride IV Soln 0.9%: INTRAVENOUS | Qty: 2000 | Status: AC

## 2018-10-08 MED FILL — Sodium Bicarbonate IV Soln 8.4%: INTRAVENOUS | Qty: 50 | Status: AC

## 2018-10-08 MED FILL — Heparin Sodium (Porcine) Inj 1000 Unit/ML: INTRAMUSCULAR | Qty: 10 | Status: AC

## 2018-10-08 NOTE — Progress Notes (Signed)
2 Days Post-Op Procedure(s) (LRB): CORONARY ARTERY BYPASS GRAFTING (CABG) x four, using left internal mammary artery and right leg greater saphenous vein harvested endoscopically (N/A) TRANSESOPHAGEAL ECHOCARDIOGRAM (TEE) (N/A) Subjective: Remains weak Now in afib this am  cxr clear  Objective: Vital signs in last 24 hours: Temp:  [98.3 F (36.8 C)-99.7 F (37.6 C)] 98.7 F (37.1 C) (10/17 0728) Pulse Rate:  [58-74] 68 (10/17 0800) Cardiac Rhythm: Atrial fibrillation (10/17 0800) Resp:  [10-29] 12 (10/17 0800) BP: (84-123)/(52-75) 85/59 (10/17 0800) SpO2:  [86 %-99 %] 93 % (10/17 0800) Arterial Line BP: (129)/(63) 129/63 (10/16 0900) Weight:  [102.4 kg] 102.4 kg (10/17 0500)  Hemodynamic parameters for last 24 hours: PAP: (27)/(13) 27/13  Intake/Output from previous day: 10/16 0701 - 10/17 0700 In: 836.5 [P.O.:300; I.V.:170.3; IV Piggyback:366.2] Out: 3085 [Urine:2885; Chest Tube:200] Intake/Output this shift: Total I/O In: -  Out: 150 [Urine:150]       Exam    General- alert and comfortable    Neck- no JVD, no cervical adenopathy palpable, no carotid bruit   Lungs- clear without rales, wheezes   Cor- regular rate and rhythm, no murmur , gallop   Abdomen- soft, non-tender   Extremities - warm, non-tender, minimal edema   Neuro- oriented, appropriate, no focal weakness   Lab Results: Recent Labs    10/07/18 1612 10/07/18 1617 10/08/18 0352  WBC 11.3*  --  12.2*  HGB 10.5* 10.2* 9.8*  HCT 32.1* 30.0* 30.6*  PLT 108*  --  96*   BMET:  Recent Labs    10/07/18 0409  10/07/18 1617 10/08/18 0352  NA 139  --  139 137  K 3.5  --  3.8 3.6  CL 109  --  103 104  CO2 22  --   --  24  GLUCOSE 102*  --  122* 126*  BUN 12  --  14 17  CREATININE 0.99   < > 1.20 1.25*  CALCIUM 7.6*  --   --  7.8*   < > = values in this interval not displayed.    PT/INR:  Recent Labs    10/06/18 1425  LABPROT 17.0*  INR 1.40   ABG    Component Value Date/Time   PHART  7.369 10/06/2018 1844   HCO3 21.0 10/06/2018 1844   TCO2 25 10/07/2018 1617   ACIDBASEDEF 4.0 (H) 10/06/2018 1844   O2SAT 94.0 10/06/2018 1844   CBG (last 3)  Recent Labs    10/07/18 2326 10/08/18 0352 10/08/18 0735  GLUCAP 147* 115* 114*    Assessment/Plan: S/P Procedure(s) (LRB): CORONARY ARTERY BYPASS GRAFTING (CABG) x four, using left internal mammary artery and right leg greater saphenous vein harvested endoscopically (N/A) TRANSESOPHAGEAL ECHOCARDIOGRAM (TEE) (N/A) Mobilize Diuresis Diabetes control amiodarone protocol   LOS: 5 days    Tharon Aquas Trigt III 10/08/2018

## 2018-10-08 NOTE — Care Management (Signed)
#  1 S/W KENDRA @ [facility] [phone]  ELIQUIS 5 MG BID AND 2.5 MG BID  COVER: YES CO-PAY: $45.00 FOR EACH RX TIER: 3 DRUG PRIOR APPROVAL: NO PREFERRED PHARMACY: YES [facility] M/O 90 DAY SUPPLY FOR M/O $125.00

## 2018-10-08 NOTE — Progress Notes (Signed)
Progress Note  Patient Name: David [name] Date of Encounter: 10/08/2018  Primary Cardiologist: Sanda Klein, MD    Subjective   67 year old gentleman with a history of coronary artery disease.  He is status post coronary artery bypass grafting.  He developed atrial fibrillation overnight.  His blood pressures been running a little bit low.  He is having some nausea.  He is having some difficulty eating.  Inpatient Medications    Scheduled Meds: . acetaminophen  1,000 mg Oral Q6H   Or  . acetaminophen (TYLENOL) oral liquid 160 mg/5 mL  1,000 mg Per Tube Q6H  . aspirin EC  325 mg Oral Daily   Or  . aspirin  324 mg Per Tube Daily  . atorvastatin  80 mg Oral q1800  . bisacodyl  10 mg Oral Daily   Or  . bisacodyl  10 mg Rectal Daily  . chlorhexidine gluconate (MEDLINE KIT)  15 mL Mouth Rinse BID  . docusate sodium  200 mg Oral Daily  . furosemide  20 mg Intravenous Q6H  . insulin aspart  0-24 Units Subcutaneous Q4H  . mouth rinse  15 mL Mouth Rinse BID  . metoCLOPramide (REGLAN) injection  10 mg Intravenous Q6H  . metoprolol tartrate  12.5 mg Oral BID   Or  . metoprolol tartrate  12.5 mg Per Tube BID  . pantoprazole  40 mg Oral Daily  . sodium chloride flush  3 mL Intravenous Q12H  . tamsulosin  0.4 mg Oral QPC supper   Continuous Infusions: . sodium chloride 20 mL/hr at 10/07/18 2000  . sodium chloride    . sodium chloride 20 mL/hr at 10/06/18 1457  . amiodarone 60 mg/hr (10/08/18 0853)  . amiodarone    . cefUROXime (ZINACEF)  IV Stopped (10/07/18 2157)  . lactated ringers    . lactated ringers Stopped (10/07/18 0957)  . magnesium sulfate 1 - 4 g bolus IVPB 2 g (10/08/18 0851)  . potassium chloride 10 mEq (10/08/18 0747)  . potassium chloride     PRN Meds: sodium chloride, ketorolac, meclizine, metoprolol tartrate, morphine injection, ondansetron (ZOFRAN) IV, oxyCODONE, potassium chloride, sodium chloride flush, traMADol   Vital Signs    Vitals:   10/08/18  0600 10/08/18 0700 10/08/18 0728 10/08/18 0800  BP: (!) 112/59 (!) 85/61  (!) 85/59  Pulse: 70 66  68  Resp: 19 14  12   Temp:   98.7 F (37.1 C)   TempSrc:   Oral   SpO2: 93% 93%  93%  Weight:      Height:        Intake/Output Summary (Last 24 hours) at 10/08/2018 0856 Last data filed at 10/08/2018 0800 Gross per 24 hour  Intake 776.99 ml  Output 3110 ml  Net -2333.01 ml   Filed Weights   10/06/18 1430 10/07/18 0500 10/08/18 0500  Weight: 97.3 kg 104.8 kg 102.4 kg    Telemetry    Atrial fib  - Personally Reviewed  ECG     atrial fib  - Personally Reviewed  Physical Exam   GEN: No acute distress.   Neck: No JVD Cardiac:  Irregularly irregular Respiratory: Clear to auscultation bilaterally. GI: Soft, nontender, non-distended  MS: No edema; No deformity. Neuro:  Nonfocal  Psych: Normal affect   Labs    Chemistry Recent Labs  Lab 10/04/18 0421  10/06/18 0419  10/07/18 0409 10/07/18 1612 10/07/18 1617 10/08/18 0352  NA 141   < > 142   < > 139  --  139 137  K 3.7   < > 3.6   < > 3.5  --  3.8 3.6  CL 109   < > 107   < > 109  --  103 104  CO2 24   < > 26  --  22  --   --  24  GLUCOSE 110*   < > 113*   < > 102*  --  122* 126*  BUN 15   < > 15   < > 12  --  14 17  CREATININE 1.15   < > 1.17   < > 0.99 1.17 1.20 1.25*  CALCIUM 8.8*   < > 9.0  --  7.6*  --   --  7.8*  PROT 6.5  --   --   --   --   --   --   --   ALBUMIN 3.8  --   --   --   --   --   --   --   AST 20  --   --   --   --   --   --   --   ALT 19  --   --   --   --   --   --   --   ALKPHOS 43  --   --   --   --   --   --   --   BILITOT 1.1  --   --   --   --   --   --   --   GFRNONAA >60   < > >60  --  >60 >60  --  58*  GFRAA >60   < > >60  --  >60 >60  --  >60  ANIONGAP 8   < > 9  --  8  --   --  9   < > = values in this interval not displayed.     Hematology Recent Labs  Lab 10/07/18 0409 10/07/18 1612 10/07/18 1617 10/08/18 0352  WBC 10.3 11.3*  --  12.2*  RBC 3.55* 3.54*  --  3.32*   HGB 10.2* 10.5* 10.2* 9.8*  HCT 31.8* 32.1* 30.0* 30.6*  MCV 89.6 90.7  --  92.2  MCH 28.7 29.7  --  29.5  MCHC 32.1 32.7  --  32.0  RDW 13.8 14.4  --  14.3  PLT 102* 108*  --  96*    Cardiac EnzymesNo results for input(s): TROPONINI in the last 168 hours.  Recent Labs  Lab 10/02/18 0525 10/02/18 0839  TROPIPOC 0.23* 0.18*     BNPNo results for input(s): BNP, PROBNP in the last 168 hours.   DDimer No results for input(s): DDIMER in the last 168 hours.   Radiology    Dg Chest Port 1 View  Result Date: 10/07/2018 CLINICAL DATA:  Chest tube.  Status post CABG EXAM: PORTABLE CHEST 1 VIEW COMPARISON:  10/06/2018 FINDINGS: Cardiac enlargement. Status post median sternotomy. Swan-Ganz catheter tip is in the projection of the proximal right pulmonary artery. There is a mediastinal drain and left chest tube in place. No significant pneumothorax identified. Atelectasis is identified within both lung bases. IMPRESSION: 1. Left chest tube in place without pneumothorax. 2. Bibasilar atelectasis. Electronically Signed   By: Kerby Moors M.D.   On: 10/07/2018 10:33   Dg Chest Port 1 View  Result Date: 10/06/2018 CLINICAL DATA:  67 year old male postoperative day zero status post CABG. EXAM:  PORTABLE CHEST 1 VIEW COMPARISON:  1353 hours today and earlier. FINDINGS: Portable AP semi upright view at 1437 hours. Endotracheal tube tip in good position just above the clavicles. Stable left chest tube. Right IJ Swan-Ganz catheter remains in place with tip at the main pulmonary artery level. Partially visible mediastinal tube. Enteric tube courses toward the abdomen, tip not included. Mildly improved lung volumes. No pneumothorax or pulmonary edema. Decreased perihilar but continued left lung base opacity compatible with atelectasis. Mediastinal contour within normal limits. IMPRESSION: 1.  Stable lines and tubes. 2. No pneumothorax or pulmonary edema.  Left lung base atelectasis. Electronically Signed    By: Genevie Ann M.D.   On: 10/06/2018 14:50   Dg Chest Portable 1 View  Result Date: 10/06/2018 CLINICAL DATA:  Status post CABG EXAM: PORTABLE CHEST 1 VIEW COMPARISON:  10/02/2018 FINDINGS: Endotracheal tube with the tip 5.6 cm above the carina. Swan-Ganz catheter with the tip projecting over the right ventricular outflow tract. Mediastinal drains are noted. Interval CABG. Bilateral mild interstitial thickening. Left basilar atelectasis with trace left pleural effusion. No pneumothorax. Left-sided chest tube in satisfactory position. Stable cardiomediastinal silhouette. IMPRESSION: 1. Endotracheal tube with the tip 5.6 cm above the carina. 2. Swan-Ganz catheter with the tip projecting over the right ventricular outflow tract. 3. Mild bilateral interstitial thickening likely reflecting mild interstitial edema. Interval CABG. Electronically Signed   By: Kathreen Devoid   On: 10/06/2018 14:28    Cardiac Studies     Patient Profile     67 y.o. male with coronary artery disease who is now postop day 2 following bypass surgery.  He has developed atrial fibrillation:  Assessment & Plan    1.  Coronary artery disease: Patient is status post coronary artery bypass grafting 2 days ago.  He is developed atrial fibrillation.  Pressure is a bit soft today.  I encouraged him to eat more.   2.  Atrial fibrillation: He will be started on amiodarone.  He may  need to be started on Eliquis at some point.  I will defer the timing of Eliquis initiation to the surgeons.       For questions or updates, please contact [place] Please consult [email or web address] for contact info under        Signed, Mertie Moores, MD  10/08/2018, 8:56 AM

## 2018-10-08 NOTE — Progress Notes (Signed)
POD # 2 CABG  Up in chair  BP (!) 102/59   Pulse (!) 59   Temp 98.6 F (37 C) (Oral)   Resp 14   Ht 5\' 10"  (1.778 m)   Wt 102.4 kg   SpO2 95%   BMI 32.39 kg/m    Intake/Output Summary (Last 24 hours) at 10/08/2018 2047 Last data filed at 10/08/2018 1900 Gross per 24 hour  Intake 1385.39 ml  Output 1710 ml  Net -324.61 ml   Creatinine 1.2 this PM  Maddax Palinkas C. Roxan Hockey, MD [facility] [phone]

## 2018-10-09 ENCOUNTER — Inpatient Hospital Stay (HOSPITAL_COMMUNITY): Payer: Medicare Other

## 2018-10-09 ENCOUNTER — Inpatient Hospital Stay: Payer: Self-pay

## 2018-10-09 LAB — BASIC METABOLIC PANEL
Anion gap: 11 (ref 5–15)
BUN: 18 mg/dL (ref 8–23)
CO2: 25 mmol/L (ref 22–32)
Calcium: 7.8 mg/dL — ABNORMAL LOW (ref 8.9–10.3)
Chloride: 99 mmol/L (ref 98–111)
Creatinine, Ser: 1.12 mg/dL (ref 0.61–1.24)
GFR calc Af Amer: >60 mL/min (ref >60)
GFR calc non Af Amer: >60 mL/min (ref >60)
Glucose, Bld: 108 mg/dL — ABNORMAL HIGH (ref 70–99)
Potassium: 3.2 mmol/L — ABNORMAL LOW (ref 3.5–5.1)
Sodium: 135 mmol/L (ref 135–145)

## 2018-10-09 LAB — BPAM RBC
Blood Product Expiration Date: 201911122359
Blood Product Expiration Date: 201911122359
Unit Type and Rh: 5100
Unit Type and Rh: 5100

## 2018-10-09 LAB — GLUCOSE, CAPILLARY
GLUCOSE-CAPILLARY: 111 mg/dL — AB (ref 70–99)
GLUCOSE-CAPILLARY: 114 mg/dL — AB (ref 70–99)
Glucose-Capillary: 100 mg/dL — ABNORMAL HIGH (ref 70–99)
Glucose-Capillary: 107 mg/dL — ABNORMAL HIGH (ref 70–99)
Glucose-Capillary: 115 mg/dL — ABNORMAL HIGH (ref 70–99)
Glucose-Capillary: 115 mg/dL — ABNORMAL HIGH (ref 70–99)
Glucose-Capillary: 121 mg/dL — ABNORMAL HIGH (ref 70–99)
Glucose-Capillary: 125 mg/dL — ABNORMAL HIGH (ref 70–99)

## 2018-10-09 LAB — CBC
HCT: 28.9 % — ABNORMAL LOW (ref 39.0–52.0)
Hemoglobin: 9 g/dL — ABNORMAL LOW (ref 13.0–17.0)
MCH: 28.8 pg (ref 26.0–34.0)
MCHC: 31.1 g/dL (ref 30.0–36.0)
MCV: 92.3 fL (ref 80.0–100.0)
Platelets: 93 10*3/uL — ABNORMAL LOW (ref 150–400)
RBC: 3.13 MIL/uL — ABNORMAL LOW (ref 4.22–5.81)
RDW: 14.2 % (ref 11.5–15.5)
WBC: 11.1 10*3/uL — ABNORMAL HIGH (ref 4.0–10.5)
nRBC: 0 % (ref 0.0–0.2)

## 2018-10-09 LAB — TYPE AND SCREEN
ABO/RH(D): O POS
Antibody Screen: NEGATIVE
Unit division: 0
Unit division: 0

## 2018-10-09 MED ORDER — FUROSEMIDE 10 MG/ML IJ SOLN
40.0000 mg | Freq: Every day | INTRAMUSCULAR | Status: DC
  Administered 2018-10-09 – 2018-10-10 (×2): 40 mg via INTRAVENOUS
  Filled 2018-10-09 (×2): qty 4

## 2018-10-09 MED ORDER — AMIODARONE IV BOLUS ONLY 150 MG/100ML: 150.0000 mg | Freq: Once | INTRAVENOUS | Status: DC

## 2018-10-09 MED ORDER — DILTIAZEM HCL-DEXTROSE 100-5 MG/100ML-% IV SOLN (PREMIX)
5.0000 mg/h | INTRAVENOUS | Status: DC
  Administered 2018-10-09 (×2): 5 mg/h via INTRAVENOUS
  Filled 2018-10-09 (×3): qty 100

## 2018-10-09 MED ORDER — AMIODARONE LOAD VIA INFUSION
150.0000 mg | Freq: Once | INTRAVENOUS | Status: AC
  Administered 2018-10-09: 150 mg via INTRAVENOUS
  Filled 2018-10-09: qty 83.34

## 2018-10-09 MED ORDER — SODIUM CHLORIDE 0.9% FLUSH
10.0000 mL | INTRAVENOUS | Status: DC | PRN
  Administered 2018-10-11: 20 mL
  Filled 2018-10-09: qty 40

## 2018-10-09 MED ORDER — POTASSIUM CHLORIDE 10 MEQ/50ML IV SOLN
10.0000 meq | INTRAVENOUS | Status: DC | PRN
  Administered 2018-10-09 (×2): 10 meq via INTRAVENOUS
  Filled 2018-10-09: qty 50

## 2018-10-09 MED ORDER — CHLORHEXIDINE GLUCONATE CLOTH 2 % EX PADS
6.0000 | MEDICATED_PAD | Freq: Every day | CUTANEOUS | Status: DC
  Administered 2018-10-09 – 2018-10-12 (×4): 6 via TOPICAL

## 2018-10-09 MED ORDER — DIMENHYDRINATE 50 MG PO TABS
50.0000 mg | ORAL_TABLET | Freq: Four times a day (QID) | ORAL | Status: DC | PRN
  Administered 2018-10-09: 50 mg via ORAL
  Filled 2018-10-09 (×2): qty 1

## 2018-10-09 MED ORDER — FUROSEMIDE 10 MG/ML IJ SOLN: 40.0000 mg | Freq: Two times a day (BID) | INTRAMUSCULAR | Status: DC

## 2018-10-09 MED ORDER — ENOXAPARIN SODIUM 40 MG/0.4ML ~~LOC~~ SOLN
40.0000 mg | SUBCUTANEOUS | Status: DC
  Administered 2018-10-09 – 2018-10-12 (×4): 40 mg via SUBCUTANEOUS
  Filled 2018-10-09 (×4): qty 0.4

## 2018-10-09 MED ORDER — MIDODRINE HCL 5 MG PO TABS
10.0000 mg | ORAL_TABLET | Freq: Three times a day (TID) | ORAL | Status: DC
  Administered 2018-10-09 (×2): 10 mg via ORAL
  Filled 2018-10-09 (×3): qty 2

## 2018-10-09 MED ORDER — SODIUM CHLORIDE 0.9 % IV SOLN
INTRAVENOUS | Status: AC
  Administered 2018-10-09: 10:00:00 via INTRAVENOUS

## 2018-10-09 MED ORDER — SODIUM CHLORIDE 0.9% FLUSH
10.0000 mL | Freq: Two times a day (BID) | INTRAVENOUS | Status: DC
  Administered 2018-10-09 – 2018-10-12 (×4): 10 mL

## 2018-10-09 NOTE — Progress Notes (Signed)
Peripherally Inserted Central Catheter/Midline Placement  The IV Nurse has discussed with the patient and/or persons authorized to consent for the patient, the purpose of this procedure and the potential benefits and risks involved with this procedure.  The benefits include less needle sticks, lab draws from the catheter, and the patient may be discharged home with the catheter. Risks include, but not limited to, infection, bleeding, blood clot (thrombus formation), and puncture of an artery; nerve damage and irregular heartbeat and possibility to perform a PICC exchange if needed/ordered by physician.  Alternatives to this procedure were also discussed.  Bard Power PICC patient education guide, fact sheet on infection prevention and patient information card has been provided to patient /or left at bedside.    PICC/Midline Placement Documentation  PICC Double Lumen 10/09/18 PICC Right Brachial 43 cm 0 cm (Active)  Indication for Insertion or Continuance of Line Vasoactive infusions 10/09/2018 11:11 AM  Exposed Catheter (cm) 0 cm 10/09/2018 11:11 AM  Site Assessment Clean;Dry;Intact 10/09/2018 11:11 AM  Lumen #1 Status Flushed;Blood return noted 10/09/2018 11:11 AM  Lumen #2 Status Flushed;Blood return noted 10/09/2018 11:11 AM  Dressing Type Transparent 10/09/2018 11:11 AM  Dressing Status Clean;Dry;Intact;Antimicrobial disc in place 10/09/2018 11:11 AM  Dressing Intervention New dressing 10/09/2018 11:11 AM  Dressing Change Due 10/16/18 10/09/2018 11:11 AM       David [name] 10/09/2018, 11:13 AM

## 2018-10-09 NOTE — Progress Notes (Addendum)
Attempted to walk patient this evening, but became dizzy and nauseated after standing for couple minutes. Pt stated he couldn't make the walk and was transferred back to bed. Remained hemodynamically stable when standing. Pt was premedicated with meclizine and zofran prior to getting up. Will try again in AM.

## 2018-10-09 NOTE — Progress Notes (Signed)
3 Days Post-Op Procedure(s) (LRB): CORONARY ARTERY BYPASS GRAFTING (CABG) x four, using left internal mammary artery and right leg greater saphenous vein harvested endoscopically (N/A) TRANSESOPHAGEAL ECHOCARDIOGRAM (TEE) (N/A) Subjective: Recurrent afib- bolus amio, add low dose cardizem drip cxr better Still dizzy, nauseated with abulation- midodrine, dramamine added lovenox for now, may need coumadin Objective: Vital signs in last 24 hours: Temp:  [97.8 F (36.6 C)-99.6 F (37.6 C)] 99 F (37.2 C) (10/18 0739) Pulse Rate:  [58-110] 86 (10/18 0800) Cardiac Rhythm: Atrial fibrillation (10/18 0800) Resp:  [11-25] 17 (10/18 0800) BP: (77-132)/(49-77) 97/49 (10/18 0800) SpO2:  [90 %-97 %] 95 % (10/18 0800) Weight:  [101.6 kg] 101.6 kg (10/18 0500)  Hemodynamic parameters for last 24 hours:  afib-nsr  Intake/Output from previous day: 10/17 0701 - 10/18 0700 In: 1480.9 [P.O.:310; I.V.:772.1; IV Piggyback:398.9] Out: 1750 [Urine:1750] Intake/Output this shift: No intake/output data recorded.       Exam    General- alert and comfortable    Neck- no JVD, no cervical adenopathy palpable, no carotid bruit   Lungs- clear without rales, wheezes   Cor- regular rate and rhythm, no murmur , gallop   Abdomen- soft, non-tender   Extremities - warm, non-tender, minimal edema   Neuro- oriented, appropriate, no focal weakness   Lab Results: Recent Labs    10/08/18 0352 10/08/18 1545 10/09/18 0430  WBC 12.2*  --  11.1*  HGB 9.8* 9.5* 9.0*  HCT 30.6* 28.0* 28.9*  PLT 96*  --  93*   BMET:  Recent Labs    10/08/18 0352 10/08/18 1545 10/09/18 0430  NA 137 137 135  K 3.6 3.6 3.2*  CL 104 100 99  CO2 24  --  25  GLUCOSE 126* 124* 108*  BUN 17 19 18   CREATININE 1.25* 1.20 1.12  CALCIUM 7.8*  --  7.8*    PT/INR:  Recent Labs    10/06/18 1425  LABPROT 17.0*  INR 1.40   ABG    Component Value Date/Time   PHART 7.369 10/06/2018 1844   HCO3 21.0 10/06/2018 1844   TCO2  25 10/08/2018 1545   ACIDBASEDEF 4.0 (H) 10/06/2018 1844   O2SAT 94.0 10/06/2018 1844   CBG (last 3)  Recent Labs    10/08/18 2351 10/09/18 0407 10/09/18 0737  GLUCAP 121* 115* 107*    Assessment/Plan: S/P Procedure(s) (LRB): CORONARY ARTERY BYPASS GRAFTING (CABG) x four, using left internal mammary artery and right leg greater saphenous vein harvested endoscopically (N/A) TRANSESOPHAGEAL ECHOCARDIOGRAM (TEE) (N/A) Mobilize Diuresis treat afib, keep in ICU   LOS: 6 days    David [name] 10/09/2018

## 2018-10-09 NOTE — Progress Notes (Signed)
[facility] BRIEF SICU PROGRESS NOTE  3 Days Post-Op  S/P Procedure(s) (LRB): CORONARY ARTERY BYPASS GRAFTING (CABG) x four, using left internal mammary artery and right leg greater saphenous vein harvested endoscopically (N/A) TRANSESOPHAGEAL ECHOCARDIOGRAM (TEE) (N/A)   Stable day.  Ambulated once. Still in and out of rate-controlled Afib BP stable UOP adequate  Plan: Continue current plan  [name], MD 10/09/2018 5:38 PM

## 2018-10-09 NOTE — Plan of Care (Signed)
  Problem: Education: Goal: Knowledge of General Education information will improve Description Including pain rating scale, medication(s)/side effects and non-pharmacologic comfort measures Outcome: Progressing   Problem: Health Behavior/Discharge Planning: Goal: Ability to manage health-related needs will improve Outcome: Progressing   Problem: Clinical Measurements: Goal: Ability to maintain clinical measurements within normal limits will improve Outcome: Progressing Goal: Diagnostic test results will improve Outcome: Progressing Goal: Respiratory complications will improve Outcome: Progressing Goal: Cardiovascular complication will be avoided Outcome: Progressing   Problem: Activity: Goal: Risk for activity intolerance will decrease Outcome: Progressing   Problem: Coping: Goal: Level of anxiety will decrease Outcome: Progressing   Problem: Pain Managment: Goal: General experience of comfort will improve Outcome: Progressing

## 2018-10-09 NOTE — Progress Notes (Signed)
Progress Note  Patient Name: David [name] Date of Encounter: 10/09/2018  Primary Cardiologist: Sanda Klein, MD    Subjective   68 year old gentleman with a history of coronary artery disease.  He is status post coronary artery bypass grafting.  He developed atrial fibrillation overnight.  His blood pressure continues to run low.  He still in atrial fibrillation.  He is been started on amiodarone drip and also diltiazem drip.  His appetite is been poor.  He is not eating well for several days.  Will give him a IV normal saline bolus of 500 cc.  Inpatient Medications    Scheduled Meds: . acetaminophen  1,000 mg Oral Q6H   Or  . acetaminophen (TYLENOL) oral liquid 160 mg/5 mL  1,000 mg Per Tube Q6H  . aspirin EC  325 mg Oral Daily   Or  . aspirin  324 mg Per Tube Daily  . atorvastatin  80 mg Oral q1800  . bisacodyl  10 mg Oral Daily   Or  . bisacodyl  10 mg Rectal Daily  . docusate sodium  200 mg Oral Daily  . enoxaparin (LOVENOX) injection  40 mg Subcutaneous Q24H  . furosemide  40 mg Intravenous Daily  . insulin aspart  0-24 Units Subcutaneous Q4H  . metoCLOPramide (REGLAN) injection  10 mg Intravenous Q6H  . metoprolol tartrate  12.5 mg Oral BID   Or  . metoprolol tartrate  12.5 mg Per Tube BID  . midodrine  10 mg Oral TID WC  . pantoprazole  40 mg Oral Daily  . sodium chloride flush  3 mL Intravenous Q12H  . tamsulosin  0.4 mg Oral QPC supper   Continuous Infusions: . sodium chloride 10 mL/hr at 10/09/18 0746  . sodium chloride    . sodium chloride 20 mL/hr at 10/06/18 1457  . amiodarone 30 mg/hr (10/09/18 0700)  . diltiazem (CARDIZEM) infusion 5 mg/hr (10/09/18 0839)  . lactated ringers    . potassium chloride    . potassium chloride 10 mEq (10/09/18 0857)   PRN Meds: sodium chloride, dimenhyDRINATE, metoprolol tartrate, ondansetron (ZOFRAN) IV, oxyCODONE, potassium chloride, potassium chloride, sodium chloride flush, traMADol   Vital Signs    Vitals:    10/09/18 0800 10/09/18 0825 10/09/18 0834 10/09/18 0843  BP: (!) 97/49 110/66 (!) 88/62 108/88  Pulse: 86 74 99 69  Resp: 17 17 17 12   Temp:      TempSrc:      SpO2: 95% 96% 94% 95%  Weight:      Height:        Intake/Output Summary (Last 24 hours) at 10/09/2018 0902 Last data filed at 10/09/2018 0700 Gross per 24 hour  Intake 1344.8 ml  Output 1600 ml  Net -255.2 ml   Filed Weights   10/07/18 0500 10/08/18 0500 10/09/18 0500  Weight: 104.8 kg 102.4 kg 101.6 kg    Telemetry    Atrial fibrillation  ECG     atrial fib  - Personally Reviewed  Physical Exam   Physical Exam: Blood pressure 108/88, pulse 69, temperature 99 F (37.2 C), temperature source Oral, resp. rate 12, height 5\' 10"  (1.778 m), weight 101.6 kg, SpO2 95 %.  GEN: Middle-aged gentleman, no acute distress HEENT: Normal NECK: No JVD; No carotid bruits LYMPHATICS: No lymphadenopathy CARDIAC: Irregularly irregular, sternal wound is healing. RESPIRATORY:  Clear to auscultation without rales, wheezing or rhonchi  ABDOMEN: Soft, non-tender, non-distended MUSCULOSKELETAL:  No edema; No deformity  SKIN: Warm and dry NEUROLOGIC:  Alert and oriented x 3   Labs    Chemistry Recent Labs  Lab 10/04/18 0421  10/07/18 0409 10/07/18 1612  10/08/18 0352 10/08/18 1545 10/09/18 0430  NA 141   < > 139  --    < > 137 137 135  K 3.7   < > 3.5  --    < > 3.6 3.6 3.2*  CL 109   < > 109  --    < > 104 100 99  CO2 24   < > 22  --   --  24  --  25  GLUCOSE 110*   < > 102*  --    < > 126* 124* 108*  BUN 15   < > 12  --    < > 17 19 18   CREATININE 1.15   < > 0.99 1.17   < > 1.25* 1.20 1.12  CALCIUM 8.8*   < > 7.6*  --   --  7.8*  --  7.8*  PROT 6.5  --   --   --   --   --   --   --   ALBUMIN 3.8  --   --   --   --   --   --   --   AST 20  --   --   --   --   --   --   --   ALT 19  --   --   --   --   --   --   --   ALKPHOS 43  --   --   --   --   --   --   --   BILITOT 1.1  --   --   --   --   --   --   --     GFRNONAA >60   < > >60 >60  --  58*  --  >60  GFRAA >60   < > >60 >60  --  >60  --  >60  ANIONGAP 8   < > 8  --   --  9  --  11   < > = values in this interval not displayed.     Hematology Recent Labs  Lab 10/07/18 1612  10/08/18 0352 10/08/18 1545 10/09/18 0430  WBC 11.3*  --  12.2*  --  11.1*  RBC 3.54*  --  3.32*  --  3.13*  HGB 10.5*   < > 9.8* 9.5* 9.0*  HCT 32.1*   < > 30.6* 28.0* 28.9*  MCV 90.7  --  92.2  --  92.3  MCH 29.7  --  29.5  --  28.8  MCHC 32.7  --  32.0  --  31.1  RDW 14.4  --  14.3  --  14.2  PLT 108*  --  96*  --  93*   < > = values in this interval not displayed.    Cardiac EnzymesNo results for input(s): TROPONINI in the last 168 hours.  No results for input(s): TROPIPOC in the last 168 hours.   BNPNo results for input(s): BNP, PROBNP in the last 168 hours.   DDimer No results for input(s): DDIMER in the last 168 hours.   Radiology    Dg Chest Port 1 View  Result Date: 10/09/2018 CLINICAL DATA:  Follow-up coronary bypass grafting EXAM: PORTABLE CHEST 1 VIEW COMPARISON:  10/08/2018 FINDINGS: Cardiac shadow is prominent. Postsurgical changes are again  seen. Right jugular sheath is noted. Mild vascular congestion is noted which has increased in the interval from the prior exam. Left retrocardiac consolidation is noted. The previously seen left apical pneumothorax is no longer identified. IMPRESSION: Mild vascular congestion as well as left basilar consolidation Electronically Signed   By: Inez Catalina M.D.   On: 10/09/2018 08:24   Dg Chest Port 1 View  Result Date: 10/08/2018 CLINICAL DATA:  Short of breath EXAM: PORTABLE CHEST 1 VIEW COMPARISON:  10/07/2018 FINDINGS: The heart is moderately enlarged. Vascular congestion. Low volumes. Airspace disease at the left base and right basilar atelectasis are noted. Swan-Ganz catheter removed with the right introducer left in place. Chest and mediastinal tubes have been removed. Tiny left apical pneumothorax  is stable in retrospect. No evidence of right pneumothorax. IMPRESSION: Chest tubes removed with tiny left apical pneumothorax. Low volumes with right basilar atelectasis and left basilar airspace disease. Electronically Signed   By: Marybelle Killings M.D.   On: 10/08/2018 09:22   [place] Ekg Site Rite  Result Date: 10/09/2018 If Site Rite image not attached, placement could not be confirmed due to current cardiac rhythm.   Cardiac Studies     Patient Profile     67 y.o. male with coronary artery disease who is now postop day 2 following bypass surgery.  He has developed atrial fibrillation:  Assessment & Plan    1.  Coronary artery disease: Patient is doing well following bypass surgery.  Is not had any complications.  He is in atrial fibrillation.  He is been started on amiodarone.   2.  Atrial fibrillation: Continue amiodarone.  He is on a low-dose diltiazem drip and also metoprolol.  We are limited in how much metoprolol and diltiazem we can give by his hypotension.  I will give him 500 cc of normal saline bolus and see if he responds well to that.      For questions or updates, please contact [place] Please consult [email or web address] for contact info under        Signed, Mertie Moores, MD  10/09/2018, 9:02 AM

## 2018-10-09 NOTE — Discharge Instructions (Signed)

## 2018-10-10 DIAGNOSIS — I4891 Unspecified atrial fibrillation: Secondary | ICD-10-CM

## 2018-10-10 LAB — BASIC METABOLIC PANEL
Anion gap: 9 (ref 5–15)
BUN: 17 mg/dL (ref 8–23)
CALCIUM: 7.7 mg/dL — AB (ref 8.9–10.3)
CO2: 26 mmol/L (ref 22–32)
CREATININE: 1.11 mg/dL (ref 0.61–1.24)
Chloride: 101 mmol/L (ref 98–111)
GFR calc non Af Amer: >60 mL/min (ref >60)
Glucose, Bld: 153 mg/dL — ABNORMAL HIGH (ref 70–99)
Potassium: 3.1 mmol/L — ABNORMAL LOW (ref 3.5–5.1)
Sodium: 136 mmol/L (ref 135–145)

## 2018-10-10 LAB — CBC
HEMATOCRIT: 27.4 % — AB (ref 39.0–52.0)
Hemoglobin: 8.8 g/dL — ABNORMAL LOW (ref 13.0–17.0)
MCH: 28.9 pg (ref 26.0–34.0)
MCHC: 32.1 g/dL (ref 30.0–36.0)
MCV: 89.8 fL (ref 80.0–100.0)
NRBC: 0 % (ref 0.0–0.2)
PLATELETS: 131 10*3/uL — AB (ref 150–400)
RBC: 3.05 MIL/uL — ABNORMAL LOW (ref 4.22–5.81)
RDW: 13.9 % (ref 11.5–15.5)
WBC: 9.1 10*3/uL (ref 4.0–10.5)

## 2018-10-10 LAB — GLUCOSE, CAPILLARY
GLUCOSE-CAPILLARY: 91 mg/dL (ref 70–99)
GLUCOSE-CAPILLARY: 109 mg/dL — AB (ref 70–99)
Glucose-Capillary: 100 mg/dL — ABNORMAL HIGH (ref 70–99)
Glucose-Capillary: 97 mg/dL (ref 70–99)

## 2018-10-10 MED ORDER — POTASSIUM CHLORIDE 10 MEQ/50ML IV SOLN
10.0000 meq | Freq: Once | INTRAVENOUS | Status: AC
  Administered 2018-10-10: 10 meq via INTRAVENOUS
  Filled 2018-10-10: qty 50

## 2018-10-10 MED ORDER — POTASSIUM CHLORIDE 10 MEQ/50ML IV SOLN
10.0000 meq | INTRAVENOUS | Status: AC
  Administered 2018-10-10 (×6): 10 meq via INTRAVENOUS
  Filled 2018-10-10 (×5): qty 50

## 2018-10-10 MED ORDER — INSULIN ASPART 100 UNIT/ML ~~LOC~~ SOLN: 0.0000 [IU] | Freq: Three times a day (TID) | SUBCUTANEOUS | Status: DC

## 2018-10-10 MED ORDER — SODIUM CHLORIDE 0.9 % IV SOLN: 250.0000 mL | INTRAVENOUS | Status: DC | PRN

## 2018-10-10 MED ORDER — FUROSEMIDE 40 MG PO TABS
40.0000 mg | ORAL_TABLET | Freq: Two times a day (BID) | ORAL | Status: DC
  Administered 2018-10-11: 40 mg via ORAL
  Filled 2018-10-10: qty 1

## 2018-10-10 MED ORDER — MOVING RIGHT ALONG BOOK
Freq: Once | Status: AC
  Administered 2018-10-10: 11:00:00
  Filled 2018-10-10: qty 1

## 2018-10-10 MED ORDER — SODIUM CHLORIDE 0.9% FLUSH: 3.0000 mL | INTRAVENOUS | Status: DC | PRN

## 2018-10-10 MED ORDER — POTASSIUM CHLORIDE CRYS ER 20 MEQ PO TBCR
40.0000 meq | EXTENDED_RELEASE_TABLET | Freq: Every day | ORAL | Status: DC
  Administered 2018-10-10 – 2018-10-12 (×3): 40 meq via ORAL
  Filled 2018-10-10 (×3): qty 2

## 2018-10-10 MED ORDER — FUROSEMIDE 10 MG/ML IJ SOLN
40.0000 mg | Freq: Every day | INTRAMUSCULAR | Status: DC
  Filled 2018-10-10: qty 4

## 2018-10-10 MED ORDER — SODIUM CHLORIDE 0.9% FLUSH
3.0000 mL | Freq: Two times a day (BID) | INTRAVENOUS | Status: DC
  Administered 2018-10-11: 3 mL via INTRAVENOUS

## 2018-10-10 NOTE — Progress Notes (Addendum)
Patient ID: David [name], male   DOB: 03-23-1951, 67 y.o.   MRN: [id number]     Advanced Heart Failure Rounding Note  PCP-Cardiologist: Sanda Klein, MD   Subjective:    No complaints this morning other than taste/smell alterations post-op.  He went back into NSR overnight.  BP stable.  No dyspnea or chest pain.    Objective:   Weight Range: 101.6 kg Body mass index is 32.14 kg/m.   Vital Signs:   Temp:  [98.2 F (36.8 C)-98.7 F (37.1 C)] 98.7 F (37.1 C) (10/19 0715) Pulse Rate:  [35-105] 58 (10/19 0700) Resp:  [12-23] 21 (10/19 0700) BP: (79-157)/(49-107) 116/67 (10/19 0700) SpO2:  [89 %-98 %] 92 % (10/19 0700) Last BM Date: 10/09/18  Weight change: Filed Weights   10/07/18 0500 10/08/18 0500 10/09/18 0500  Weight: 104.8 kg 102.4 kg 101.6 kg    Intake/Output:   Intake/Output Summary (Last 24 hours) at 10/10/2018 0739 Last data filed at 10/10/2018 0530 Gross per 24 hour  Intake 3018.09 ml  Output 865 ml  Net 2153.09 ml      Physical Exam    General:  Well appearing. No resp difficulty HEENT: Normal Neck: Supple. JVP 8 cm. Carotids 2+ bilat; no bruits. No lymphadenopathy or thyromegaly appreciated. Cor: PMI nondisplaced. Regular rate & rhythm. No rubs, gallops or murmurs. Lungs: Clear Abdomen: Soft, nontender, nondistended. No hepatosplenomegaly. No bruits or masses. Good bowel sounds. Extremities: No cyanosis, clubbing, rash, edema Neuro: Alert & orientedx3, cranial nerves grossly intact. moves all 4 extremities w/o difficulty. Affect pleasant   Telemetry   NSR in 60s (personally reviewed)  Labs    CBC Recent Labs    10/08/18 0352 10/08/18 1545 10/09/18 0430  WBC 12.2*  --  11.1*  HGB 9.8* 9.5* 9.0*  HCT 30.6* 28.0* 28.9*  MCV 92.2  --  92.3  PLT 96*  --  93*   Basic Metabolic Panel Recent Labs    10/07/18 1612  10/08/18 0352 10/08/18 1545 10/09/18 0430  NA  --    < > 137 137 135  K  --    < > 3.6 3.6 3.2*  CL  --    < > 104 100  99  CO2  --   --  24  --  25  GLUCOSE  --    < > 126* 124* 108*  BUN  --    < > 17 19 18   CREATININE 1.17   < > 1.25* 1.20 1.12  CALCIUM  --   --  7.8*  --  7.8*  MG 2.4  --   --   --   --    < > = values in this interval not displayed.   Liver Function Tests No results for input(s): AST, ALT, ALKPHOS, BILITOT, PROT, ALBUMIN in the last 72 hours. No results for input(s): LIPASE, AMYLASE in the last 72 hours. Cardiac Enzymes No results for input(s): CKTOTAL, CKMB, CKMBINDEX, TROPONINI in the last 72 hours.  BNP: BNP (last 3 results) No results for input(s): BNP in the last 8760 hours.  ProBNP (last 3 results) No results for input(s): PROBNP in the last 8760 hours.   D-Dimer No results for input(s): DDIMER in the last 72 hours. Hemoglobin A1C No results for input(s): HGBA1C in the last 72 hours. Fasting Lipid Panel No results for input(s): CHOL, HDL, LDLCALC, TRIG, CHOLHDL, LDLDIRECT in the last 72 hours. Thyroid Function Tests No results for input(s): TSH, T4TOTAL, T3FREE, THYROIDAB in  the last 72 hours.  Invalid input(s): FREET3  Other results:   Imaging    [place] Ekg Site Rite  Result Date: 10/09/2018 If Site Rite image not attached, placement could not be confirmed due to current cardiac rhythm.     Medications:     Scheduled Medications: . acetaminophen  1,000 mg Oral Q6H   Or  . acetaminophen (TYLENOL) oral liquid 160 mg/5 mL  1,000 mg Per Tube Q6H  . aspirin EC  325 mg Oral Daily   Or  . aspirin  324 mg Per Tube Daily  . atorvastatin  80 mg Oral q1800  . bisacodyl  10 mg Oral Daily   Or  . bisacodyl  10 mg Rectal Daily  . Chlorhexidine Gluconate Cloth  6 each Topical Daily  . docusate sodium  200 mg Oral Daily  . enoxaparin (LOVENOX) injection  40 mg Subcutaneous Q24H  . furosemide  40 mg Intravenous Daily  . insulin aspart  0-24 Units Subcutaneous Q4H  . metoCLOPramide (REGLAN) injection  10 mg Intravenous Q6H  . metoprolol tartrate  12.5 mg Oral  BID   Or  . metoprolol tartrate  12.5 mg Per Tube BID  . pantoprazole  40 mg Oral Daily  . sodium chloride flush  10-40 mL Intracatheter Q12H  . sodium chloride flush  3 mL Intravenous Q12H  . tamsulosin  0.4 mg Oral QPC supper     Infusions: . sodium chloride 10 mL/hr at 10/10/18 0500  . sodium chloride    . sodium chloride 20 mL/hr at 10/06/18 1457  . amiodarone 30 mg/hr (10/10/18 0500)  . lactated ringers    . potassium chloride Stopped (10/09/18 1111)  . potassium chloride 10 mEq (10/09/18 0857)     PRN Medications:  sodium chloride, dimenhyDRINATE, metoprolol tartrate, ondansetron (ZOFRAN) IV, oxyCODONE, potassium chloride, potassium chloride, sodium chloride flush, sodium chloride flush, traMADol    Patient Profile   67 yo with NSTEMI now s/p CABG x 4.   Assessment/Plan   1. CAD: NSTEMI at admission, now s/p CABG x 4.  Progressing nicely post-op.  - Continue statin.  - Continue ASA.  2. Atrial fibrillation: Post-op afib, had difficulty with high rate/low BP.  Today, he is back in NSR on amiodarone gtt.  BP stable.  - Continue amiodarone gtt today, possibly to po tomorrow if he remains in NSR.  - Stop diltiazem gtt.  - Stop midodrine with stable BP.  - Can continue low dose metoprolol  - Not anticoagulated at this point.  If afib occurs again, will need to anticoagulate.  3. Acute diastolic CHF: Echo with EF 55-60%. He is getting IV Lasix daily.   - Needs BMET today.  - Can probably transition to po Lasix after today.   Length of Stay: 7  Loralie Champagne, MD  10/10/2018, 7:39 AM  Advanced Heart Failure Team Pager [phone] (M-F; 7a - 4p)  Please contact [facility] for night-coverage after hours (4p -7a ) and weekends on [email or web address]

## 2018-10-10 NOTE — Progress Notes (Addendum)
      [address]       [place] [address]             [phone]        CARDIOTHORACIC SURGERY PROGRESS NOTE   R4 Days Post-Op Procedure(s) (LRB): CORONARY ARTERY BYPASS GRAFTING (CABG) x four, using left internal mammary artery and right leg greater saphenous vein harvested endoscopically (N/A) TRANSESOPHAGEAL ECHOCARDIOGRAM (TEE) (N/A)  Subjective: Looks good and reports feeling well.  No dizziness.  No pain.  No SOB.  Marginal appetite.  Objective: Vital signs: BP Readings from Last 1 Encounters:  10/10/18 128/69   Pulse Readings from Last 1 Encounters:  10/10/18 (!) 58   Resp Readings from Last 1 Encounters:  10/10/18 15   Temp Readings from Last 1 Encounters:  10/10/18 98.7 F (37.1 C) (Oral)    Hemodynamics:    Physical Exam:  Rhythm:   sinus  Breath sounds: clear  Heart sounds:  RRR  Incisions:  Clean and dry  Abdomen:  Soft, non-distended, non-tender  Extremities:  Warm, well-perfused    Intake/Output from previous day: 10/18 0701 - 10/19 0700 In: 3018.1 [P.O.:720; I.V.:2195.8; IV Piggyback:102.3] Out: 865 [Urine:865] Intake/Output this shift: Total I/O In: 120 [P.O.:120] Out: -   Lab Results:  CBC: Recent Labs    10/09/18 0430 10/10/18 0848  WBC 11.1* 9.1  HGB 9.0* 8.8*  HCT 28.9* 27.4*  PLT 93* 131*    BMET:  Recent Labs    10/09/18 0430 10/10/18 0848  NA 135 136  K 3.2* 3.1*  CL 99 101  CO2 25 26  GLUCOSE 108* 153*  BUN 18 17  CREATININE 1.12 1.11  CALCIUM 7.8* 7.7*     PT/INR:  No results for input(s): LABPROT, INR in the last 72 hours.  CBG (last 3)  Recent Labs    10/09/18 1934 10/09/18 2322 10/10/18 0717  GLUCAP 100* 114* 100*    ABG    Component Value Date/Time   PHART 7.369 10/06/2018 1844   PCO2ART 36.4 10/06/2018 1844   PO2ART 75.0 (L) 10/06/2018 1844   HCO3 21.0 10/06/2018 1844   TCO2 25 10/08/2018 1545   ACIDBASEDEF 4.0 (H) 10/06/2018 1844   O2SAT 94.0 10/06/2018 1844     CXR: N/a  EKG: NSR w/out acute ischemic changes, QT slightly prolonged   Assessment/Plan: S/P Procedure(s) (LRB): CORONARY ARTERY BYPASS GRAFTING (CABG) x four, using left internal mammary artery and right leg greater saphenous vein harvested endoscopically (N/A) TRANSESOPHAGEAL ECHOCARDIOGRAM (TEE) (N/A)  Overall stable Currently maintaining NSR w/ stable BP Breathing comfortably w/ O2 sats 96% on 2 L/min Post op Afib, currently maintaining NSR on IV amiodarone Hypokalemia, induced by loop diuretics Acute on chronic diastolic CHF with expected post-op volume excess, I/O positive 2 liters yesterday, needs diuresis   Mobilize  Diuresis  Supplement potassium  Continue IV amiodarone today but watch QT/QTc  Start anticoagulation if PAF recurrs  Transfer step down   [name], MD 10/10/2018 9:59 AM

## 2018-10-11 ENCOUNTER — Inpatient Hospital Stay (HOSPITAL_COMMUNITY): Payer: Medicare Other

## 2018-10-11 LAB — CBC WITH DIFFERENTIAL/PLATELET
Abs Immature Granulocytes: 0.07 10*3/uL (ref 0.00–0.07)
Basophils Absolute: 0 10*3/uL (ref 0.0–0.1)
Basophils Relative: 0 %
EOS ABS: 0.3 10*3/uL (ref 0.0–0.5)
EOS PCT: 4 %
HEMATOCRIT: 27.6 % — AB (ref 39.0–52.0)
HEMOGLOBIN: 9.1 g/dL — AB (ref 13.0–17.0)
Immature Granulocytes: 1 %
LYMPHS ABS: 1.1 10*3/uL (ref 0.7–4.0)
Lymphocytes Relative: 14 %
MCH: 29.7 pg (ref 26.0–34.0)
MCHC: 33 g/dL (ref 30.0–36.0)
MCV: 90.2 fL (ref 80.0–100.0)
MONO ABS: 0.6 10*3/uL (ref 0.1–1.0)
Monocytes Relative: 8 %
NEUTROS ABS: 5.7 10*3/uL (ref 1.7–7.7)
NEUTROS PCT: 73 %
Platelets: 156 10*3/uL (ref 150–400)
RBC: 3.06 MIL/uL — ABNORMAL LOW (ref 4.22–5.81)
RDW: 13.8 % (ref 11.5–15.5)
WBC: 7.9 10*3/uL (ref 4.0–10.5)
nRBC: 0 % (ref 0.0–0.2)

## 2018-10-11 LAB — BASIC METABOLIC PANEL
Anion gap: 9 (ref 5–15)
BUN: 18 mg/dL (ref 8–23)
CHLORIDE: 101 mmol/L (ref 98–111)
CO2: 26 mmol/L (ref 22–32)
CREATININE: 1.08 mg/dL (ref 0.61–1.24)
Calcium: 7.9 mg/dL — ABNORMAL LOW (ref 8.9–10.3)
GFR calc non Af Amer: >60 mL/min (ref >60)
Glucose, Bld: 104 mg/dL — ABNORMAL HIGH (ref 70–99)
Potassium: 3.3 mmol/L — ABNORMAL LOW (ref 3.5–5.1)
Sodium: 136 mmol/L (ref 135–145)

## 2018-10-11 LAB — GLUCOSE, CAPILLARY
Glucose-Capillary: 109 mg/dL — ABNORMAL HIGH (ref 70–99)
Glucose-Capillary: 102 mg/dL — ABNORMAL HIGH (ref 70–99)

## 2018-10-11 MED ORDER — AMIODARONE HCL 200 MG PO TABS
200.0000 mg | ORAL_TABLET | Freq: Two times a day (BID) | ORAL | Status: DC
  Administered 2018-10-12 – 2018-10-13 (×3): 200 mg via ORAL
  Filled 2018-10-11 (×3): qty 1

## 2018-10-11 MED ORDER — FUROSEMIDE 40 MG PO TABS
40.0000 mg | ORAL_TABLET | Freq: Every day | ORAL | Status: DC
  Administered 2018-10-12: 40 mg via ORAL
  Filled 2018-10-11: qty 1

## 2018-10-11 MED ORDER — AMIODARONE HCL 200 MG PO TABS: 200.0000 mg | ORAL_TABLET | Freq: Two times a day (BID) | ORAL | Status: DC

## 2018-10-11 MED ORDER — CHLORHEXIDINE GLUCONATE 0.12 % MT SOLN
OROMUCOSAL | Status: AC
  Filled 2018-10-11: qty 15

## 2018-10-11 MED ORDER — POTASSIUM CHLORIDE 10 MEQ/50ML IV SOLN
10.0000 meq | INTRAVENOUS | Status: AC
  Administered 2018-10-11 (×3): 10 meq via INTRAVENOUS
  Filled 2018-10-11 (×3): qty 50

## 2018-10-11 NOTE — Progress Notes (Signed)
Patient ID: David [name], male   DOB: 10/21/1951, 67 y.o.   MRN: [id number]  Patient has remained in NSR on amiodarone.  Continue post-op per TCTS.   Loralie Champagne 10/11/2018

## 2018-10-11 NOTE — Progress Notes (Addendum)
TCTS DAILY ICU PROGRESS NOTE                   [address]            [place] [address]          [phone]   5 Days Post-Op Procedure(s) (LRB): CORONARY ARTERY BYPASS GRAFTING (CABG) x four, using left internal mammary artery and right leg greater saphenous vein harvested endoscopically (N/A) TRANSESOPHAGEAL ECHOCARDIOGRAM (TEE) (N/A)  Total Length of Stay:  LOS: 8 days   Subjective:  No new complaints.  Feeling better.  Walking around unit without much difficulty.  Objective: Vital signs in last 24 hours: Temp:  [97.7 F (36.5 C)-98.9 F (37.2 C)] 97.7 F (36.5 C) (10/20 1130) Pulse Rate:  [58] 58 (10/19 2205) Cardiac Rhythm: Normal sinus rhythm;Ventricular paced (10/20 0800) Resp:  [13-25] 22 (10/20 1300) BP: (102-123)/(61-83) 105/68 (10/20 1300) SpO2:  [93 %-95 %] 95 % (10/19 2010) Weight:  [101.5 kg] 101.5 kg (10/20 0500)  Filed Weights   10/09/18 0500 10/10/18 1200 10/11/18 0500  Weight: 101.6 kg 101.7 kg 101.5 kg    Weight change:     Intake/Output from previous day: 10/19 0701 - 10/20 0700 In: 1677.9 [P.O.:924; I.V.:478.4; IV Piggyback:275.6] Out: 1500 [Urine:1500]  Intake/Output this shift: Total I/O In: 161.3 [I.V.:116; IV Piggyback:45.3] Out: 325 [Urine:325]  Current Meds: Scheduled Meds: . acetaminophen  1,000 mg Oral Q6H   Or  . acetaminophen (TYLENOL) oral liquid 160 mg/5 mL  1,000 mg Per Tube Q6H  . amiodarone  200 mg Oral BID  . aspirin EC  325 mg Oral Daily  . atorvastatin  80 mg Oral q1800  . bisacodyl  10 mg Oral Daily   Or  . bisacodyl  10 mg Rectal Daily  . Chlorhexidine Gluconate Cloth  6 each Topical Daily  . docusate sodium  200 mg Oral Daily  . enoxaparin (LOVENOX) injection  40 mg Subcutaneous Q24H  . furosemide  40 mg Intravenous Daily  . [START ON 10/12/2018] furosemide  40 mg Oral Daily  . metoprolol tartrate  12.5 mg Oral BID  . pantoprazole  40 mg Oral Daily  . potassium chloride  40 mEq Oral Daily    . sodium chloride flush  10-40 mL Intracatheter Q12H  . sodium chloride flush  3 mL Intravenous Q12H  . sodium chloride flush  3 mL Intravenous Q12H  . tamsulosin  0.4 mg Oral QPC supper   Continuous Infusions: . sodium chloride    . sodium chloride    . lactated ringers     PRN Meds:.sodium chloride, dimenhyDRINATE, metoprolol tartrate, ondansetron (ZOFRAN) IV, oxyCODONE, sodium chloride flush, sodium chloride flush, sodium chloride flush, traMADol  General appearance: alert, cooperative and no distress Heart: regular rate and rhythm Lungs: clear to auscultation bilaterally Abdomen: soft, non-tender; bowel sounds normal; no masses,  no organomegaly Extremities: edema trace Wound: clean and dry  Lab Results: CBC: Recent Labs    10/10/18 0848 10/11/18 0512  WBC 9.1 7.9  HGB 8.8* 9.1*  HCT 27.4* 27.6*  PLT 131* 156   BMET:  Recent Labs    10/10/18 0848 10/11/18 0512  NA 136 136  K 3.1* 3.3*  CL 101 101  CO2 26 26  GLUCOSE 153* 104*  BUN 17 18  CREATININE 1.11 1.08  CALCIUM 7.7* 7.9*    CMET: Lab Results  Component Value Date   WBC 7.9 10/11/2018   HGB 9.1 (L) 10/11/2018  HCT 27.6 (L) 10/11/2018   PLT 156 10/11/2018   GLUCOSE 104 (H) 10/11/2018   CHOL 140 10/02/2018   TRIG 74 10/02/2018   HDL 34 (L) 10/02/2018   LDLCALC 91 10/02/2018   ALT 19 10/04/2018   AST 20 10/04/2018   NA 136 10/11/2018   K 3.3 (L) 10/11/2018   CL 101 10/11/2018   CREATININE 1.08 10/11/2018   BUN 18 10/11/2018   CO2 26 10/11/2018   TSH 7.170 (H) 10/04/2018   INR 1.40 10/06/2018   HGBA1C 5.8 (H) 10/02/2018      PT/INR: No results for input(s): LABPROT, INR in the last 72 hours. Radiology: Dg Chest 2 View  Result Date: 10/11/2018 CLINICAL DATA:  Atelectasis. EXAM: CHEST - 2 VIEW COMPARISON:  10/09/2018 FINDINGS: Right-sided PICC line has tip over the SVC. Sternotomy wires unchanged. Lungs are adequately inflated with minimal opacification over the posterior lung base  likely a small amount of bilateral pleural fluid left worse than right with associated atelectasis. Borderline cardiomegaly. Remainder of the exam is unchanged. IMPRESSION: Minimal posterior bibasilar opacification likely small effusions with associated atelectasis left worse than right. Right-sided PICC line with tip over the SVC. Electronically Signed   By: Marin Olp M.D.   On: 10/11/2018 14:05     Assessment/Plan: S/P Procedure(s) (LRB): CORONARY ARTERY BYPASS GRAFTING (CABG) x four, using left internal mammary artery and right leg greater saphenous vein harvested endoscopically (N/A) TRANSESOPHAGEAL ECHOCARDIOGRAM (TEE) (N/A)  1. CV- PAF, maintaining Sinus Bradycardia- will stop IV Amiodarone, will start oral Amiodarone 200 mg BID starting tomorrow, on Lopressor 12.5 mg BID as HR 2. Pulm- no acute issues, continue IS, CXR with basilar atelectasis, minimal effusions 3. Renal- creatinine at 1.08, K is a 3.3 supplemented per protocol, oral supplement as well, will recheck BMET in AM, continue Lasix at 40 mg daily 4. Expected post operative blood loss anemia,  Mild at 9.1 monitor 5. CBGs controlled, not a diabetic, will d/c cbgs and SSIP 6. Dispo- patient stable, stop amiodarone drip, transition to oral, continue lopressor if HR will allow, recheck BMET in AM,    Erin Barrett 10/11/2018 3:46 PM  I have seen and examined the patient and agree with the assessment and plan as outlined.  Maintaining NSR last 24 hours.  [name], MD 10/11/2018 6:33 PM

## 2018-10-12 LAB — CBC WITH DIFFERENTIAL/PLATELET
ABS IMMATURE GRANULOCYTES: 0.07 10*3/uL (ref 0.00–0.07)
BASOS ABS: 0 10*3/uL (ref 0.0–0.1)
Basophils Relative: 0 %
EOS ABS: 0.3 10*3/uL (ref 0.0–0.5)
Eosinophils Relative: 4 %
HEMATOCRIT: 29.5 % — AB (ref 39.0–52.0)
HEMOGLOBIN: 9.4 g/dL — AB (ref 13.0–17.0)
IMMATURE GRANULOCYTES: 1 %
LYMPHS ABS: 1.1 10*3/uL (ref 0.7–4.0)
Lymphocytes Relative: 14 %
MCH: 28.8 pg (ref 26.0–34.0)
MCHC: 31.9 g/dL (ref 30.0–36.0)
MCV: 90.5 fL (ref 80.0–100.0)
Monocytes Absolute: 0.5 10*3/uL (ref 0.1–1.0)
Monocytes Relative: 7 %
NEUTROS PCT: 74 %
Neutro Abs: 5.9 10*3/uL (ref 1.7–7.7)
Platelets: 191 10*3/uL (ref 150–400)
RBC: 3.26 MIL/uL — AB (ref 4.22–5.81)
RDW: 13.8 % (ref 11.5–15.5)
WBC: 8 10*3/uL (ref 4.0–10.5)
nRBC: 0.3 % — ABNORMAL HIGH (ref 0.0–0.2)

## 2018-10-12 LAB — BASIC METABOLIC PANEL
ANION GAP: 8 (ref 5–15)
BUN: 15 mg/dL (ref 8–23)
CHLORIDE: 105 mmol/L (ref 98–111)
CO2: 24 mmol/L (ref 22–32)
Calcium: 8.2 mg/dL — ABNORMAL LOW (ref 8.9–10.3)
Creatinine, Ser: 1.15 mg/dL (ref 0.61–1.24)
GFR calc Af Amer: >60 mL/min (ref >60)
GLUCOSE: 98 mg/dL (ref 70–99)
POTASSIUM: 3.7 mmol/L (ref 3.5–5.1)
Sodium: 137 mmol/L (ref 135–145)

## 2018-10-12 LAB — GLUCOSE, CAPILLARY: Glucose-Capillary: 108 mg/dL — ABNORMAL HIGH (ref 70–99)

## 2018-10-12 NOTE — Progress Notes (Signed)
Patient ID: David [name], male   DOB: [birth year], 67 y.o.   MRN: [id number]    PCP-Cardiologist: Sanda Klein, MD   Subjective:    Sitting in chair no complaints   Objective:   Weight Range: 100.3 kg Body mass index is 31.72 kg/m.   Vital Signs:   Temp:  [97.7 F (36.5 C)-98.7 F (37.1 C)] 98.3 F (36.8 C) (10/21 0734) Pulse Rate:  [58] 58 (10/20 2000) Resp:  [12-24] 19 (10/21 0734) BP: (105-147)/(68-83) 140/83 (10/21 0734) SpO2:  [90 %-96 %] 96 % (10/21 0734) Weight:  [100.3 kg] 100.3 kg (10/21 0500) Last BM Date: 10/11/18  Weight change: Filed Weights   10/10/18 1200 10/11/18 0500 10/12/18 0500  Weight: 101.7 kg 101.5 kg 100.3 kg    Intake/Output:   Intake/Output Summary (Last 24 hours) at 10/12/2018 0811 Last data filed at 10/12/2018 0600 Gross per 24 hour  Intake 835.99 ml  Output 900 ml  Net -64.01 ml      Physical Exam    BP 140/83 (BP Location: Left Arm)   Pulse (!) 58   Temp 98.3 F (36.8 C) (Oral)   Resp 19   Ht 5\' 10"  (1.778 m)   Wt 100.3 kg   SpO2 96%   BMI 31.72 kg/m  Affect appropriate Healthy:  appears stated age HEENT: normal Neck supple with no adenopathy JVP normal no bruits no thyromegaly Lungs clear with no wheezing and good diaphragmatic motion Heart:  S1/S2 no murmur, no rub, gallop or click PMI normal post sternotomy  Abdomen: benighn, BS positve, no tenderness, no AAA no bruit.  No HSM or HJR Distal pulses intact with no bruits No edema Neuro non-focal Skin warm and dry No muscular weakness    Telemetry   NSR in 60s (personally reviewed)  Labs    CBC Recent Labs    10/11/18 0512 10/12/18 0447  WBC 7.9 8.0  NEUTROABS 5.7 5.9  HGB 9.1* 9.4*  HCT 27.6* 29.5*  MCV 90.2 90.5  PLT 156 712   Basic Metabolic Panel Recent Labs    10/11/18 0512 10/12/18 0447  NA 136 137  K 3.3* 3.7  CL 101 105  CO2 26 24  GLUCOSE 104* 98  BUN 18 15  CREATININE 1.08 1.15  CALCIUM 7.9* 8.2*     Imaging    No  results found.   Medications:     Scheduled Medications: . amiodarone  200 mg Oral BID  . aspirin EC  325 mg Oral Daily  . atorvastatin  80 mg Oral q1800  . bisacodyl  10 mg Oral Daily   Or  . bisacodyl  10 mg Rectal Daily  . chlorhexidine      . Chlorhexidine Gluconate Cloth  6 each Topical Daily  . docusate sodium  200 mg Oral Daily  . enoxaparin (LOVENOX) injection  40 mg Subcutaneous Q24H  . furosemide  40 mg Oral Daily  . metoprolol tartrate  12.5 mg Oral BID  . pantoprazole  40 mg Oral Daily  . potassium chloride  40 mEq Oral Daily  . sodium chloride flush  10-40 mL Intracatheter Q12H  . sodium chloride flush  3 mL Intravenous Q12H  . sodium chloride flush  3 mL Intravenous Q12H  . tamsulosin  0.4 mg Oral QPC supper    Infusions: . sodium chloride    . sodium chloride    . lactated ringers      PRN Medications: sodium chloride, dimenhyDRINATE, metoprolol tartrate, ondansetron (ZOFRAN) IV, oxyCODONE,  sodium chloride flush, sodium chloride flush, sodium chloride flush, traMADol    Patient Profile   67 yo with NSTEMI now s/p CABG x 4.   Assessment/Plan   1. CAD: NSTEMI at admission, now s/p CABG x 4.  Progressing nicely post-op.  - Continue statin.  - Continue ASA.  2. Atrial fibrillation: Post-op afib, converted to NSR continue oral amiodarone for about 4 weeks no anticoagulation at this time 3. Acute diastolic CHF: Echo with EF 55-60%. Improved with diuretic   Jenkins Rouge

## 2018-10-12 NOTE — Progress Notes (Signed)
6 Days Post-Op Procedure(s) (LRB): CORONARY ARTERY BYPASS GRAFTING (CABG) x four, using left internal mammary artery and right leg greater saphenous vein harvested endoscopically (N/A) TRANSESOPHAGEAL ECHOCARDIOGRAM (TEE) (N/A) Subjective: Overall significant improvement Patient ambulating now without dizziness Patient remained in sinus rhythm over the weekend Chest x-ray is clear with diuresis Patient ready for transfer to stepdown on 4 E. We will remove epicardial wires in a.m. October 22 and discharge later that afternoon  Objective: Vital signs in last 24 hours: Temp:  [97.7 F (36.5 C)-98.7 F (37.1 C)] 98 F (36.7 C) (10/21 0846) Pulse Rate:  [58-72] 72 (10/21 0846) Cardiac Rhythm: Normal sinus rhythm (10/21 0845) Resp:  [12-24] 15 (10/21 0846) BP: (105-163)/(68-86) 163/86 (10/21 0846) SpO2:  [90 %-96 %] 96 % (10/21 0846) Weight:  [100.3 kg] 100.3 kg (10/21 0500)  Hemodynamic parameters for last 24 hours:  Sinus rhythm afebrile  Intake/Output from previous day: 10/20 0701 - 10/21 0700 In: 898 [P.O.:720; I.V.:132.7; IV Piggyback:45.3] Out: 900 [Urine:900] Intake/Output this shift: No intake/output data recorded.       Exam    General- alert and comfortable    Neck- no JVD, no cervical adenopathy palpable, no carotid bruit   Lungs- clear without rales, wheezes   Cor- regular rate and rhythm, no murmur , gallop   Abdomen- soft, non-tender   Extremities - warm, non-tender, minimal edema   Neuro- oriented, appropriate, no focal weakness   Lab Results: Recent Labs    10/11/18 0512 10/12/18 0447  WBC 7.9 8.0  HGB 9.1* 9.4*  HCT 27.6* 29.5*  PLT 156 191   BMET:  Recent Labs    10/11/18 0512 10/12/18 0447  NA 136 137  K 3.3* 3.7  CL 101 105  CO2 26 24  GLUCOSE 104* 98  BUN 18 15  CREATININE 1.08 1.15  CALCIUM 7.9* 8.2*    PT/INR: No results for input(s): LABPROT, INR in the last 72 hours. ABG    Component Value Date/Time   PHART 7.369 10/06/2018  1844   HCO3 21.0 10/06/2018 1844   TCO2 25 10/08/2018 1545   ACIDBASEDEF 4.0 (H) 10/06/2018 1844   O2SAT 94.0 10/06/2018 1844   CBG (last 3)  Recent Labs    10/10/18 2136 10/11/18 0647 10/11/18 1128  GLUCAP 97 109* 102*    Assessment/Plan: S/P Procedure(s) (LRB): CORONARY ARTERY BYPASS GRAFTING (CABG) x four, using left internal mammary artery and right leg greater saphenous vein harvested endoscopically (N/A) TRANSESOPHAGEAL ECHOCARDIOGRAM (TEE) (N/A) Mobilize Diuresis Plan for transfer to step-down: see transfer orders   LOS: 9 days    David [name] 10/12/2018

## 2018-10-12 NOTE — Care Management Important Message (Signed)
Important Message  Patient Details  Name: David [name] MRN: [id number] Date of Birth: Jan 31, 1951   Medicare Important Message Given:  Yes    Clarann Helvey P Vanderbilt 10/12/2018, 1:27 PM

## 2018-10-12 NOTE — Plan of Care (Signed)
  Problem: Education: Goal: Knowledge of General Education information will improve Description Including pain rating scale, medication(s)/side effects and non-pharmacologic comfort measures Outcome: Progressing   Problem: Health Behavior/Discharge Planning: Goal: Ability to manage health-related needs will improve Outcome: Progressing   Problem: Clinical Measurements: Goal: Diagnostic test results will improve Outcome: Progressing Goal: Respiratory complications will improve Outcome: Progressing Goal: Cardiovascular complication will be avoided Outcome: Progressing   Problem: Elimination: Goal: Will not experience complications related to bowel motility Outcome: Completed/Met

## 2018-10-12 NOTE — Progress Notes (Signed)
CARDIAC REHAB PHASE I   PRE:  Rate/Rhythm: 6 SR  BP:  Supine:   Sitting: 146/97  Standing:    SaO2: 92%RA  MODE:  Ambulation: 790 ft   POST:  Rate/Rhythm: 80 SR  BP:  Supine: 149/77  Sitting:   Standing:    SaO2: 94%RA 1345-1410 Pt walked 790 ft on RA with hand held asst. Gait steady. Tolerated well. Back to bed after walk. Reinforced sternal precautions.    Graylon Good, RN BSN  10/12/2018 2:06 PM

## 2018-10-13 LAB — CBC WITH DIFFERENTIAL/PLATELET
ABS IMMATURE GRANULOCYTES: 0.07 10*3/uL (ref 0.00–0.07)
Basophils Absolute: 0 10*3/uL (ref 0.0–0.1)
Basophils Relative: 0 %
EOS PCT: 4 %
Eosinophils Absolute: 0.3 10*3/uL (ref 0.0–0.5)
HEMATOCRIT: 29.4 % — AB (ref 39.0–52.0)
HEMOGLOBIN: 9.5 g/dL — AB (ref 13.0–17.0)
Immature Granulocytes: 1 %
LYMPHS ABS: 1.1 10*3/uL (ref 0.7–4.0)
Lymphocytes Relative: 13 %
MCH: 28.8 pg (ref 26.0–34.0)
MCHC: 32.3 g/dL (ref 30.0–36.0)
MCV: 89.1 fL (ref 80.0–100.0)
MONO ABS: 0.6 10*3/uL (ref 0.1–1.0)
MONOS PCT: 7 %
NEUTROS ABS: 6.3 10*3/uL (ref 1.7–7.7)
Neutrophils Relative %: 75 %
Platelets: 225 10*3/uL (ref 150–400)
RBC: 3.3 MIL/uL — ABNORMAL LOW (ref 4.22–5.81)
RDW: 13.7 % (ref 11.5–15.5)
WBC: 8.4 10*3/uL (ref 4.0–10.5)
nRBC: 0 % (ref 0.0–0.2)

## 2018-10-13 LAB — BASIC METABOLIC PANEL
Anion gap: 10 (ref 5–15)
BUN: 14 mg/dL (ref 8–23)
CHLORIDE: 102 mmol/L (ref 98–111)
CO2: 23 mmol/L (ref 22–32)
CREATININE: 1.32 mg/dL — AB (ref 0.61–1.24)
Calcium: 8.5 mg/dL — ABNORMAL LOW (ref 8.9–10.3)
GFR calc Af Amer: >60 mL/min (ref >60)
GFR calc non Af Amer: 54 mL/min — ABNORMAL LOW (ref >60)
Glucose, Bld: 99 mg/dL (ref 70–99)
POTASSIUM: 3.9 mmol/L (ref 3.5–5.1)
Sodium: 135 mmol/L (ref 135–145)

## 2018-10-13 MED ORDER — ASPIRIN 325 MG PO TBEC: DELAYED_RELEASE_TABLET | ORAL | Status: DC

## 2018-10-13 MED ORDER — ATORVASTATIN CALCIUM 80 MG PO TABS: 80.0000 mg | ORAL_TABLET | Freq: Every day | ORAL | 1 refills | Status: DC

## 2018-10-13 MED ORDER — METOPROLOL TARTRATE 25 MG PO TABS: 12.5000 mg | ORAL_TABLET | Freq: Two times a day (BID) | ORAL | 1 refills | Status: DC

## 2018-10-13 MED ORDER — AMIODARONE HCL 200 MG PO TABS: 200.0000 mg | ORAL_TABLET | Freq: Two times a day (BID) | ORAL | 1 refills | Status: DC

## 2018-10-13 MED ORDER — AMLODIPINE BESYLATE 5 MG PO TABS
5.0000 mg | ORAL_TABLET | Freq: Every day | ORAL | Status: DC
  Administered 2018-10-13: 5 mg via ORAL
  Filled 2018-10-13: qty 1

## 2018-10-13 MED ORDER — OXYCODONE HCL 5 MG PO TABS: 5.0000 mg | ORAL_TABLET | Freq: Four times a day (QID) | ORAL | 0 refills | Status: AC | PRN

## 2018-10-13 NOTE — Care Management Note (Signed)
Case Management Note Marvetta Gibbons RN, BSN Transitions of Care Unit 4E- RN Case Manager [phone]  Patient Details  Name: David [name] MRN: [id number] Date of Birth: 09/09/1951  Subjective/Objective:   Pt admitted with NSTEMI, s/p CABG                 Action/Plan: PTA pt lived at home with wife, plan to return home wife- no CM needs noted for transition home. Pt is not going home on Eliquis.   Expected Discharge Date:  10/13/18               Expected Discharge Plan:  Home/Self Care  In-House Referral:  NA  Discharge planning Services  CM Consult  Post Acute Care Choice:  NA Choice offered to:  NA  DME Arranged:    DME Agency:     HH Arranged:    HH Agency:     Status of Service:  Completed, signed off  If discussed at [place] of Stay Meetings, dates discussed:    Discharge Disposition: home/self care   Additional Comments:  Dawayne Patricia, RN 10/13/2018, 11:54 AM

## 2018-10-13 NOTE — Progress Notes (Signed)
1000-1030 Education completed with pt and wife who voiced understanding. Reviewed staying in the tube, ex ed, heart healthy diet given and encouraged IS. Discussed CRP 2 and referred to [facility] program. Pt and wife have viewed discharge video. Graylon Good RN BSN 10/13/2018 10:30 AM

## 2018-10-13 NOTE — Progress Notes (Addendum)
[address]       [place] [address]             [phone]      7 Days Post-Op Procedure(s) (LRB): CORONARY ARTERY BYPASS GRAFTING (CABG) x four, using left internal mammary artery and right leg greater saphenous vein harvested endoscopically (N/A) TRANSESOPHAGEAL ECHOCARDIOGRAM (TEE) (N/A) Subjective: Feels well, no new issues or C/O  Objective: Vital signs in last 24 hours: Temp:  [98 F (36.7 C)-98.5 F (36.9 C)] 98.3 F (36.8 C) (10/22 0414) Pulse Rate:  [64-72] 66 (10/22 0414) Cardiac Rhythm: Normal sinus rhythm (10/21 2100) Resp:  [15-25] 21 (10/22 0414) BP: (127-163)/(80-86) 137/80 (10/22 0414) SpO2:  [93 %-96 %] 96 % (10/22 0414) Weight:  [98.3 kg] 98.3 kg (10/22 0411)  Hemodynamic parameters for last 24 hours:    Intake/Output from previous day: 10/21 0701 - 10/22 0700 In: 15 [I.V.:15] Out: 1625 [Urine:1625] Intake/Output this shift: No intake/output data recorded.  General appearance: alert, cooperative and no distress Heart: regular rate and rhythm Lungs: clear to auscultation bilaterally Abdomen: benign Extremities: no edema Wound: incis healing well  Lab Results: Recent Labs    10/12/18 0447 10/13/18 0419  WBC 8.0 8.4  HGB 9.4* 9.5*  HCT 29.5* 29.4*  PLT 191 225   BMET:  Recent Labs    10/12/18 0447 10/13/18 0419  NA 137 135  K 3.7 3.9  CL 105 102  CO2 24 23  GLUCOSE 98 99  BUN 15 14  CREATININE 1.15 1.32*  CALCIUM 8.2* 8.5*    PT/INR: No results for input(s): LABPROT, INR in the last 72 hours. ABG    Component Value Date/Time   PHART 7.369 10/06/2018 1844   HCO3 21.0 10/06/2018 1844   TCO2 25 10/08/2018 1545   ACIDBASEDEF 4.0 (H) 10/06/2018 1844   O2SAT 94.0 10/06/2018 1844   CBG (last 3)  Recent Labs    10/10/18 2136 10/11/18 0647 10/11/18 1128  GLUCAP 97 109* 102*    Meds Scheduled Meds: . amiodarone  200 mg Oral BID  . aspirin EC  325 mg Oral Daily  . atorvastatin  80 mg Oral q1800    . bisacodyl  10 mg Oral Daily   Or  . bisacodyl  10 mg Rectal Daily  . Chlorhexidine Gluconate Cloth  6 each Topical Daily  . docusate sodium  200 mg Oral Daily  . enoxaparin (LOVENOX) injection  40 mg Subcutaneous Q24H  . furosemide  40 mg Oral Daily  . metoprolol tartrate  12.5 mg Oral BID  . pantoprazole  40 mg Oral Daily  . potassium chloride  40 mEq Oral Daily  . sodium chloride flush  10-40 mL Intracatheter Q12H  . sodium chloride flush  3 mL Intravenous Q12H  . sodium chloride flush  3 mL Intravenous Q12H  . tamsulosin  0.4 mg Oral QPC supper   Continuous Infusions: . sodium chloride    . sodium chloride    . lactated ringers     PRN Meds:.sodium chloride, dimenhyDRINATE, metoprolol tartrate, ondansetron (ZOFRAN) IV, oxyCODONE, sodium chloride flush, sodium chloride flush, sodium chloride flush, traMADol  Xrays No results found.  Assessment/Plan: S/P Procedure(s) (LRB): CORONARY ARTERY BYPASS GRAFTING (CABG) x four, using left internal mammary artery and right leg greater saphenous vein harvested endoscopically (N/A) TRANSESOPHAGEAL ECHOCARDIOGRAM (TEE) (N/A)  1 doing well 2 hemodyn stable in sinus rhythm some HTN, will restart home norvasc, Creat rising so will not start ACE-I/ARB at  this time. Appears pretty euvolemic- will d/c lasix 3 wires out this am 4 anemia is stable, no leukocytosis  5 routine rehab/pulm toilet 6 home this afternoon if no changes    LOS: 10 days    [facility] 10/13/2018 Pager [phone]  Patient continues to do well Agree with plan for DC later today DC instructions reviewed with patient  patient examined and medical record reviewed,agree with above note. Tharon Aquas Trigt III 10/13/2018

## 2018-10-13 NOTE — Progress Notes (Signed)
Dr. Prescott Gum removed epicardial wires and right upper arm picc line. Patient instructed to remain in bed for one hour and frequent vital signs taken and documented. Pt aware he will discharge later this afternoon. (15:30 per Dr. Prescott Gum). Pt resting with call bell within reach.  Will continue to monitor. Payton Emerald, RN

## 2018-10-16 DIAGNOSIS — I10 Essential (primary) hypertension: Secondary | ICD-10-CM | POA: Insufficient documentation

## 2018-10-16 DIAGNOSIS — E785 Hyperlipidemia, unspecified: Secondary | ICD-10-CM | POA: Insufficient documentation

## 2018-10-16 DIAGNOSIS — I214 Non-ST elevation (NSTEMI) myocardial infarction: Secondary | ICD-10-CM | POA: Insufficient documentation

## 2018-10-19 ENCOUNTER — Telehealth (HOSPITAL_COMMUNITY): Payer: Self-pay

## 2018-10-19 NOTE — Telephone Encounter (Signed)
Pt's insurance is active and benefits verified through [facility] - $20.00 co-pay, no deductible, out of pocket amount of $4,500/$288.53 has been met, no co-insurance, and no pre-authorization is required. Passport/reference [id number]  Will make initial to pt in regards to CR. If interested, pt will need to complete f/u appt. Once completed, pt will be contacted for scheduling.

## 2018-10-19 NOTE — Telephone Encounter (Signed)
Attempted to make initial call to pt in regards to CR - lm on vm °

## 2018-10-20 ENCOUNTER — Telehealth (HOSPITAL_COMMUNITY): Payer: Self-pay

## 2018-10-20 NOTE — Telephone Encounter (Signed)
Patient wife David [name] returned Cardiac Rehab phone call and stated is interested in the Cardiac Rehab Program. Explained scheduling process and went over insurance, patient verbalized understanding. Will contact patient for scheduling once f/u has been completed.

## 2018-10-26 ENCOUNTER — Ambulatory Visit: Payer: Medicare Other | Admitting: Physician Assistant

## 2018-10-26 ENCOUNTER — Encounter: Payer: Self-pay | Admitting: Physician Assistant

## 2018-10-26 VITALS — BP 142/78 | HR 61 | Ht 70.0 in | Wt 220.6 lb

## 2018-10-26 DIAGNOSIS — I480 Paroxysmal atrial fibrillation: Secondary | ICD-10-CM

## 2018-10-26 DIAGNOSIS — D649 Anemia, unspecified: Secondary | ICD-10-CM | POA: Diagnosis not present

## 2018-10-26 DIAGNOSIS — I10 Essential (primary) hypertension: Secondary | ICD-10-CM

## 2018-10-26 DIAGNOSIS — I25810 Atherosclerosis of coronary artery bypass graft(s) without angina pectoris: Secondary | ICD-10-CM

## 2018-10-26 DIAGNOSIS — E785 Hyperlipidemia, unspecified: Secondary | ICD-10-CM | POA: Diagnosis not present

## 2018-10-26 DIAGNOSIS — N179 Acute kidney failure, unspecified: Secondary | ICD-10-CM

## 2018-10-26 MED ORDER — METOPROLOL TARTRATE 25 MG PO TABS: 25.0000 mg | ORAL_TABLET | Freq: Two times a day (BID) | ORAL | 1 refills | Status: DC

## 2018-10-26 NOTE — Patient Instructions (Signed)
Medication Instructions:  Increase Metoprolol to 25 mg twice daily. If you need a refill on your cardiac medications before your next appointment, please call your pharmacy.   Lab work: BMET, CBC today. Fasting Lipid and Hepatic labs in 2 months. If you have labs (blood work) drawn today and your tests are completely normal, you will receive your results only by: Marland Kitchen MyChart Message (if you have MyChart) OR . A paper copy in the mail If you have any lab test that is abnormal or we need to change your treatment, we will call you to review the results.  Testing/Procedures: None Ordered.  Follow-Up: At [facility], you and your health needs are our priority.  As part of our continuing mission to provide you with exceptional heart care, we have created designated Provider Care Teams.  These Care Teams include your primary Cardiologist (physician) and Advanced Practice Providers (APPs -  Physician Assistants and Nurse Practitioners) who all work together to provide you with the care you need, when you need it. You will need a follow up appointment in 2-3 months.  Please call our office 2 months in advance to schedule this appointment.  You may see Sanda Klein, MD or one of the following Advanced Practice Providers on your designated Care Team: [place], [place] . Fabian Sharp, PA-C  Any Other Special Instructions Will Be Listed Below (If Applicable). None

## 2018-10-26 NOTE — Progress Notes (Signed)
Cardiology Office Note    Date:  10/28/2018   ID:  David [name], DOB [birth year], MRN [id number]  PCP:  Patient, No Pcp Per  Cardiologist: Dr. Sallyanne Kuster  Chief Complaint  Patient presents with  . Hospitalization Follow-up    seen for Dr. Sallyanne Kuster. Post CABG    History of Present Illness:  David [name] is a 67 y.o. male with past medical history of hypertension and hyperlipidemia who recently presented to the hospital with chest pain.  Troponin was positive.  Creatinine on arrival was 1.38.  Patient underwent cardiac catheterization on 10/02/2018 which showed severe two-vessel disease with 99% mid LAD, 90% second diagonal, totally occluded mid RCA, normal left circumflex artery, EF 50%.  CT surgery was consulted for multivessel disease.  Echocardiogram obtained on 10/02/2018 showed EF 55 to 60%, mild hypokinesis in the basal mid inferior and inferoseptal myocardium, grade 1 DD.  Patient eventually underwent CABG x4 with LIMA to LAD, SVG to diagonal, sequential SVG to PDA and PLB by Dr. Montez Hageman.  Postop course was complicated by occurrence of atrial fibrillation for which he was placed on amiodarone and diltiazem.  He was not placed on systemic anticoagulation.  Patient presents today for cardiology office visit.  He does have some soreness in the chest especially with cough.  He also complaining of tingling sensation in the left hand, I do not have any explanation for the tingling sensation.  His radial pulse seems to be very good.  His blood pressure is borderline elevated, I will increase metoprolol to 25 mg twice daily.  I will obtain a CBC to follow-up on the anemia and basic metabolic panel to follow-up on the acute kidney injury.  He has been noticing some worsening right lower extremity edema with standing, I think it is related to the right lower extremity vein harvesting from the recent surgery.  Once he finished the current course of amiodarone, he can stop the amiodarone.  Recent  atrial fibrillation was postop A. fib, therefore he was not placed on systemic anticoagulation therapy.  Otherwise, his LDL goal is less than 70, he will need fasting lipid panel and LFT in 2 months.   Past Medical History:  Diagnosis Date  . Dyslipidemia   . Essential hypertension   . S/P CABG x 4     Past Surgical History:  Procedure Laterality Date  . CORONARY ARTERY BYPASS GRAFT N/A 10/06/2018   Procedure: CORONARY ARTERY BYPASS GRAFTING (CABG) x four, using left internal mammary artery and right leg greater saphenous vein harvested endoscopically;  Surgeon: Ivin Poot, MD;  Location: [facility];  Service: Open Heart Surgery;  Laterality: N/A;  . LEFT HEART CATH AND CORONARY ANGIOGRAPHY N/A 10/02/2018   Procedure: LEFT HEART CATH AND CORONARY ANGIOGRAPHY;  Surgeon: Belva Crome, MD;  Location: [place] [facility];  Service: Cardiovascular;  Laterality: N/A;  . TEE WITHOUT CARDIOVERSION N/A 10/06/2018   Procedure: TRANSESOPHAGEAL ECHOCARDIOGRAM (TEE);  Surgeon: Prescott Gum, Collier Salina, MD;  Location: [facility];  Service: Open Heart Surgery;  Laterality: N/A;    Current Medications: Outpatient Medications Prior to Visit  Medication Sig Dispense Refill  . amiodarone (PACERONE) 200 MG tablet Take 1 tablet (200 mg total) by mouth 2 (two) times daily. 60 tablet 1  . amLODipine (NORVASC) 5 MG tablet Take 5 mg by mouth daily.     Marland Kitchen aspirin EC 325 MG EC tablet Take 1 tablet (325 mg total) by mouth daily, THEN 1 tablet (325 mg total) daily.    Marland Kitchen  atorvastatin (LIPITOR) 80 MG tablet Take 1 tablet (80 mg total) by mouth daily at 6 PM. 30 tablet 1  . tamsulosin (FLOMAX) 0.4 MG CAPS capsule Take 0.4 mg by mouth daily.  0  . metoprolol tartrate (LOPRESSOR) 25 MG tablet Take 0.5 tablets (12.5 mg total) by mouth 2 (two) times daily. 30 tablet 1   No facility-administered medications prior to visit.      Allergies:   Patient has no known allergies.   Social History   Socioeconomic History  . Marital  status: Married    Spouse name: Not on file  . Number of children: Not on file  . Years of education: Not on file  . Highest education level: Not on file  Occupational History  . Not on file  Social Needs  . Financial resource strain: Not on file  . Food insecurity:    Worry: Not on file    Inability: Not on file  . Transportation needs:    Medical: Not on file    Non-medical: Not on file  Tobacco Use  . Smoking status: Never Smoker  . Smokeless tobacco: Never Used  Substance and Sexual Activity  . Alcohol use: Not on file  . Drug use: Not on file  . Sexual activity: Not on file  Lifestyle  . Physical activity:    Days per week: Not on file    Minutes per session: Not on file  . Stress: Not on file  Relationships  . Social connections:    Talks on phone: Not on file    Gets together: Not on file    Attends religious service: Not on file    Active member of club or organization: Not on file    Attends meetings of clubs or organizations: Not on file    Relationship status: Not on file  Other Topics Concern  . Not on file  Social History Narrative  . Not on file     Family History:  The patient's family history includes Heart attack (age of onset: 68) in his brother; Heart attack (age of onset: 58) in his mother.   ROS:   Please see the history of present illness.    ROS All other systems reviewed and are negative.   PHYSICAL EXAM:   VS:  BP (!) 142/78   Pulse 61   Ht 5\' 10"  (1.778 m)   Wt 220 lb 9.6 oz (100.1 kg)   BMI 31.65 kg/m    GEN: Well nourished, well developed, in no acute distress  HEENT: normal  Neck: no JVD, carotid bruits, or masses Cardiac: RRR; no murmurs, rubs, or gallops,no edema  Respiratory:  clear to auscultation bilaterally, normal work of breathing GI: soft, nontender, nondistended, + BS MS: no deformity or atrophy  Skin: warm and dry, no rash Neuro:  Alert and Oriented x 3, Strength and sensation are intact Psych: euthymic mood, full  affect  Wt Readings from Last 3 Encounters:  10/26/18 220 lb 9.6 oz (100.1 kg)  10/13/18 216 lb 12.8 oz (98.3 kg)      Studies/Labs Reviewed:   EKG:  EKG is ordered today.  The ekg ordered today demonstrates normal sinus rhythm with T wave inversion in anterior leads.  Recent Labs: 10/04/2018: ALT 19; TSH 7.170 10/07/2018: Magnesium 2.4 10/26/2018: BUN 16; Creatinine, Ser 1.21; Hemoglobin 11.2; Platelets 297; Potassium 5.0; Sodium 144   Lipid Panel    Component Value Date/Time   CHOL 140 10/02/2018 0830   TRIG 74  10/02/2018 0830   HDL 34 (L) 10/02/2018 0830   CHOLHDL 4.1 10/02/2018 0830   VLDL 15 10/02/2018 0830   LDLCALC 91 10/02/2018 0830    Additional studies/ records that were reviewed today include:   Cath 10/02/2018  Severe two-vessel coronary disease with mid LAD 99% stenosis within a calcified segment.  The second diagonal contains 90% stenosis.  The diagonal is large.  Circumflex artery is normal  The RCA is large giving origin to PDA and to left ventricular branches.  It is totally occluded in the mid vessel and also distally beyond the PDA.  Both the PDA and left ventricular branches fill by right to right and left to right collaterals.    The circumflex coronary artery free of obstructive disease.  Left ventricular function overall appears normal.  EF is at least 50%.  LVEDP is normal.  RECOMMENDATIONS:   Unlikely that RCA can be completely revascularized with CTO technique given the distal total occlusion beyond the PDA.  Therefore with severe LAD disease and significant diagonal obstruction, coronary surgical revascularization has been recommended.  Would anticipate LIMA to LAD, SVG to the diagonal branch, and SVG to PDA and LV branch.  An alternative strategy if needed could be culprit LAD and diagonal PCI.  Medical therapy for the chronically occluded RCA.   Echo 10/02/2018 LV EF: 55% -   60% Study Conclusions  - Left ventricle: The cavity size  was normal. Systolic function was   normal. The estimated ejection fraction was in the range of 55%   to 60%. Mild hypokinesis of the basal-midinferior and   inferoseptal myocardium; consistent with ischemia or infarction   in the distribution of the right coronary artery. Doppler   parameters are consistent with abnormal left ventricular   relaxation (grade 1 diastolic dysfunction). - Left atrium: The atrium was mildly dilated.   TEE 10/06/2018 Result status: Final result   Septum: No Patent Foramen Ovale present.  Left atrium: Patent foramen ovale not present.  Right ventricle: Normal cavity size, wall thickness and ejection fraction. No thrombus present. No mass present.  Normal echocardiogram.      ASSESSMENT:    1. Coronary artery disease involving coronary bypass graft of native heart without angina pectoris   2. Anemia, unspecified type   3. AKI (acute kidney injury) ([place])   4. Hyperlipidemia, unspecified hyperlipidemia type   5. Essential hypertension   6. PAF (paroxysmal atrial fibrillation) (HCC)      PLAN:  In order of problems listed above:  1. CAD s/p CABG: Currently on high-dose aspirin after bypass surgery.  Continue on high-dose statin.  He denies any recent chest pain.  His sternal wound well-healed  2. Hypertension: Blood pressure well controlled on current therapy  3. Hyperlipidemia: Continue Lipitor 80 mg daily.  Fasting lipid panel and LFT in 2 months.  4. AKI: Obtain basic metabolic panel today to follow-up on renal function  5. Anemia: Postop anemia, will obtain CBC today  6. Postop atrial fibrillation: Finish the current course of amiodarone then stop.  No need for systemic anticoagulation unless any recurrence.   Medication Adjustments/Labs and Tests Ordered: Current medicines are reviewed at length with the patient today.  Concerns regarding medicines are outlined above.  Medication changes, Labs and Tests ordered today are listed in the  Patient Instructions below. Patient Instructions  Medication Instructions:  Increase Metoprolol to 25 mg twice daily. If you need a refill on your cardiac medications before your next appointment, please call  your pharmacy.   Lab work: BMET, CBC today. Fasting Lipid and Hepatic labs in 2 months. If you have labs (blood work) drawn today and your tests are completely normal, you will receive your results only by: Marland Kitchen MyChart Message (if you have MyChart) OR . A paper copy in the mail If you have any lab test that is abnormal or we need to change your treatment, we will call you to review the results.  Testing/Procedures: None Ordered.  Follow-Up: At [facility], you and your health needs are our priority.  As part of our continuing mission to provide you with exceptional heart care, we have created designated Provider Care Teams.  These Care Teams include your primary Cardiologist (physician) and Advanced Practice Providers (APPs -  Physician Assistants and Nurse Practitioners) who all work together to provide you with the care you need, when you need it. You will need a follow up appointment in 2-3 months.  Please call our office 2 months in advance to schedule this appointment.  You may see Sanda Klein, MD or one of the following Advanced Practice Providers on your designated Care Team: [place] [address], [place] . Fabian Sharp, PA-C  Any Other Special Instructions Will Be Listed Below (If Applicable). None       Hilbert Corrigan, [place]  10/28/2018 1:39 PM    [facility] [place], [place], [place]  [address] Phone: [phone]; Fax: [phone]

## 2018-10-27 ENCOUNTER — Telehealth (HOSPITAL_COMMUNITY): Payer: Self-pay

## 2018-10-27 LAB — BASIC METABOLIC PANEL
BUN / CREAT RATIO: 13 (ref 10–24)
BUN: 16 mg/dL (ref 8–27)
CALCIUM: 9.2 mg/dL (ref 8.6–10.2)
CO2: 21 mmol/L (ref 20–29)
Chloride: 106 mmol/L (ref 96–106)
Creatinine, Ser: 1.21 mg/dL (ref 0.76–1.27)
GFR calc non Af Amer: 62 mL/min/{1.73_m2} (ref >59)
GFR, EST AFRICAN AMERICAN: 71 mL/min/{1.73_m2} (ref >59)
Glucose: 93 mg/dL (ref 65–99)
POTASSIUM: 5 mmol/L (ref 3.5–5.2)
Sodium: 144 mmol/L (ref 134–144)

## 2018-10-27 LAB — CBC
HEMATOCRIT: 34.1 % — AB (ref 37.5–51.0)
Hemoglobin: 11.2 g/dL — ABNORMAL LOW (ref 13.0–17.7)
MCH: 29.2 pg (ref 26.6–33.0)
MCHC: 32.8 g/dL (ref 31.5–35.7)
MCV: 89 fL (ref 79–97)
PLATELETS: 297 10*3/uL (ref 150–450)
RBC: 3.83 x10E6/uL — ABNORMAL LOW (ref 4.14–5.80)
RDW: 13.5 % (ref 12.3–15.4)
WBC: 7.9 10*3/uL (ref 3.4–10.8)

## 2018-10-27 NOTE — Telephone Encounter (Signed)
Attempted to call patient in regards to Cardiac Rehab - LM on VM 

## 2018-10-28 ENCOUNTER — Encounter: Payer: Self-pay | Admitting: Physician Assistant

## 2018-10-28 ENCOUNTER — Telehealth (HOSPITAL_COMMUNITY): Payer: Self-pay

## 2018-10-28 NOTE — Progress Notes (Signed)
Renal function improved. Anemia also improved compare to 2 weeks ago

## 2018-10-28 NOTE — Telephone Encounter (Signed)
Wife returned phone call on behalf of pt in regards to Cardiac Rehab - Scheduled orientation on 12/24/18 at 8:15am. Pt will attend the 2:45pm exc class. Mailed packet.

## 2018-10-30 NOTE — Progress Notes (Signed)
Great, thanks MCr 

## 2018-11-06 ENCOUNTER — Other Ambulatory Visit: Payer: Self-pay | Admitting: Cardiothoracic Surgery

## 2018-11-06 DIAGNOSIS — Z951 Presence of aortocoronary bypass graft: Secondary | ICD-10-CM

## 2018-11-09 ENCOUNTER — Other Ambulatory Visit: Payer: Self-pay

## 2018-11-09 ENCOUNTER — Ambulatory Visit
Admission: RE | Admit: 2018-11-09 | Discharge: 2018-11-09 | Disposition: A | Discharge status: Home / Self Care | Payer: Medicare Other | Source: Ambulatory Visit | Attending: Cardiothoracic Surgery | Admitting: Cardiothoracic Surgery

## 2018-11-09 ENCOUNTER — Ambulatory Visit (INDEPENDENT_AMBULATORY_CARE_PROVIDER_SITE_OTHER): Payer: Self-pay | Admitting: Physician Assistant

## 2018-11-09 VITALS — BP 122/74 | HR 54 | Resp 18 | Ht 70.0 in | Wt 220.2 lb

## 2018-11-09 DIAGNOSIS — Z951 Presence of aortocoronary bypass graft: Secondary | ICD-10-CM

## 2018-11-09 MED ORDER — HYDROCODONE-CHLORPHENIRAMINE 5-4 MG/5ML PO SOLN: 5.0000 mL | Freq: Every evening | ORAL | 0 refills | Status: DC | PRN

## 2018-11-09 NOTE — Patient Instructions (Signed)
You may return to driving an automobile as long as you are no longer requiring oral narcotic pain relievers during the daytime.  It would be wise to start driving only short distances during the daylight and gradually increase from there as you feel comfortable.    You may continue to gradually increase your physical activity as tolerated.  Refrain from any heavy lifting or strenuous use of your arms and shoulders until at least 8 weeks from the time of your surgery, and avoid activities that cause increased pain in your chest on the side of your surgical incision.  Otherwise you may continue to increase activities without any particular limitations.  Increase the intensity and duration of physical activity gradually.   You may continue to gradually increase your physical activity as tolerated.  Refrain from any heavy lifting or strenuous use of your arms and shoulders until at least 8 weeks from the time of your surgery, and avoid activities that cause increased pain in your chest on the side of your surgical incision.  Otherwise you may continue to increase activities without any particular limitations.  Increase the intensity and duration of physical activity gradually.

## 2018-11-09 NOTE — Progress Notes (Signed)
HPI:  Patient returns for routine postoperative follow-up having undergone CABG x 4 on 10/06/2018.  The patient's early postoperative recovery while in the hospital was notable for Atrial Fibrillation that required Cardizem and IV Amiodarone to convert the patient back to NSR.  He developed hypotension initially and required use of Midodrine.  This was stopped prior to discharge as he became hypertensive requiring resumption of home Norvasc. Since hospital discharge the patient reports he is doing pretty good.  He does have some level of decreased energy which has been improving since hospital discharge.  He also states that his chest is really sensitive even with a shirt touching the skin.  He is ambulating without difficulty.  He does note that his right leg swells with ambulation, but resolves with rest overnight.  He does have a dry cough with a cold that he is experiencing.  He does ask if he can get a prescription for cough syrup that he is out of currently.  He is already set up for cardiac rehab.   Current Outpatient Medications  Medication Sig Dispense Refill  . amiodarone (PACERONE) 200 MG tablet Take 1 tablet (200 mg total) by mouth 2 (two) times daily. 60 tablet 1  . amLODipine (NORVASC) 5 MG tablet Take 5 mg by mouth daily.     Marland Kitchen aspirin EC 325 MG EC tablet Take 1 tablet (325 mg total) by mouth daily, THEN 1 tablet (325 mg total) daily.    Marland Kitchen atorvastatin (LIPITOR) 80 MG tablet Take 1 tablet (80 mg total) by mouth daily at 6 PM. 30 tablet 1  . metoprolol tartrate (LOPRESSOR) 25 MG tablet Take 1 tablet (25 mg total) by mouth 2 (two) times daily. 90 tablet 1  . tamsulosin (FLOMAX) 0.4 MG CAPS capsule Take 0.4 mg by mouth daily.  0   No current facility-administered medications for this visit.     Physical Exam:  BP 122/74 (BP Location: Right Arm, Patient Position: Sitting, Cuff Size: Normal)   Pulse (!) 54   Resp 18   Ht 5\' 10"  (1.778 m)   Wt 220 lb 3.2 oz (99.9 kg)   SpO2 95%  Comment: RA  BMI 31.60 kg/m   Gen: no apparent distress Heart: RRR, bradycardia  Lungs: CTA bilaterally Abd: soft non-tender, non-distended Ext: no edema currently Incisions: well healed  Diagnostic Tests:  CXR: minimal right sided pleural effusion, no pneumothorax, sternal wires intact  A/P:  1. S/P CABG x 4, doing very well. He had Atrial Fibrillation in the hospital.  He has been maintaining NSR since.  He is bradycardic today, will decrease Amiodarone to 200 mg daily and then stop prescription once empty.  This can be attributing to his decreased energy level 2. Pulm- minimal right pleural effusion, should resolve with time 3. Activity- increase as tolerated, may resume driving.  Instructed to continue lifting restrictions for several more weeks and limit 10-15 lbs for the next several weeks.  He is scheduled to start cardiac rehab in January and this is okay to start this 4. RTC prn, Cardiology as scheduled  Ellwood Handler, PA-C [facility] [phone]

## 2018-12-21 ENCOUNTER — Other Ambulatory Visit: Payer: Self-pay | Admitting: *Deleted

## 2018-12-21 ENCOUNTER — Telehealth (HOSPITAL_COMMUNITY): Payer: Self-pay | Admitting: Pharmacist

## 2018-12-21 DIAGNOSIS — E785 Hyperlipidemia, unspecified: Secondary | ICD-10-CM

## 2018-12-21 LAB — HEPATIC FUNCTION PANEL
ALBUMIN: 4.3 g/dL (ref 3.6–4.8)
ALT: 17 IU/L (ref 0–44)
AST: 26 IU/L (ref 0–40)
Alkaline Phosphatase: 84 IU/L (ref 39–117)
BILIRUBIN TOTAL: 0.8 mg/dL (ref 0.0–1.2)
BILIRUBIN, DIRECT: 0.23 mg/dL (ref 0.00–0.40)
Total Protein: 6.6 g/dL (ref 6.0–8.5)

## 2018-12-21 LAB — LIPID PANEL
CHOLESTEROL TOTAL: 109 mg/dL (ref 100–199)
Chol/HDL Ratio: 2.9 ratio (ref 0.0–5.0)
HDL: 38 mg/dL — ABNORMAL LOW (ref >39)
LDL Calculated: 59 mg/dL (ref 0–99)
TRIGLYCERIDES: 61 mg/dL (ref 0–149)
VLDL Cholesterol Cal: 12 mg/dL (ref 5–40)

## 2018-12-21 NOTE — Progress Notes (Signed)
David [name] 67 y.o. male DOB Jan 08, 1951 MRN [id number]       Nutrition No diagnosis found. Past Medical History:  Diagnosis Date  . Dyslipidemia   . Essential hypertension   . S/P CABG x 4    Meds reviewed.    Current Outpatient Medications (Cardiovascular):  .  amiodarone (PACERONE) 200 MG tablet, Take 1 tablet (200 mg total) by mouth 2 (two) times daily. Marland Kitchen  amLODipine (NORVASC) 5 MG tablet, Take 5 mg by mouth daily.  Marland Kitchen  atorvastatin (LIPITOR) 80 MG tablet, Take 1 tablet (80 mg total) by mouth daily at 6 PM. .  metoprolol tartrate (LOPRESSOR) 25 MG tablet, Take 1 tablet (25 mg total) by mouth 2 (two) times daily.  Current Outpatient Medications (Respiratory):  .  HYDROcodone-Chlorpheniramine 5-4 MG/5ML SOLN, Take 5 mLs by mouth at bedtime as needed (cough).  Current Outpatient Medications (Analgesics):  .  aspirin EC 325 MG EC tablet, Take 1 tablet (325 mg total) by mouth daily, THEN 1 tablet (325 mg total) daily.   Current Outpatient Medications (Other):  .  tamsulosin (FLOMAX) 0.4 MG CAPS capsule, Take 0.4 mg by mouth daily.   HT: Ht Readings from Last 1 Encounters:  11/09/18 5\' 10"  (1.778 m)    WT: Wt Readings from Last 5 Encounters:  11/09/18 220 lb 3.2 oz (99.9 kg)  10/26/18 220 lb 9.6 oz (100.1 kg)  10/13/18 216 lb 12.8 oz (98.3 kg)      BMI = 31.6  (11/09/18)  Current tobacco use? No       Labs:  Lipid Panel     Component Value Date/Time   CHOL 140 10/02/2018 0830   TRIG 74 10/02/2018 0830   HDL 34 (L) 10/02/2018 0830   CHOLHDL 4.1 10/02/2018 0830   VLDL 15 10/02/2018 0830   LDLCALC 91 10/02/2018 0830    Lab Results  Component Value Date   HGBA1C 5.8 (H) 10/02/2018   CBG (last 3)  No results for input(s): GLUCAP in the last 72 hours.  Nutrition Diagnosis ? Food-and nutrition-related knowledge deficit related to lack of exposure to information as related to diagnosis of: ? CVD  ? Obese  I = 30-34.9 related to excessive energy intake as  evidenced by a  BMI = 31.6  (11/09/18)  Nutrition Goal(s):  ? To be determined  Plan:  Pt to attend nutrition classes ? Nutrition I ? Nutrition II ? Portion Distortion  Will provide client-centered nutrition education as part of interdisciplinary care.   Monitor and evaluate progress toward nutrition goal with team.  Laurina Bustle, MS, RD, LDN 12/21/2018 11:45 AM

## 2018-12-24 ENCOUNTER — Telehealth: Payer: Self-pay | Admitting: Physician Assistant

## 2018-12-24 ENCOUNTER — Encounter (HOSPITAL_COMMUNITY)
Admission: RE | Admit: 2018-12-24 | Discharge: 2018-12-24 | Disposition: A | Discharge status: Home / Self Care | Payer: Medicare Other | Source: Ambulatory Visit | Attending: Cardiovascular Disease | Admitting: Cardiovascular Disease

## 2018-12-24 ENCOUNTER — Encounter (HOSPITAL_COMMUNITY): Payer: Self-pay

## 2018-12-24 VITALS — BP 122/78 | HR 67 | Ht 71.0 in | Wt 217.4 lb

## 2018-12-24 DIAGNOSIS — Z951 Presence of aortocoronary bypass graft: Secondary | ICD-10-CM | POA: Diagnosis present

## 2018-12-24 NOTE — Telephone Encounter (Signed)
The patient has been notified of the result and verbalized understanding.  All questions (if any) were answered. Jacqulynn Cadet, [place] 12/24/2018 2:23 PM

## 2018-12-24 NOTE — Progress Notes (Signed)
Cardiac Rehab Medication Review by a RN  Does the patient  feel that his/her medications are working for him/her?  yes  Has the patient been experiencing any side effects to the medications prescribed?  no  Does the patient measure his/her own blood pressure or blood glucose at home?  yes [name] checks his BP at home and keeps a log.   Does the patient have any problems obtaining medications due to transportation or finances?   no  Understanding of regimen: excellent Understanding of indications: excellent Potential of compliance: excellent    RN comments: N/A    David [name] 12/24/2018 8:13 AM

## 2018-12-24 NOTE — Progress Notes (Signed)
The patient has been notified of the result and verbalized understanding.  All questions (if any) were answered. Jacqulynn Cadet, [place] 12/24/2018 2:21 PM

## 2018-12-24 NOTE — Progress Notes (Signed)
David [name] 68 y.o. male DOB: 1951/03/02 MRN: [id number]      Nutrition Note  1. S/P CABG x 4    Past Medical History:  Diagnosis Date  . Dyslipidemia   . Essential hypertension   . S/P CABG x 4    Meds reviewed.    Current Outpatient Medications (Cardiovascular):  .  amLODipine (NORVASC) 5 MG tablet, Take 5 mg by mouth daily.  Marland Kitchen  atorvastatin (LIPITOR) 80 MG tablet, Take 1 tablet (80 mg total) by mouth daily at 6 PM. .  metoprolol tartrate (LOPRESSOR) 25 MG tablet, Take 1 tablet (25 mg total) by mouth 2 (two) times daily. Marland Kitchen  amiodarone (PACERONE) 200 MG tablet, Take 1 tablet (200 mg total) by mouth 2 (two) times daily. (Patient not taking: Reported on 12/24/2018)  Current Outpatient Medications (Respiratory):  .  HYDROcodone-Chlorpheniramine 5-4 MG/5ML SOLN, Take 5 mLs by mouth at bedtime as needed (cough). (Patient not taking: Reported on 12/24/2018)  Current Outpatient Medications (Analgesics):  .  aspirin EC 325 MG EC tablet, Take 1 tablet (325 mg total) by mouth daily, THEN 1 tablet (325 mg total) daily.   Current Outpatient Medications (Other):  .  tamsulosin (FLOMAX) 0.4 MG CAPS capsule, Take 0.4 mg by mouth daily.   HT: Ht Readings from Last 1 Encounters:  12/24/18 5\' 11"  (1.803 m)    WT: Wt Readings from Last 5 Encounters:  12/24/18 217 lb 6 oz (98.6 kg)  11/09/18 220 lb 3.2 oz (99.9 kg)  10/26/18 220 lb 9.6 oz (100.1 kg)  10/13/18 216 lb 12.8 oz (98.3 kg)     Body mass index is 30.32 kg/m.   Current tobacco use? No  Labs:  Lipid Panel     Component Value Date/Time   CHOL 109 12/21/2018 0810   TRIG 61 12/21/2018 0810   HDL 38 (L) 12/21/2018 0810   CHOLHDL 2.9 12/21/2018 0810   CHOLHDL 4.1 10/02/2018 0830   VLDL 15 10/02/2018 0830   LDLCALC 59 12/21/2018 0810    Lab Results  Component Value Date   HGBA1C 5.8 (H) 10/02/2018   CBG (last 3)  No results for input(s): GLUCAP in the last 72 hours.  Nutrition Note Spoke with pt. Nutrition plan and  goals reviewed with pt. Pt is following Step 2 of the Therapeutic Lifestyle Changes diet. Pt wants to lose wt, thinks losing 5-10 lbs would be nice. Pt has more of a focus on getting his stamina back. Pt has not been trying to lose wt. Heart healthy weight loss tips reviewed (label reading, how to build a healthy plate, portion sizes, eating frequently across the day). Set goal with patient to work on label reading to help decrease consumption of refined carbohydrates and sodium. Pt's last A1c was elevated at 5.8. Reviewed the differences between refined and complex carbohydrates. Recommended pt replace refined carbohydrates with complex. Per discussion, pt does not use canned/convenience foods often. Pt does add salt to food. Pt does not eat out frequently. Pt expressed understanding of the information reviewed. Pt aware of nutrition education classes offered and would like to attend nutrition classes.  Nutrition Diagnosis ? Food-and nutrition-related knowledge deficit related to lack of exposure to information as related to diagnosis of: ? CVD ? Pre-diabetes ? Obese  I = 30-34.9 related to excessive energy intake as evidenced by a Body mass index is 30.32 kg/m.  Nutrition Intervention ? Pt's individual nutrition plan and goals reviewed with pt. ? Pt given handouts for: ? Nutrition I  class ? Nutrition II class   ? pre-diabetes  Nutrition Goal(s):  ? Pt to identify and limit food sources of saturated fat, trans fat, refined carbohydrates and sodium ? Pt to identify food quantities necessary to achieve weight loss of 6-24 lbs. at graduation from cardiac rehab.  ? Pt to eat a variety of non-starchy vegetables. ? Pt able to name foods that affect blood glucose.   Plan:  ? Pt to attend nutrition classes ? Nutrition I ? Nutrition II ? Portion Distortion  ? Will provide client-centered nutrition education as part of interdisciplinary care ? Monitor and evaluate progress toward nutrition goal with  team.   Laurina Bustle, MS, RD, LDN 12/24/2018 10:57 AM

## 2018-12-24 NOTE — Progress Notes (Signed)
Cardiac Individual Treatment Plan  Patient Details  Name: David [name] MRN: [id number] Date of Birth: 04/23/1951 Referring Provider:     [facility] from 12/24/2018 in [place]  Referring Provider  Sanda Klein MD       Initial Encounter Date:    CARDIAC REHAB PHASE II ORIENTATION from 12/24/2018 in [place]  Date  12/24/18      Visit Diagnosis: S/P CABG x 4  Patient's Home Medications on Admission:  Current Outpatient Medications:  .  amLODipine (NORVASC) 5 MG tablet, Take 5 mg by mouth daily. , Disp: , Rfl:  .  aspirin EC 325 MG EC tablet, Take 1 tablet (325 mg total) by mouth daily, THEN 1 tablet (325 mg total) daily., Disp: , Rfl:  .  atorvastatin (LIPITOR) 80 MG tablet, Take 1 tablet (80 mg total) by mouth daily at 6 PM., Disp: 30 tablet, Rfl: 1 .  metoprolol tartrate (LOPRESSOR) 25 MG tablet, Take 1 tablet (25 mg total) by mouth 2 (two) times daily., Disp: 90 tablet, Rfl: 1 .  tamsulosin (FLOMAX) 0.4 MG CAPS capsule, Take 0.4 mg by mouth daily., Disp: , Rfl: 0 .  amiodarone (PACERONE) 200 MG tablet, Take 1 tablet (200 mg total) by mouth 2 (two) times daily. (Patient not taking: Reported on 12/24/2018), Disp: 60 tablet, Rfl: 1 .  HYDROcodone-Chlorpheniramine 5-4 MG/5ML SOLN, Take 5 mLs by mouth at bedtime as needed (cough). (Patient not taking: Reported on 12/24/2018), Disp: 100 mL, Rfl: 0  Past Medical History: Past Medical History:  Diagnosis Date  . Dyslipidemia   . Essential hypertension   . S/P CABG x 4     Tobacco Use: Social History   Tobacco Use  Smoking Status Never Smoker  Smokeless Tobacco Never Used    Labs: Recent Review Flowsheet Data    Labs for ITP Cardiac and Pulmonary Rehab Latest Ref Rng & Units 10/06/2018 10/06/2018 10/07/2018 10/08/2018 12/21/2018   Cholestrol 100 - 199 mg/dL - - - - 109   LDLCALC 0 - 99 mg/dL - - - - 59   HDL >39 mg/dL - - - - 38(L)    Trlycerides 0 - 149 mg/dL - - - - 61   Hemoglobin A1c 4.8 - 5.6 % - - - - -   PHART 7.350 - 7.450 7.369 - - - -   PCO2ART 32.0 - 48.0 mmHg 36.4 - - - -   HCO3 20.0 - 28.0 mmol/L 21.0 - - - -   TCO2 22 - 32 mmol/L 22 22 25 25  -   ACIDBASEDEF 0.0 - 2.0 mmol/L 4.0(H) - - - -   O2SAT % 94.0 - - - -      Capillary Blood Glucose: Lab Results  Component Value Date   GLUCAP 102 (H) 10/11/2018   GLUCAP 109 (H) 10/11/2018   GLUCAP 97 10/10/2018   GLUCAP 109 (H) 10/10/2018   GLUCAP 91 10/10/2018     Exercise Target Goals: Exercise Program Goal: Individual exercise prescription set using results from initial 6 min walk test and THRR while considering  patient's activity barriers and safety.   Exercise Prescription Goal: Initial exercise prescription builds to 30-45 minutes a day of aerobic activity, 2-3 days per week.  Home exercise guidelines will be given to patient during program as part of exercise prescription that the participant will acknowledge.  Activity Barriers & Risk Stratification: Activity Barriers & Cardiac Risk Stratification - 12/24/18 1497  Activity Barriers & Cardiac Risk Stratification   Activity Barriers  None    Cardiac Risk Stratification  High       6 Minute Walk: 6 Minute Walk    Row Name 12/24/18 0942         6 Minute Walk   Phase  Initial     Distance  1867 feet     Walk Time  6 minutes     # of Rest Breaks  0     MPH  3.54     METS  3.75     RPE  10     Perceived Dyspnea   0     VO2 Peak  13.13     Symptoms  No     Resting HR  67 bpm     Resting BP  122/78     Resting Oxygen Saturation   97 %     Exercise Oxygen Saturation  during 6 min walk  98 %     Max Ex. HR  76 bpm     Max Ex. BP  140/82     2 Minute Post BP  124/82        Oxygen Initial Assessment:   Oxygen Re-Evaluation:   Oxygen Discharge (Final Oxygen Re-Evaluation):   Initial Exercise Prescription: Initial Exercise Prescription - 12/24/18 0900      Date of  Initial Exercise RX and Referring Provider   Date  12/24/18    Referring Provider  Sanda Klein MD     Expected Discharge Date  04/02/19      Recumbant Bike   Level  2.5    Watts  50    Minutes  10    METs  3.57      NuStep   Level  3    SPM  85    Minutes  10    METs  3      Track   Laps  14    Minutes  10    METs  3.45      Prescription Details   Frequency (times per week)  3x    Duration  Progress to 30 minutes of continuous aerobic without signs/symptoms of physical distress      Intensity   THRR 40-80% of Max Heartrate  61-122    Ratings of Perceived Exertion  11-13    Perceived Dyspnea  0-4      Progression   Progression  Continue progressive overload as per policy without signs/symptoms or physical distress.      Resistance Training   Training Prescription  Yes    Weight  4lbs    Reps  10-15       Perform Capillary Blood Glucose checks as needed.  Exercise Prescription Changes:   Exercise Comments:   Exercise Goals and Review:  Exercise Goals    Row Name 12/24/18 0837             Exercise Goals   Increase Physical Activity  Yes       Intervention  Provide advice, education, support and counseling about physical activity/exercise needs.;Develop an individualized exercise prescription for aerobic and resistive training based on initial evaluation findings, risk stratification, comorbidities and participant's personal goals.       Expected Outcomes  Short Term: Attend rehab on a regular basis to increase amount of physical activity.       Increase Strength and Stamina  Yes       Intervention  Provide  advice, education, support and counseling about physical activity/exercise needs.;Develop an individualized exercise prescription for aerobic and resistive training based on initial evaluation findings, risk stratification, comorbidities and participant's personal goals.       Expected Outcomes  Short Term: Increase workloads from initial exercise  prescription for resistance, speed, and METs.       Able to understand and use rate of perceived exertion (RPE) scale  Yes       Intervention  Provide education and explanation on how to use RPE scale       Expected Outcomes  Short Term: Able to use RPE daily in rehab to express subjective intensity level;Long Term:  Able to use RPE to guide intensity level when exercising independently       Knowledge and understanding of Target Heart Rate Range (THRR)  Yes       Intervention  Provide education and explanation of THRR including how the numbers were predicted and where they are located for reference       Expected Outcomes  Short Term: Able to state/look up THRR;Long Term: Able to use THRR to govern intensity when exercising independently;Short Term: Able to use daily as guideline for intensity in rehab       Able to check pulse independently  Yes       Intervention  Provide education and demonstration on how to check pulse in carotid and radial arteries.;Review the importance of being able to check your own pulse for safety during independent exercise       Expected Outcomes  Short Term: Able to explain why pulse checking is important during independent exercise;Long Term: Able to check pulse independently and accurately       Understanding of Exercise Prescription  Yes       Intervention  Provide education, explanation, and written materials on patient's individual exercise prescription       Expected Outcomes  Short Term: Able to explain program exercise prescription;Long Term: Able to explain home exercise prescription to exercise independently          Exercise Goals Re-Evaluation :   Discharge Exercise Prescription (Final Exercise Prescription Changes):   Nutrition:  Target Goals: Understanding of nutrition guidelines, daily intake of sodium 1500mg , cholesterol 200mg , calories 30% from fat and 7% or less from saturated fats, daily to have 5 or more servings of fruits and  vegetables.  Biometrics: Pre Biometrics - 12/24/18 0943      Pre Biometrics   Height  5\' 11"  (1.803 m)    Weight  98.6 kg    Waist Circumference  42 inches    Hip Circumference  41 inches    Waist to Hip Ratio  1.02 %    BMI (Calculated)  30.33    Triceps Skinfold  32 mm    % Body Fat  32.2 %    Grip Strength  35 kg    Flexibility  7.5 in    Single Leg Stand  30 seconds        Nutrition Therapy Plan and Nutrition Goals:   Nutrition Assessments:   Nutrition Goals Re-Evaluation:   Nutrition Goals Re-Evaluation:   Nutrition Goals Discharge (Final Nutrition Goals Re-Evaluation):   Psychosocial: Target Goals: Acknowledge presence or absence of significant depression and/or stress, maximize coping skills, provide positive support system. Participant is able to verbalize types and ability to use techniques and skills needed for reducing stress and depression.  Initial Review & Psychosocial Screening: Initial Psych Review & Screening - 12/24/18  0848      Initial Review   Current issues with  None Identified      Family Dynamics   Good Support System?  Yes   Pt lists his wife, family, and friends as sources of support. [name]'s wife was present at today's orientation session.      Barriers   Psychosocial barriers to participate in program  There are no identifiable barriers or psychosocial needs.      Screening Interventions   Interventions  Encouraged to exercise       Quality of Life Scores: Quality of Life - 12/24/18 0849      Quality of Life   Select  Quality of Life      Quality of Life Scores   Health/Function Pre  30 %    Socioeconomic Pre  30 %    Psych/Spiritual Pre  30 %    Family Pre  30 %    GLOBAL Pre  30 %      Scores of 19 and below usually indicate a poorer quality of life in these areas.  A difference of  2-3 points is a clinically meaningful difference.  A difference of 2-3 points in the total score of the Quality of Life Index has been  associated with significant improvement in overall quality of life, self-image, physical symptoms, and general health in studies assessing change in quality of life.  PHQ-9: Recent Review Flowsheet Data    Depression screen [facility] 2/9 11/09/2018   Decreased Interest 0   Down, Depressed, Hopeless 0   PHQ - 2 Score 0     Interpretation of Total Score  Total Score Depression Severity:  1-4 = Minimal depression, 5-9 = Mild depression, 10-14 = Moderate depression, 15-19 = Moderately severe depression, 20-27 = Severe depression   Psychosocial Evaluation and Intervention:   Psychosocial Re-Evaluation:   Psychosocial Discharge (Final Psychosocial Re-Evaluation):   Vocational Rehabilitation: Provide vocational rehab assistance to qualifying candidates.   Vocational Rehab Evaluation & Intervention: Vocational Rehab - 12/24/18 0939      Initial Vocational Rehab Evaluation & Intervention   Assessment shows need for Vocational Rehabilitation  No       Education: Education Goals: Education classes will be provided on a weekly basis, covering required topics. Participant will state understanding/return demonstration of topics presented.  Learning Barriers/Preferences: Learning Barriers/Preferences - 12/24/18 6237      Learning Barriers/Preferences   Learning Barriers  Sight    Learning Preferences  Video;Skilled Demonstration;Pictoral       Education Topics: Count Your Pulse:  -Group instruction provided by verbal instruction, demonstration, patient participation and written materials to support subject.  Instructors address importance of being able to find your pulse and how to count your pulse when at home without a heart monitor.  Patients get hands on experience counting their pulse with staff help and individually.   Heart Attack, Angina, and Risk Factor Modification:  -Group instruction provided by verbal instruction, video, and written materials to support subject.   Instructors address signs and symptoms of angina and heart attacks.    Also discuss risk factors for heart disease and how to make changes to improve heart health risk factors.   Functional Fitness:  -Group instruction provided by verbal instruction, demonstration, patient participation, and written materials to support subject.  Instructors address safety measures for doing things around the house.  Discuss how to get up and down off the floor, how to pick things up properly, how to safely get  out of a chair without assistance, and balance training.   Meditation and Mindfulness:  -Group instruction provided by verbal instruction, patient participation, and written materials to support subject.  Instructor addresses importance of mindfulness and meditation practice to help reduce stress and improve awareness.  Instructor also leads participants through a meditation exercise.    Stretching for Flexibility and Mobility:  -Group instruction provided by verbal instruction, patient participation, and written materials to support subject.  Instructors lead participants through series of stretches that are designed to increase flexibility thus improving mobility.  These stretches are additional exercise for major muscle groups that are typically performed during regular warm up and cool down.   Hands Only CPR:  -Group verbal, video, and participation provides a basic overview of AHA guidelines for community CPR. Role-play of emergencies allow participants the opportunity to practice calling for help and chest compression technique with discussion of AED use.   Hypertension: -Group verbal and written instruction that provides a basic overview of hypertension including the most recent diagnostic guidelines, risk factor reduction with self-care instructions and medication management.    Nutrition I class: Heart Healthy Eating:  -Group instruction provided by PowerPoint slides, verbal discussion, and  written materials to support subject matter. The instructor gives an explanation and review of the Therapeutic Lifestyle Changes diet recommendations, which includes a discussion on lipid goals, dietary fat, sodium, fiber, plant stanol/sterol esters, sugar, and the components of a well-balanced, healthy diet.   Nutrition II class: Lifestyle Skills:  -Group instruction provided by PowerPoint slides, verbal discussion, and written materials to support subject matter. The instructor gives an explanation and review of label reading, grocery shopping for heart health, heart healthy recipe modifications, and ways to make healthier choices when eating out.   Diabetes Question & Answer:  -Group instruction provided by PowerPoint slides, verbal discussion, and written materials to support subject matter. The instructor gives an explanation and review of diabetes co-morbidities, pre- and post-prandial blood glucose goals, pre-exercise blood glucose goals, signs, symptoms, and treatment of hypoglycemia and hyperglycemia, and foot care basics.   Diabetes Blitz:  -Group instruction provided by PowerPoint slides, verbal discussion, and written materials to support subject matter. The instructor gives an explanation and review of the physiology behind type 1 and type 2 diabetes, diabetes medications and rational behind using different medications, pre- and post-prandial blood glucose recommendations and Hemoglobin A1c goals, diabetes diet, and exercise including blood glucose guidelines for exercising safely.    Portion Distortion:  -Group instruction provided by PowerPoint slides, verbal discussion, written materials, and food models to support subject matter. The instructor gives an explanation of serving size versus portion size, changes in portions sizes over the last 20 years, and what consists of a serving from each food group.   Stress Management:  -Group instruction provided by verbal instruction,  video, and written materials to support subject matter.  Instructors review role of stress in heart disease and how to cope with stress positively.     Exercising on Your Own:  -Group instruction provided by verbal instruction, power point, and written materials to support subject.  Instructors discuss benefits of exercise, components of exercise, frequency and intensity of exercise, and end points for exercise.  Also discuss use of nitroglycerin and activating EMS.  Review options of places to exercise outside of rehab.  Review guidelines for sex with heart disease.   Cardiac Drugs I:  -Group instruction provided by verbal instruction and written materials to support subject.  Instructor reviews  cardiac drug classes: antiplatelets, anticoagulants, beta blockers, and statins.  Instructor discusses reasons, side effects, and lifestyle considerations for each drug class.   Cardiac Drugs II:  -Group instruction provided by verbal instruction and written materials to support subject.  Instructor reviews cardiac drug classes: angiotensin converting enzyme inhibitors (ACE-I), angiotensin II receptor blockers (ARBs), nitrates, and calcium channel blockers.  Instructor discusses reasons, side effects, and lifestyle considerations for each drug class.   Anatomy and Physiology of the Circulatory System:  Group verbal and written instruction and models provide basic cardiac anatomy and physiology, with the coronary electrical and arterial systems. Review of: AMI, Angina, Valve disease, Heart Failure, Peripheral Artery Disease, Cardiac Arrhythmia, Pacemakers, and the ICD.   Other Education:  -Group or individual verbal, written, or video instructions that support the educational goals of the cardiac rehab program.   Holiday Eating Survival Tips:  -Group instruction provided by PowerPoint slides, verbal discussion, and written materials to support subject matter. The instructor gives patients tips,  tricks, and techniques to help them not only survive but enjoy the holidays despite the onslaught of food that accompanies the holidays.   Knowledge Questionnaire Score: Knowledge Questionnaire Score - 12/24/18 0831      Knowledge Questionnaire Score   Pre Score  22/24       Core Components/Risk Factors/Patient Goals at Admission: Personal Goals and Risk Factors at Admission - 12/24/18 0839      Core Components/Risk Factors/Patient Goals on Admission    Weight Management  Yes;Weight Maintenance;Weight Loss    Intervention  Weight Management: Develop a combined nutrition and exercise program designed to reach desired caloric intake, while maintaining appropriate intake of nutrient and fiber, sodium and fats, and appropriate energy expenditure required for the weight goal.;Weight Management: Provide education and appropriate resources to help participant work on and attain dietary goals.;Weight Management/Obesity: Establish reasonable short term and long term weight goals.    Admit Weight  217 lb 6 oz (98.6 kg)    Expected Outcomes  Short Term: Continue to assess and modify interventions until short term weight is achieved;Long Term: Adherence to nutrition and physical activity/exercise program aimed toward attainment of established weight goal;Weight Maintenance: Understanding of the daily nutrition guidelines, which includes 25-35% calories from fat, 7% or less cal from saturated fats, less than 200mg  cholesterol, less than 1.5gm of sodium, & 5 or more servings of fruits and vegetables daily;Weight Loss: Understanding of general recommendations for a balanced deficit meal plan, which promotes 1-2 lb weight loss per week and includes a negative energy balance of [phone] kcal/d;Understanding recommendations for meals to include 15-35% energy as protein, 25-35% energy from fat, 35-60% energy from carbohydrates, less than 200mg  of dietary cholesterol, 20-35 gm of total fiber daily;Understanding of  distribution of calorie intake throughout the day with the consumption of 4-5 meals/snacks    Hypertension  Yes    Intervention  Provide education on lifestyle modifcations including regular physical activity/exercise, weight management, moderate sodium restriction and increased consumption of fresh fruit, vegetables, and low fat dairy, alcohol moderation, and smoking cessation.;Monitor prescription use compliance.    Expected Outcomes  Short Term: Continued assessment and intervention until BP is < 140/20mm HG in hypertensive participants. < 130/45mm HG in hypertensive participants with diabetes, heart failure or chronic kidney disease.;Long Term: Maintenance of blood pressure at goal levels.    Lipids  Yes    Intervention  Provide education and support for participant on nutrition & aerobic/resistive exercise along with prescribed medications to achieve LDL <  70mg , HDL >40mg .    Expected Outcomes  Short Term: Participant states understanding of desired cholesterol values and is compliant with medications prescribed. Participant is following exercise prescription and nutrition guidelines.;Long Term: Cholesterol controlled with medications as prescribed, with individualized exercise RX and with personalized nutrition plan. Value goals: LDL < 70mg , HDL > 40 mg.    Stress  Yes    Intervention  Offer individual and/or small group education and counseling on adjustment to heart disease, stress management and health-related lifestyle change. Teach and support self-help strategies.;Refer participants experiencing significant psychosocial distress to appropriate mental health specialists for further evaluation and treatment. When possible, include family members and significant others in education/counseling sessions.    Expected Outcomes  Short Term: Participant demonstrates changes in health-related behavior, relaxation and other stress management skills, ability to obtain effective social support, and compliance  with psychotropic medications if prescribed.;Long Term: Emotional wellbeing is indicated by absence of clinically significant psychosocial distress or social isolation.       Core Components/Risk Factors/Patient Goals Review:    Core Components/Risk Factors/Patient Goals at Discharge (Final Review):    ITP Comments: ITP Comments    Row Name 12/24/18 0825           ITP Comments  Dr. Fransico Him, Medical Director          Comments: Patient attended orientation from [phone] to 0930 to review rules and guidelines for program. Completed 6 minute walk test, Intitial ITP, and exercise prescription.  VSS. Telemetry-SR.  Asymptomatic.

## 2018-12-24 NOTE — Telephone Encounter (Signed)
Patient returned call for lab results.  

## 2018-12-28 ENCOUNTER — Ambulatory Visit (HOSPITAL_COMMUNITY): Payer: Medicare Other

## 2018-12-28 ENCOUNTER — Encounter (HOSPITAL_COMMUNITY)
Admission: RE | Admit: 2018-12-28 | Discharge: 2018-12-28 | Disposition: A | Discharge status: Home / Self Care | Payer: Medicare Other | Source: Ambulatory Visit | Attending: Cardiovascular Disease | Admitting: Cardiovascular Disease

## 2018-12-28 DIAGNOSIS — Z951 Presence of aortocoronary bypass graft: Secondary | ICD-10-CM | POA: Diagnosis not present

## 2018-12-28 NOTE — Progress Notes (Signed)
Daily Session Note  Patient Details  Name: David [name] MRN: [id number] Date of Birth: [birth year] Referring Provider:     CARDIAC REHAB PHASE II ORIENTATION from 12/24/2018 in [place]  Referring Provider  Sanda Klein MD       Encounter Date: 12/28/2018  Check In: Session Check In - 12/28/18 1453      Check-In   Supervising physician immediately available to respond to emergencies  [facility] immediately available    Physician(s)  Dr. Tana Coast    Location  [facility]    Staff Present  Dorma Russell, MS,ACSM CEP, Exercise Physiologist;Laurinda Carreno, RN, Deland Pretty, MS, ACSM CEP, Exercise Physiologist    Medication changes reported      No    Fall or balance concerns reported     No    Tobacco Cessation  No Change    Warm-up and Cool-down  Performed as group-led instruction    Resistance Training Performed  Yes    VAD Patient?  No    PAD/SET Patient?  No      Pain Assessment   Currently in Pain?  No/denies    Pain Score  0-No pain    Multiple Pain Sites  No       Capillary Blood Glucose: No results found for this or any previous visit (from the past 24 hour(s)).    Social History   Tobacco Use  Smoking Status Never Smoker  Smokeless Tobacco Never Used    Goals Met:  Exercise tolerated well No report of cardiac concerns or symptoms  Goals Unmet:  Not Applicable  Comments: Pt started cardiac rehab today.  Pt tolerated light exercise without difficulty. VSS, telemetry-Sinus Rhythm, asymptomatic.  Medication list reconciled. Pt denies barriers to medicaiton compliance.  PSYCHOSOCIAL ASSESSMENT:  PHQ-0. Pt exhibits positive coping skills, hopeful outlook with supportive family. No psychosocial needs identified at this time, no psychosocial interventions necessary.    Pt enjoys hiking and playing golf.   Pt oriented to exercise equipment and routine.    Understanding verbalized.Barnet Pall,  RN,BSN 12/28/2018 3:07 PM   Dr. Fransico Him is Medical Director for Cardiac Rehab at [facility].

## 2018-12-30 ENCOUNTER — Ambulatory Visit (HOSPITAL_COMMUNITY): Payer: Medicare Other

## 2018-12-30 ENCOUNTER — Encounter (HOSPITAL_COMMUNITY)
Admission: RE | Admit: 2018-12-30 | Discharge: 2018-12-30 | Disposition: A | Discharge status: Home / Self Care | Payer: Medicare Other | Source: Ambulatory Visit | Attending: Cardiovascular Disease | Admitting: Cardiovascular Disease

## 2018-12-30 VITALS — BP 124/81 | HR 69 | Wt 214.9 lb

## 2018-12-30 DIAGNOSIS — Z951 Presence of aortocoronary bypass graft: Secondary | ICD-10-CM | POA: Diagnosis not present

## 2018-12-31 NOTE — Progress Notes (Signed)
Cardiac Individual Treatment Plan  Patient Details  Name: David [name] MRN: [id number] Date of Birth: 1951/05/04 Referring Provider:     [facility] from 12/24/2018 in [place]  Referring Provider  Dani Gobble, [place] MD       Initial Encounter Date:    CARDIAC REHAB PHASE II ORIENTATION from 12/24/2018 in [place]  Date  12/24/18      Visit Diagnosis: S/P CABG x 4  Patient's Home Medications on Admission:  Current Outpatient Medications:  .  amiodarone (PACERONE) 200 MG tablet, Take 1 tablet (200 mg total) by mouth 2 (two) times daily. (Patient not taking: Reported on 12/24/2018), Disp: 60 tablet, Rfl: 1 .  amLODipine (NORVASC) 5 MG tablet, Take 5 mg by mouth daily. , Disp: , Rfl:  .  aspirin EC 325 MG EC tablet, Take 1 tablet (325 mg total) by mouth daily, THEN 1 tablet (325 mg total) daily., Disp: , Rfl:  .  atorvastatin (LIPITOR) 80 MG tablet, Take 1 tablet (80 mg total) by mouth daily at 6 PM., Disp: 30 tablet, Rfl: 1 .  HYDROcodone-Chlorpheniramine 5-4 MG/5ML SOLN, Take 5 mLs by mouth at bedtime as needed (cough). (Patient not taking: Reported on 12/24/2018), Disp: 100 mL, Rfl: 0 .  metoprolol tartrate (LOPRESSOR) 25 MG tablet, Take 1 tablet (25 mg total) by mouth 2 (two) times daily., Disp: 90 tablet, Rfl: 1 .  tamsulosin (FLOMAX) 0.4 MG CAPS capsule, Take 0.4 mg by mouth daily., Disp: , Rfl: 0  Past Medical History: Past Medical History:  Diagnosis Date  . Dyslipidemia   . Essential hypertension   . S/P CABG x 4     Tobacco Use: Social History   Tobacco Use  Smoking Status Never Smoker  Smokeless Tobacco Never Used    Labs: Recent Review Flowsheet Data    Labs for ITP Cardiac and Pulmonary Rehab Latest Ref Rng & Units 10/06/2018 10/06/2018 10/07/2018 10/08/2018 12/21/2018   Cholestrol 100 - 199 mg/dL - - - - 109   LDLCALC 0 - 99 mg/dL - - - - 59   HDL >39 mg/dL - - - - 38(L)    Trlycerides 0 - 149 mg/dL - - - - 61   Hemoglobin A1c 4.8 - 5.6 % - - - - -   PHART 7.350 - 7.450 7.369 - - - -   PCO2ART 32.0 - 48.0 mmHg 36.4 - - - -   HCO3 20.0 - 28.0 mmol/L 21.0 - - - -   TCO2 22 - 32 mmol/L 22 22 25 25  -   ACIDBASEDEF 0.0 - 2.0 mmol/L 4.0(H) - - - -   O2SAT % 94.0 - - - -      Capillary Blood Glucose: Lab Results  Component Value Date   GLUCAP 102 (H) 10/11/2018   GLUCAP 109 (H) 10/11/2018   GLUCAP 97 10/10/2018   GLUCAP 109 (H) 10/10/2018   GLUCAP 91 10/10/2018     Exercise Target Goals: Exercise Program Goal: Individual exercise prescription set using results from initial 6 min walk test and THRR while considering  patient's activity barriers and safety.   Exercise Prescription Goal: Initial exercise prescription builds to 30-45 minutes a day of aerobic activity, 2-3 days per week.  Home exercise guidelines will be given to patient during program as part of exercise prescription that the participant will acknowledge.  Activity Barriers & Risk Stratification: Activity Barriers & Cardiac Risk Stratification - 12/24/18 1275  Activity Barriers & Cardiac Risk Stratification   Activity Barriers  None    Cardiac Risk Stratification  High       6 Minute Walk: 6 Minute Walk    Row Name 12/24/18 0942         6 Minute Walk   Phase  Initial     Distance  1867 feet     Walk Time  6 minutes     # of Rest Breaks  0     MPH  3.54     METS  3.75     RPE  10     Perceived Dyspnea   0     VO2 Peak  13.13     Symptoms  No     Resting HR  67 bpm     Resting BP  122/78     Resting Oxygen Saturation   97 %     Exercise Oxygen Saturation  during 6 min walk  98 %     Max Ex. HR  76 bpm     Max Ex. BP  140/82     2 Minute Post BP  124/82        Oxygen Initial Assessment:   Oxygen Re-Evaluation:   Oxygen Discharge (Final Oxygen Re-Evaluation):   Initial Exercise Prescription: Initial Exercise Prescription - 12/24/18 0900      Date of  Initial Exercise RX and Referring Provider   Date  12/24/18    Referring Provider  Sanda Klein MD     Expected Discharge Date  04/02/19      Recumbant Bike   Level  2.5    Watts  50    Minutes  10    METs  3.57      NuStep   Level  3    SPM  85    Minutes  10    METs  3      Track   Laps  14    Minutes  10    METs  3.45      Prescription Details   Frequency (times per week)  3x    Duration  Progress to 30 minutes of continuous aerobic without signs/symptoms of physical distress      Intensity   THRR 40-80% of Max Heartrate  61-122    Ratings of Perceived Exertion  11-13    Perceived Dyspnea  0-4      Progression   Progression  Continue progressive overload as per policy without signs/symptoms or physical distress.      Resistance Training   Training Prescription  Yes    Weight  4lbs    Reps  10-15       Perform Capillary Blood Glucose checks as needed.  Exercise Prescription Changes:  Exercise Prescription Changes    Row Name 12/28/18 1449             Response to Exercise   Blood Pressure (Admit)  126/82       Blood Pressure (Exercise)  122/80       Blood Pressure (Exit)  116/80       Heart Rate (Admit)  66 bpm       Heart Rate (Exercise)  83 bpm       Heart Rate (Exit)  63 bpm       Rating of Perceived Exertion (Exercise)  12       Symptoms  none       Duration  Progress to 30  minutes of  aerobic without signs/symptoms of physical distress       Intensity  THRR unchanged         Progression   Progression  Continue to progress workloads to maintain intensity without signs/symptoms of physical distress.       Average METs  3.2         Resistance Training   Training Prescription  Yes       Weight  4lbs       Reps  10-15       Time  10 Minutes         Interval Training   Interval Training  No         Recumbant Bike   Level  2.5       Watts  50       Minutes  10       METs  3.4         NuStep   Level  3       SPM  85       Minutes   10       METs  2.6         Track   Laps  15       Minutes  10       METs  3.6          Exercise Comments:  Exercise Comments    Row Name 12/28/18 1536           Exercise Comments  Patient tolerated exercise well without c/o.          Exercise Goals and Review:  Exercise Goals    Row Name 12/24/18 0837             Exercise Goals   Increase Physical Activity  Yes       Intervention  Provide advice, education, support and counseling about physical activity/exercise needs.;Develop an individualized exercise prescription for aerobic and resistive training based on initial evaluation findings, risk stratification, comorbidities and participant's personal goals.       Expected Outcomes  Short Term: Attend rehab on a regular basis to increase amount of physical activity.       Increase Strength and Stamina  Yes       Intervention  Provide advice, education, support and counseling about physical activity/exercise needs.;Develop an individualized exercise prescription for aerobic and resistive training based on initial evaluation findings, risk stratification, comorbidities and participant's personal goals.       Expected Outcomes  Short Term: Increase workloads from initial exercise prescription for resistance, speed, and METs.       Able to understand and use rate of perceived exertion (RPE) scale  Yes       Intervention  Provide education and explanation on how to use RPE scale       Expected Outcomes  Short Term: Able to use RPE daily in rehab to express subjective intensity level;Long Term:  Able to use RPE to guide intensity level when exercising independently       Knowledge and understanding of Target Heart Rate Range (THRR)  Yes       Intervention  Provide education and explanation of THRR including how the numbers were predicted and where they are located for reference       Expected Outcomes  Short Term: Able to state/look up THRR;Long Term: Able to use THRR to govern  intensity when exercising independently;Short Term: Able to use daily as  guideline for intensity in rehab       Able to check pulse independently  Yes       Intervention  Provide education and demonstration on how to check pulse in carotid and radial arteries.;Review the importance of being able to check your own pulse for safety during independent exercise       Expected Outcomes  Short Term: Able to explain why pulse checking is important during independent exercise;Long Term: Able to check pulse independently and accurately       Understanding of Exercise Prescription  Yes       Intervention  Provide education, explanation, and written materials on patient's individual exercise prescription       Expected Outcomes  Short Term: Able to explain program exercise prescription;Long Term: Able to explain home exercise prescription to exercise independently          Exercise Goals Re-Evaluation : Exercise Goals Re-Evaluation    Row Name 12/28/18 1536             Exercise Goal Re-Evaluation   Exercise Goals Review  Increase Physical Activity;Able to understand and use rate of perceived exertion (RPE) scale       Comments  Patient able to understand and use RPE scale appropriately.       Expected Outcomes  Increase workloads as tolerated to help improve cardiorespiratory fitness.          Discharge Exercise Prescription (Final Exercise Prescription Changes): Exercise Prescription Changes - 12/28/18 1449      Response to Exercise   Blood Pressure (Admit)  126/82    Blood Pressure (Exercise)  122/80    Blood Pressure (Exit)  116/80    Heart Rate (Admit)  66 bpm    Heart Rate (Exercise)  83 bpm    Heart Rate (Exit)  63 bpm    Rating of Perceived Exertion (Exercise)  12    Symptoms  none    Duration  Progress to 30 minutes of  aerobic without signs/symptoms of physical distress    Intensity  THRR unchanged      Progression   Progression  Continue to progress workloads to maintain  intensity without signs/symptoms of physical distress.    Average METs  3.2      Resistance Training   Training Prescription  Yes    Weight  4lbs    Reps  10-15    Time  10 Minutes      Interval Training   Interval Training  No      Recumbant Bike   Level  2.5    Watts  50    Minutes  10    METs  3.4      NuStep   Level  3    SPM  85    Minutes  10    METs  2.6      Track   Laps  15    Minutes  10    METs  3.6       Nutrition:  Target Goals: Understanding of nutrition guidelines, daily intake of sodium 1500mg , cholesterol 200mg , calories 30% from fat and 7% or less from saturated fats, daily to have 5 or more servings of fruits and vegetables.  Biometrics: Pre Biometrics - 12/24/18 0943      Pre Biometrics   Height  5\' 11"  (1.803 m)    Weight  98.6 kg    Waist Circumference  42 inches    Hip Circumference  41 inches  Waist to Hip Ratio  1.02 %    BMI (Calculated)  30.33    Triceps Skinfold  32 mm    % Body Fat  32.2 %    Grip Strength  35 kg    Flexibility  7.5 in    Single Leg Stand  30 seconds        Nutrition Therapy Plan and Nutrition Goals: Nutrition Therapy & Goals - 12/24/18 1102      Nutrition Therapy   Diet  heart healthy, carb modified       Personal Nutrition Goals   Nutrition Goal  Pt to identify and limit food sources of saturated fat, trans fat, refined carbohydrates and sodium    Personal Goal #2  Pt able to name foods that affect blood glucose.    Personal Goal #3  Pt to eat a variety of non-starchy vegetables.    Personal Goal #4  Pt to identify food quantities necessary to achieve weight loss of 6-24 lbs. at graduation from cardiac rehab.      Intervention Plan   Intervention  Prescribe, educate and counsel regarding individualized specific dietary modifications aiming towards targeted core components such as weight, hypertension, lipid management, diabetes, heart failure and other comorbidities.    Expected Outcomes  Short Term  Goal: Understand basic principles of dietary content, such as calories, fat, sodium, cholesterol and nutrients.;Long Term Goal: Adherence to prescribed nutrition plan.       Nutrition Assessments: Nutrition Assessments - 12/24/18 1103      MEDFICTS Scores   Pre Score  23       Nutrition Goals Re-Evaluation: Nutrition Goals Re-Evaluation    Row Name 12/24/18 1102             Goals   Current Weight  217 lb 6 oz (98.6 kg)          Nutrition Goals Re-Evaluation: Nutrition Goals Re-Evaluation    Row Name 12/24/18 1102             Goals   Current Weight  217 lb 6 oz (98.6 kg)          Nutrition Goals Discharge (Final Nutrition Goals Re-Evaluation): Nutrition Goals Re-Evaluation - 12/24/18 1102      Goals   Current Weight  217 lb 6 oz (98.6 kg)       Psychosocial: Target Goals: Acknowledge presence or absence of significant depression and/or stress, maximize coping skills, provide positive support system. Participant is able to verbalize types and ability to use techniques and skills needed for reducing stress and depression.  Initial Review & Psychosocial Screening: Initial Psych Review & Screening - 12/24/18 0848      Initial Review   Current issues with  None Identified      Family Dynamics   Good Support System?  Yes   Pt lists his wife, family, and friends as sources of support. David [name]'s wife was present at today's orientation session.      Barriers   Psychosocial barriers to participate in program  There are no identifiable barriers or psychosocial needs.      Screening Interventions   Interventions  Encouraged to exercise       Quality of Life Scores: Quality of Life - 12/24/18 0849      Quality of Life   Select  Quality of Life      Quality of Life Scores   Health/Function Pre  30 %    Socioeconomic Pre  30 %  Psych/Spiritual Pre  30 %    Family Pre  30 %    GLOBAL Pre  30 %      Scores of 19 and below usually indicate a poorer  quality of life in these areas.  A difference of  2-3 points is a clinically meaningful difference.  A difference of 2-3 points in the total score of the Quality of Life Index has been associated with significant improvement in overall quality of life, self-image, physical symptoms, and general health in studies assessing change in quality of life.  PHQ-9: Recent Review Flowsheet Data    Depression screen [facility] 2/9 12/28/2018 11/09/2018   Decreased Interest 0 0   Down, Depressed, Hopeless 0 0   PHQ - 2 Score 0 0     Interpretation of Total Score  Total Score Depression Severity:  1-4 = Minimal depression, 5-9 = Mild depression, 10-14 = Moderate depression, 15-19 = Moderately severe depression, 20-27 = Severe depression   Psychosocial Evaluation and Intervention:   Psychosocial Re-Evaluation: Psychosocial Re-Evaluation    [place] Name 12/31/18 1434             Psychosocial Re-Evaluation   Current issues with  None Identified       Interventions  Encouraged to attend Cardiac Rehabilitation for the exercise       Continue Psychosocial Services   No Follow up required          Psychosocial Discharge (Final Psychosocial Re-Evaluation): Psychosocial Re-Evaluation - 12/31/18 1434      Psychosocial Re-Evaluation   Current issues with  None Identified    Interventions  Encouraged to attend Cardiac Rehabilitation for the exercise    Continue Psychosocial Services   No Follow up required       Vocational Rehabilitation: Provide vocational rehab assistance to qualifying candidates.   Vocational Rehab Evaluation & Intervention: Vocational Rehab - 12/24/18 0939      Initial Vocational Rehab Evaluation & Intervention   Assessment shows need for Vocational Rehabilitation  No       Education: Education Goals: Education classes will be provided on a weekly basis, covering required topics. Participant will state understanding/return demonstration of topics presented.  Learning  Barriers/Preferences: Learning Barriers/Preferences - 12/24/18 5621      Learning Barriers/Preferences   Learning Barriers  Sight    Learning Preferences  Video;Skilled Demonstration;Pictoral       Education Topics: Count Your Pulse:  -Group instruction provided by verbal instruction, demonstration, patient participation and written materials to support subject.  Instructors address importance of being able to find your pulse and how to count your pulse when at home without a heart monitor.  Patients get hands on experience counting their pulse with staff help and individually.   Heart Attack, Angina, and Risk Factor Modification:  -Group instruction provided by verbal instruction, video, and written materials to support subject.  Instructors address signs and symptoms of angina and heart attacks.    Also discuss risk factors for heart disease and how to make changes to improve heart health risk factors.   Functional Fitness:  -Group instruction provided by verbal instruction, demonstration, patient participation, and written materials to support subject.  Instructors address safety measures for doing things around the house.  Discuss how to get up and down off the floor, how to pick things up properly, how to safely get out of a chair without assistance, and balance training.   Meditation and Mindfulness:  -Group instruction provided by verbal instruction, patient participation, and  written materials to support subject.  Instructor addresses importance of mindfulness and meditation practice to help reduce stress and improve awareness.  Instructor also leads participants through a meditation exercise.    Stretching for Flexibility and Mobility:  -Group instruction provided by verbal instruction, patient participation, and written materials to support subject.  Instructors lead participants through series of stretches that are designed to increase flexibility thus improving mobility.  These  stretches are additional exercise for major muscle groups that are typically performed during regular warm up and cool down.   Hands Only CPR:  -Group verbal, video, and participation provides a basic overview of AHA guidelines for community CPR. Role-play of emergencies allow participants the opportunity to practice calling for help and chest compression technique with discussion of AED use.   Hypertension: -Group verbal and written instruction that provides a basic overview of hypertension including the most recent diagnostic guidelines, risk factor reduction with self-care instructions and medication management.    Nutrition I class: Heart Healthy Eating:  -Group instruction provided by PowerPoint slides, verbal discussion, and written materials to support subject matter. The instructor gives an explanation and review of the Therapeutic Lifestyle Changes diet recommendations, which includes a discussion on lipid goals, dietary fat, sodium, fiber, plant stanol/sterol esters, sugar, and the components of a well-balanced, healthy diet.   Nutrition II class: Lifestyle Skills:  -Group instruction provided by PowerPoint slides, verbal discussion, and written materials to support subject matter. The instructor gives an explanation and review of label reading, grocery shopping for heart health, heart healthy recipe modifications, and ways to make healthier choices when eating out.   Diabetes Question & Answer:  -Group instruction provided by PowerPoint slides, verbal discussion, and written materials to support subject matter. The instructor gives an explanation and review of diabetes co-morbidities, pre- and post-prandial blood glucose goals, pre-exercise blood glucose goals, signs, symptoms, and treatment of hypoglycemia and hyperglycemia, and foot care basics.   Diabetes Blitz:  -Group instruction provided by PowerPoint slides, verbal discussion, and written materials to support subject  matter. The instructor gives an explanation and review of the physiology behind type 1 and type 2 diabetes, diabetes medications and rational behind using different medications, pre- and post-prandial blood glucose recommendations and Hemoglobin A1c goals, diabetes diet, and exercise including blood glucose guidelines for exercising safely.    Portion Distortion:  -Group instruction provided by PowerPoint slides, verbal discussion, written materials, and food models to support subject matter. The instructor gives an explanation of serving size versus portion size, changes in portions sizes over the last 20 years, and what consists of a serving from each food group.   Stress Management:  -Group instruction provided by verbal instruction, video, and written materials to support subject matter.  Instructors review role of stress in heart disease and how to cope with stress positively.     Exercising on Your Own:  -Group instruction provided by verbal instruction, power point, and written materials to support subject.  Instructors discuss benefits of exercise, components of exercise, frequency and intensity of exercise, and end points for exercise.  Also discuss use of nitroglycerin and activating EMS.  Review options of places to exercise outside of rehab.  Review guidelines for sex with heart disease.   Cardiac Drugs I:  -Group instruction provided by verbal instruction and written materials to support subject.  Instructor reviews cardiac drug classes: antiplatelets, anticoagulants, beta blockers, and statins.  Instructor discusses reasons, side effects, and lifestyle considerations for each drug class.  CARDIAC REHAB PHASE II EXERCISE from 12/30/2018 in [place]  Date  12/30/18  Instruction Review Code  2- Demonstrated Understanding      Cardiac Drugs II:  -Group instruction provided by verbal instruction and written materials to support subject.  Instructor  reviews cardiac drug classes: angiotensin converting enzyme inhibitors (ACE-I), angiotensin II receptor blockers (ARBs), nitrates, and calcium channel blockers.  Instructor discusses reasons, side effects, and lifestyle considerations for each drug class.   Anatomy and Physiology of the Circulatory System:  Group verbal and written instruction and models provide basic cardiac anatomy and physiology, with the coronary electrical and arterial systems. Review of: AMI, Angina, Valve disease, Heart Failure, Peripheral Artery Disease, Cardiac Arrhythmia, Pacemakers, and the ICD.   Other Education:  -Group or individual verbal, written, or video instructions that support the educational goals of the cardiac rehab program.   Holiday Eating Survival Tips:  -Group instruction provided by PowerPoint slides, verbal discussion, and written materials to support subject matter. The instructor gives patients tips, tricks, and techniques to help them not only survive but enjoy the holidays despite the onslaught of food that accompanies the holidays.   Knowledge Questionnaire Score: Knowledge Questionnaire Score - 12/24/18 0831      Knowledge Questionnaire Score   Pre Score  22/24       Core Components/Risk Factors/Patient Goals at Admission: Personal Goals and Risk Factors at Admission - 12/24/18 0839      Core Components/Risk Factors/Patient Goals on Admission    Weight Management  Yes;Weight Maintenance;Weight Loss    Intervention  Weight Management: Develop a combined nutrition and exercise program designed to reach desired caloric intake, while maintaining appropriate intake of nutrient and fiber, sodium and fats, and appropriate energy expenditure required for the weight goal.;Weight Management: Provide education and appropriate resources to help participant work on and attain dietary goals.;Weight Management/Obesity: Establish reasonable short term and long term weight goals.    Admit Weight  217  lb 6 oz (98.6 kg)    Expected Outcomes  Short Term: Continue to assess and modify interventions until short term weight is achieved;Long Term: Adherence to nutrition and physical activity/exercise program aimed toward attainment of established weight goal;Weight Maintenance: Understanding of the daily nutrition guidelines, which includes 25-35% calories from fat, 7% or less cal from saturated fats, less than 200mg  cholesterol, less than 1.5gm of sodium, & 5 or more servings of fruits and vegetables daily;Weight Loss: Understanding of general recommendations for a balanced deficit meal plan, which promotes 1-2 lb weight loss per week and includes a negative energy balance of [phone] kcal/d;Understanding recommendations for meals to include 15-35% energy as protein, 25-35% energy from fat, 35-60% energy from carbohydrates, less than 200mg  of dietary cholesterol, 20-35 gm of total fiber daily;Understanding of distribution of calorie intake throughout the day with the consumption of 4-5 meals/snacks    Hypertension  Yes    Intervention  Provide education on lifestyle modifcations including regular physical activity/exercise, weight management, moderate sodium restriction and increased consumption of fresh fruit, vegetables, and low fat dairy, alcohol moderation, and smoking cessation.;Monitor prescription use compliance.    Expected Outcomes  Short Term: Continued assessment and intervention until BP is < 140/47mm HG in hypertensive participants. < 130/56mm HG in hypertensive participants with diabetes, heart failure or chronic kidney disease.;Long Term: Maintenance of blood pressure at goal levels.    Lipids  Yes    Intervention  Provide education and support for participant on nutrition & aerobic/resistive exercise  along with prescribed medications to achieve LDL 70mg , HDL >40mg .    Expected Outcomes  Short Term: Participant states understanding of desired cholesterol values and is compliant with medications  prescribed. Participant is following exercise prescription and nutrition guidelines.;Long Term: Cholesterol controlled with medications as prescribed, with individualized exercise RX and with personalized nutrition plan. Value goals: LDL < 70mg , HDL > 40 mg.    Stress  Yes    Intervention  Offer individual and/or small group education and counseling on adjustment to heart disease, stress management and health-related lifestyle change. Teach and support self-help strategies.;Refer participants experiencing significant psychosocial distress to appropriate mental health specialists for further evaluation and treatment. When possible, include family members and significant others in education/counseling sessions.    Expected Outcomes  Short Term: Participant demonstrates changes in health-related behavior, relaxation and other stress management skills, ability to obtain effective social support, and compliance with psychotropic medications if prescribed.;Long Term: Emotional wellbeing is indicated by absence of clinically significant psychosocial distress or social isolation.       Core Components/Risk Factors/Patient Goals Review:  Goals and Risk Factor Review    Row Name 12/31/18 1438             Core Components/Risk Factors/Patient Goals Review   Personal Goals Review  Weight Management/Obesity;Hypertension;Lipids;Stress       Review  David [name] started cardiac rehab this week       Expected Outcomes  David [name] will continue to partcipate in phase 2 cardiac rehab for exercise, nutrition and lifestyle modifications          Core Components/Risk Factors/Patient Goals at Discharge (Final Review):  Goals and Risk Factor Review - 12/31/18 1438      Core Components/Risk Factors/Patient Goals Review   Personal Goals Review  Weight Management/Obesity;Hypertension;Lipids;Stress    Review  David [name] started cardiac rehab this week    Expected Outcomes  David [name] will continue to partcipate in phase 2 cardiac  rehab for exercise, nutrition and lifestyle modifications       ITP Comments: ITP Comments    Row Name 12/24/18 0825 12/31/18 1441         ITP Comments  Dr. Fransico Him, Medical Director  30 Day ITP Review. David [name] started cardiac rehab this week         Comments: See ITP comments.Barnet Pall, RN,BSN 12/31/2018 2:42 PM

## 2019-01-01 ENCOUNTER — Encounter: Payer: Self-pay | Admitting: Cardiovascular Disease

## 2019-01-01 ENCOUNTER — Encounter (HOSPITAL_COMMUNITY): Payer: Medicare Other

## 2019-01-01 ENCOUNTER — Ambulatory Visit: Payer: Medicare Other | Admitting: Cardiovascular Disease

## 2019-01-01 ENCOUNTER — Telehealth (HOSPITAL_COMMUNITY): Payer: Self-pay | Admitting: *Deleted

## 2019-01-01 ENCOUNTER — Ambulatory Visit (HOSPITAL_COMMUNITY): Payer: Medicare Other

## 2019-01-01 VITALS — BP 126/82 | HR 65 | Ht 71.0 in | Wt 219.4 lb

## 2019-01-01 DIAGNOSIS — E669 Obesity, unspecified: Secondary | ICD-10-CM

## 2019-01-01 DIAGNOSIS — I4891 Unspecified atrial fibrillation: Secondary | ICD-10-CM

## 2019-01-01 DIAGNOSIS — I2510 Atherosclerotic heart disease of native coronary artery without angina pectoris: Secondary | ICD-10-CM

## 2019-01-01 DIAGNOSIS — I10 Essential (primary) hypertension: Secondary | ICD-10-CM | POA: Diagnosis not present

## 2019-01-01 DIAGNOSIS — E7800 Pure hypercholesterolemia, unspecified: Secondary | ICD-10-CM

## 2019-01-01 DIAGNOSIS — I9789 Other postprocedural complications and disorders of the circulatory system, not elsewhere classified: Secondary | ICD-10-CM

## 2019-01-01 NOTE — Patient Instructions (Signed)
Medication Instructions:  Dr Sallyanne Kuster recommends that you continue on your current medications as directed. Please refer to the Current Medication list given to you today.  If you need a refill on your cardiac medications before your next appointment, please call your pharmacy.   Follow-Up: At [facility], you and your health needs are our priority.  As part of our continuing mission to provide you with exceptional heart care, we have created designated Provider Care Teams.  These Care Teams include your primary Cardiologist (physician) and Advanced Practice Providers (APPs -  Physician Assistants and Nurse Practitioners) who all work together to provide you with the care you need, when you need it. You will need a follow up appointment in 9 months. You may see Sanda Klein, MD or one of the following Advanced Practice Providers on your designated Care Team: [place], [place] . Fabian Sharp, PA-C . You will receive a reminder letter in the mail two months in advance. If you don't receive a letter, please call our office to schedule the follow-up appointment.

## 2019-01-01 NOTE — Progress Notes (Signed)
Cardiology Office Note:    Date:  01/02/2019   ID:  David [name], DOB 18-Apr-1951, MRN [id number]  PCP:  Orpah Melter, MD  Cardiologist:  Sanda Klein, MD  Electrophysiologist:  None   Referring MD: No ref. provider found   Chief Complaint  Patient presents with  . Coronary Artery Disease    3 months s/p CABG    History of Present Illness:    David [name] is a 68 y.o. male who presented with a small non-STEMI in October 2019, was found to have multivessel coronary artery disease with total occlusion of the right coronary artery and high-grade stenosis of the LAD and large diagonal branch, subsequently undergoing four-vessel bypass surgery (LIMA to LAD, SVG to diagonal, sequential SVG to PLA and PDA, Dr. Prescott Gum, October 06, 2018).  Surgery was complicated by transient postoperative atrial fibrillation.  He took amiodarone briefly, but was never prescribed anticoagulants.  He has preserved left ventricular systolic function, with inferior wall hypokinesis.  He has hypertension, hyperlipidemia and quit smoking many years ago.  He has recovered well from bypass surgery and no longer has any surgical site pain, although he complains of some numbness in the sternal area.  He is going to cardiac rehab but started with some delay.  He was already going to the gym and [place] before these problems began and he would rather exercise closer to home.  He has some swelling particularly in the right leg where the saphenous vein was harvested from, towards the end of the day, always resolving after lying in bed overnight.  The patient specifically denies any chest pain at rest exertion, dyspnea at rest or with exertion, orthopnea, paroxysmal nocturnal dyspnea, syncope, palpitations, focal neurological deficits, intermittent claudication, unexplained weight gain, cough, hemoptysis or wheezing.   Past Medical History:  Diagnosis Date  . Dyslipidemia   . Essential hypertension   . S/P CABG  x 4     Past Surgical History:  Procedure Laterality Date  . CORONARY ARTERY BYPASS GRAFT N/A 10/06/2018   Procedure: CORONARY ARTERY BYPASS GRAFTING (CABG) x four, using left internal mammary artery and right leg greater saphenous vein harvested endoscopically;  Surgeon: Ivin Poot, MD;  Location: [place];  Service: Open Heart Surgery;  Laterality: N/A;  . LEFT HEART CATH AND CORONARY ANGIOGRAPHY N/A 10/02/2018   Procedure: LEFT HEART CATH AND CORONARY ANGIOGRAPHY;  Surgeon: Belva Crome, MD;  Location: [place] [facility];  Service: Cardiovascular;  Laterality: N/A;  . TEE WITHOUT CARDIOVERSION N/A 10/06/2018   Procedure: TRANSESOPHAGEAL ECHOCARDIOGRAM (TEE);  Surgeon: Prescott Gum, Collier Salina, MD;  Location: [place];  Service: Open Heart Surgery;  Laterality: N/A;    Current Medications: Current Meds  Medication Sig  . amLODipine (NORVASC) 5 MG tablet Take 5 mg by mouth daily.   Marland Kitchen aspirin EC 325 MG EC tablet Take 1 tablet (325 mg total) by mouth daily, THEN 1 tablet (325 mg total) daily.  Marland Kitchen atorvastatin (LIPITOR) 80 MG tablet Take 1 tablet (80 mg total) by mouth daily at 6 PM.  . metoprolol tartrate (LOPRESSOR) 25 MG tablet Take 1 tablet (25 mg total) by mouth 2 (two) times daily.  . tamsulosin (FLOMAX) 0.4 MG CAPS capsule Take 0.4 mg by mouth daily.     Allergies:   Patient has no known allergies.   Social History   Socioeconomic History  . Marital status: Married    Spouse name: Not on file  . Number of children: Not on file  .  Years of education: Not on file  . Highest education level: Not on file  Occupational History  . Not on file  Social Needs  . Financial resource strain: Not on file  . Food insecurity:    Worry: Not on file    Inability: Not on file  . Transportation needs:    Medical: Not on file    Non-medical: Not on file  Tobacco Use  . Smoking status: Never Smoker  . Smokeless tobacco: Never Used  Substance and Sexual Activity  . Alcohol use: Not on file  .  Drug use: Not on file  . Sexual activity: Not on file  Lifestyle  . Physical activity:    Days per week: Not on file    Minutes per session: Not on file  . Stress: Not on file  Relationships  . Social connections:    Talks on phone: Not on file    Gets together: Not on file    Attends religious service: Not on file    Active member of club or organization: Not on file    Attends meetings of clubs or organizations: Not on file    Relationship status: Not on file  Other Topics Concern  . Not on file  Social History Narrative  . Not on file     Family History: The patient's family history includes Heart attack (age of onset: 71) in his brother; Heart attack (age of onset: 30) in his mother.  ROS:   Please see the history of present illness.     All other systems reviewed and are negative.  EKGs/Labs/Other Studies Reviewed:    The following studies were reviewed today: Coronary angiogram  EKG:  EKG is not ordered today.  The ekg ordered 10/27/2018 demonstrates sinus rhythm with first-degree AV block and minor anteroseptal T wave inversion, unchanged  Recent Labs: 10/04/2018: TSH 7.170 10/07/2018: Magnesium 2.4 10/26/2018: BUN 16; Creatinine, Ser 1.21; Hemoglobin 11.2; Platelets 297; Potassium 5.0; Sodium 144 12/21/2018: ALT 17  Recent Lipid Panel    Component Value Date/Time   CHOL 109 12/21/2018 0810   TRIG 61 12/21/2018 0810   HDL 38 (L) 12/21/2018 0810   CHOLHDL 2.9 12/21/2018 0810   CHOLHDL 4.1 10/02/2018 0830   VLDL 15 10/02/2018 0830   LDLCALC 59 12/21/2018 0810    Physical Exam:    VS:  BP 126/82   Pulse 65   Ht 5\' 11"  (1.803 m)   Wt 219 lb 6.4 oz (99.5 kg)   BMI 30.60 kg/m     Wt Readings from Last 3 Encounters:  01/01/19 219 lb 6.4 oz (99.5 kg)  12/30/18 214 lb 15.2 oz (97.5 kg)  12/24/18 217 lb 6 oz (98.6 kg)     GEN:  Well nourished, well developed in no acute distress HEENT: Normal NECK: No JVD; No carotid bruits LYMPHATICS: No  lymphadenopathy CARDIAC: Well-healed sternotomy, RRR, no murmurs, rubs, gallops RESPIRATORY:  Clear to auscultation without rales, wheezing or rhonchi  ABDOMEN: Soft, non-tender, non-distended MUSCULOSKELETAL:  No edema; No deformity  SKIN: Warm and dry NEUROLOGIC:  Alert and oriented x 3 PSYCHIATRIC:  Normal affect   ASSESSMENT:    1. Coronary artery disease involving native coronary artery of native heart without angina pectoris   2. Postoperative atrial fibrillation (HCC)   3. Hypercholesterolemia   4. Essential hypertension   5. Mild obesity    PLAN:    In order of problems listed above:  1. CAD s/p CABG: Asymptomatic, excellent progress postop.  Some mild lower extremity venous insufficiency due to saphenectomy.  The drive to cardiac rehab takes him almost an hour.  Explained the benefits of cardiac rehab beyond simple supervised exercise, including education sessions, but I agree he will probably do just as well going to a gym close to home.  Can reduce aspirin to 81 mg daily. 2. AFib: Brief postoperative A. fib, resolved, no recurrence despite stopping antiarrhythmics months ago. 3. HLP: at target LDL cholesterol less than 70, on high-dose atorvastatin 80 mg.  Lipid-lowering therapy will be a lifelong part of his treatment.  Reviewed the importance of weight loss, healthy diet, regular exercise.  Target BMI under 25, waistline 34 inches or less. 4. HTN: Excellent control.  Amlodipine may be contributing to his swelling.  If he loses weight I am sure we will be able to decrease or even stop it.   Medication Adjustments/Labs and Tests Ordered: Current medicines are reviewed at length with the patient today.  Concerns regarding medicines are outlined above.  No orders of the defined types were placed in this encounter.  No orders of the defined types were placed in this encounter.   Patient Instructions  Medication Instructions:  Dr Sallyanne Kuster recommends that you continue on  your current medications as directed. Please refer to the Current Medication list given to you today.  If you need a refill on your cardiac medications before your next appointment, please call your pharmacy.   Follow-Up: At [facility], you and your health needs are our priority.  As part of our continuing mission to provide you with exceptional heart care, we have created designated Provider Care Teams.  These Care Teams include your primary Cardiologist (physician) and Advanced Practice Providers (APPs -  Physician Assistants and Nurse Practitioners) who all work together to provide you with the care you need, when you need it. You will need a follow up appointment in 9 months. You may see Sanda Klein, MD or one of the following Advanced Practice Providers on your designated Care Team: [place], [place] . Fabian Sharp, PA-C . You will receive a reminder letter in the mail two months in advance. If you don't receive a letter, please call our office to schedule the follow-up appointment.    Signed, Sanda Klein, MD  01/02/2019 12:04 PM    [place]

## 2019-01-02 ENCOUNTER — Encounter: Payer: Self-pay | Admitting: Cardiovascular Disease

## 2019-01-03 ENCOUNTER — Other Ambulatory Visit: Payer: Self-pay | Admitting: Cardiovascular Disease

## 2019-01-04 ENCOUNTER — Encounter (HOSPITAL_COMMUNITY): Payer: Medicare Other

## 2019-01-04 ENCOUNTER — Ambulatory Visit (HOSPITAL_COMMUNITY): Payer: Medicare Other

## 2019-01-06 ENCOUNTER — Ambulatory Visit (HOSPITAL_COMMUNITY): Payer: Medicare Other

## 2019-01-06 ENCOUNTER — Encounter (HOSPITAL_COMMUNITY): Payer: Medicare Other

## 2019-01-08 ENCOUNTER — Encounter (HOSPITAL_COMMUNITY): Payer: Medicare Other

## 2019-01-08 ENCOUNTER — Ambulatory Visit (HOSPITAL_COMMUNITY): Payer: Medicare Other

## 2019-01-11 ENCOUNTER — Ambulatory Visit (HOSPITAL_COMMUNITY): Payer: Medicare Other

## 2019-01-11 ENCOUNTER — Encounter (HOSPITAL_COMMUNITY): Payer: Medicare Other

## 2019-01-13 ENCOUNTER — Ambulatory Visit (HOSPITAL_COMMUNITY): Payer: Medicare Other

## 2019-01-13 ENCOUNTER — Encounter (HOSPITAL_COMMUNITY): Payer: Medicare Other

## 2019-01-15 ENCOUNTER — Ambulatory Visit (HOSPITAL_COMMUNITY): Payer: Medicare Other

## 2019-01-15 ENCOUNTER — Encounter (HOSPITAL_COMMUNITY): Payer: Medicare Other

## 2019-01-18 ENCOUNTER — Encounter (HOSPITAL_COMMUNITY): Payer: Medicare Other

## 2019-01-18 ENCOUNTER — Ambulatory Visit (HOSPITAL_COMMUNITY): Payer: Medicare Other

## 2019-01-20 ENCOUNTER — Encounter (HOSPITAL_COMMUNITY): Payer: Medicare Other

## 2019-01-20 ENCOUNTER — Ambulatory Visit (HOSPITAL_COMMUNITY): Payer: Medicare Other

## 2019-01-20 NOTE — Addendum Note (Signed)
Encounter addended by: Ivonne Andrew, RD on: 01/20/2019 11:13 AM  Actions taken: Flowsheet data copied forward, Visit Navigator Flowsheet section accepted

## 2019-01-22 ENCOUNTER — Encounter (HOSPITAL_COMMUNITY): Payer: Medicare Other

## 2019-01-22 ENCOUNTER — Ambulatory Visit (HOSPITAL_COMMUNITY): Payer: Medicare Other

## 2019-01-25 ENCOUNTER — Encounter (HOSPITAL_COMMUNITY): Payer: Medicare Other

## 2019-01-25 ENCOUNTER — Ambulatory Visit (HOSPITAL_COMMUNITY): Payer: Medicare Other

## 2019-01-27 ENCOUNTER — Encounter (HOSPITAL_COMMUNITY): Payer: Medicare Other

## 2019-01-27 ENCOUNTER — Ambulatory Visit (HOSPITAL_COMMUNITY): Payer: Medicare Other

## 2019-01-29 ENCOUNTER — Encounter (HOSPITAL_COMMUNITY): Payer: Medicare Other

## 2019-01-29 ENCOUNTER — Ambulatory Visit (HOSPITAL_COMMUNITY): Payer: Medicare Other

## 2019-02-01 ENCOUNTER — Ambulatory Visit (HOSPITAL_COMMUNITY): Payer: Medicare Other

## 2019-02-01 ENCOUNTER — Encounter (HOSPITAL_COMMUNITY): Payer: Medicare Other

## 2019-02-03 ENCOUNTER — Ambulatory Visit (HOSPITAL_COMMUNITY): Payer: Medicare Other

## 2019-02-03 ENCOUNTER — Encounter (HOSPITAL_COMMUNITY): Payer: Medicare Other

## 2019-02-05 ENCOUNTER — Ambulatory Visit (HOSPITAL_COMMUNITY): Payer: Medicare Other

## 2019-02-05 ENCOUNTER — Encounter (HOSPITAL_COMMUNITY): Payer: Medicare Other

## 2019-02-08 ENCOUNTER — Encounter (HOSPITAL_COMMUNITY): Payer: Medicare Other

## 2019-02-08 ENCOUNTER — Ambulatory Visit (HOSPITAL_COMMUNITY): Payer: Medicare Other

## 2019-02-10 ENCOUNTER — Encounter (HOSPITAL_COMMUNITY): Payer: Medicare Other

## 2019-02-10 ENCOUNTER — Ambulatory Visit (HOSPITAL_COMMUNITY): Payer: Medicare Other

## 2019-02-12 ENCOUNTER — Ambulatory Visit (HOSPITAL_COMMUNITY): Payer: Medicare Other

## 2019-02-12 ENCOUNTER — Encounter (HOSPITAL_COMMUNITY): Payer: Medicare Other

## 2019-02-15 ENCOUNTER — Encounter (HOSPITAL_COMMUNITY): Payer: Medicare Other

## 2019-02-15 ENCOUNTER — Ambulatory Visit (HOSPITAL_COMMUNITY): Payer: Medicare Other

## 2019-02-17 ENCOUNTER — Telehealth: Payer: Self-pay | Admitting: *Deleted

## 2019-02-17 ENCOUNTER — Ambulatory Visit (HOSPITAL_COMMUNITY): Payer: Medicare Other

## 2019-02-17 ENCOUNTER — Encounter (HOSPITAL_COMMUNITY): Payer: Medicare Other

## 2019-02-17 NOTE — Telephone Encounter (Signed)
   Primary Cardiologist: Sanda Klein, MD  Chart reviewed as part of pre-operative protocol coverage. Given past medical history and time since last visit, based on ACC/AHA guidelines, Kervin Bones would be at acceptable risk for the planned procedure without further cardiovascular testing.   Continue ASA. No need to hold for dental cleaning.   I will route this recommendation to the requesting party via Epic fax function and remove from pre-op pool.  Please call with questions.  Lyda Jester, PA-C 02/17/2019, 2:30 PM

## 2019-02-17 NOTE — Telephone Encounter (Signed)
   [facility] Pre-operative Risk Assessment    Request for surgical clearance:  1. What type of surgery is being performed?  TEETH CLEANING ONLY   2. When is this surgery scheduled? 03/02/19   3. What type of clearance is required (medical clearance vs. Pharmacy clearance to hold med vs. Both)? BOTH   4. Are there any medications that need to be held prior to surgery and how long? ASA ?  5. Practice name and name of physician performing surgery? [facility]   6. What is your office phone number [phone]    7.   What is your office fax number [phone]  2.   Anesthesia type (None, local, MAC, general) ? NONE   Devra Dopp 02/17/2019, 2:05 PM  _________________________________________________________________   (provider comments below)

## 2019-02-18 ENCOUNTER — Telehealth: Payer: Self-pay | Admitting: Cardiovascular Disease

## 2019-02-18 NOTE — Telephone Encounter (Signed)
New Message:    Please call, she needs the correct directions for pt's Metoprolol please.

## 2019-02-18 NOTE — Telephone Encounter (Signed)
Lm to call back ./cy 

## 2019-02-19 ENCOUNTER — Encounter (HOSPITAL_COMMUNITY): Payer: Medicare Other

## 2019-02-19 ENCOUNTER — Ambulatory Visit (HOSPITAL_COMMUNITY): Payer: Medicare Other

## 2019-02-19 ENCOUNTER — Other Ambulatory Visit: Payer: Self-pay | Admitting: *Deleted

## 2019-02-19 MED ORDER — METOPROLOL TARTRATE 25 MG PO TABS: 25.0000 mg | ORAL_TABLET | Freq: Two times a day (BID) | ORAL | 1 refills | Status: DC

## 2019-02-19 NOTE — Telephone Encounter (Signed)
Hao increased it on 10/26/2018. Please fill in new Rx with higher dose and correct in Epic. MCr

## 2019-02-19 NOTE — Telephone Encounter (Signed)
Noted. Will send in new dose. Attempted to contact patient and advise that medication increase was sent. Patient did not answer, voicemail was full unable to leave message.

## 2019-02-19 NOTE — Telephone Encounter (Signed)
Patient came into the office today stating the his metoprolol dose was sent in as a half a tablet and Dr.C changed him to a whole tablet. I looked in note and did not see anything about increasing this dose. Advised patient that I would route to Dr.C and have him advice.  Thank you!

## 2019-02-19 NOTE — Telephone Encounter (Signed)
Mailbox full and cannot accept messages. LM on home number.

## 2019-02-22 ENCOUNTER — Ambulatory Visit (HOSPITAL_COMMUNITY): Payer: Medicare Other

## 2019-02-22 ENCOUNTER — Encounter (HOSPITAL_COMMUNITY): Payer: Medicare Other

## 2019-02-24 ENCOUNTER — Ambulatory Visit (HOSPITAL_COMMUNITY): Payer: Medicare Other

## 2019-02-24 ENCOUNTER — Encounter (HOSPITAL_COMMUNITY): Payer: Medicare Other

## 2019-02-24 MED ORDER — METOPROLOL TARTRATE 25 MG PO TABS: 25.0000 mg | ORAL_TABLET | Freq: Two times a day (BID) | ORAL | 3 refills | Status: DC

## 2019-02-24 NOTE — Addendum Note (Signed)
Addended by: Cristopher Estimable on: 02/24/2019 12:32 PM   Modules accepted: Orders

## 2019-02-24 NOTE — Telephone Encounter (Signed)
Patient with recent hx of CAD s/p CABG 09/2018. No hx of valvular heart disease. He does note need any premedications for dental procedure.

## 2019-02-24 NOTE — Telephone Encounter (Addendum)
Spoke with pt wife, aware the metoprolol is 25 mg one tablet twice daily.

## 2019-02-24 NOTE — Telephone Encounter (Signed)
Follow up   Patients wife is calling on his behalf just to confirm the medication that the patient should be taken.

## 2019-02-24 NOTE — Telephone Encounter (Signed)
Received a fax from [facility], the patient has informed them that he has recently had a cardiac procedure and they are asking if there needs to be a waiting period before any dental procedures and if the patient would need pre-med. Will forward back to the pre-op pool.

## 2019-02-26 ENCOUNTER — Encounter (HOSPITAL_COMMUNITY): Payer: Medicare Other

## 2019-02-26 ENCOUNTER — Ambulatory Visit (HOSPITAL_COMMUNITY): Payer: Medicare Other

## 2019-03-01 ENCOUNTER — Encounter (HOSPITAL_COMMUNITY): Payer: Medicare Other

## 2019-03-01 ENCOUNTER — Ambulatory Visit (HOSPITAL_COMMUNITY): Payer: Medicare Other

## 2019-03-03 ENCOUNTER — Encounter (HOSPITAL_COMMUNITY): Payer: Medicare Other

## 2019-03-03 ENCOUNTER — Ambulatory Visit (HOSPITAL_COMMUNITY): Payer: Medicare Other

## 2019-03-04 ENCOUNTER — Other Ambulatory Visit: Payer: Self-pay

## 2019-03-04 MED ORDER — TAMSULOSIN HCL 0.4 MG PO CAPS: 0.4000 mg | ORAL_CAPSULE | Freq: Every day | ORAL | 0 refills | Status: DC

## 2019-03-05 ENCOUNTER — Encounter (HOSPITAL_COMMUNITY): Payer: Medicare Other

## 2019-03-05 ENCOUNTER — Ambulatory Visit (HOSPITAL_COMMUNITY): Payer: Medicare Other

## 2019-03-05 NOTE — Addendum Note (Signed)
Addended by: Kathyrn Lass on: 03/05/2019 01:29 PM   Modules accepted: Orders

## 2019-03-08 ENCOUNTER — Ambulatory Visit (HOSPITAL_COMMUNITY): Payer: Medicare Other

## 2019-03-08 ENCOUNTER — Encounter (HOSPITAL_COMMUNITY): Payer: Medicare Other

## 2019-03-08 MED ORDER — TAMSULOSIN HCL 0.4 MG PO CAPS: 0.4000 mg | ORAL_CAPSULE | Freq: Every day | ORAL | 0 refills | Status: DC

## 2019-03-08 NOTE — Addendum Note (Signed)
Addended by: Kathyrn Lass on: 03/08/2019 06:37 PM   Modules accepted: Orders

## 2019-03-10 ENCOUNTER — Encounter (HOSPITAL_COMMUNITY): Payer: Medicare Other

## 2019-03-10 ENCOUNTER — Ambulatory Visit (HOSPITAL_COMMUNITY): Payer: Medicare Other

## 2019-03-12 ENCOUNTER — Encounter (HOSPITAL_COMMUNITY): Payer: Medicare Other

## 2019-03-12 ENCOUNTER — Ambulatory Visit (HOSPITAL_COMMUNITY): Payer: Medicare Other

## 2019-03-15 ENCOUNTER — Encounter (HOSPITAL_COMMUNITY): Payer: Medicare Other

## 2019-03-15 ENCOUNTER — Ambulatory Visit (HOSPITAL_COMMUNITY): Payer: Medicare Other

## 2019-03-17 ENCOUNTER — Ambulatory Visit (HOSPITAL_COMMUNITY): Payer: Medicare Other

## 2019-03-17 ENCOUNTER — Encounter (HOSPITAL_COMMUNITY): Payer: Medicare Other

## 2019-03-19 ENCOUNTER — Ambulatory Visit (HOSPITAL_COMMUNITY): Payer: Medicare Other

## 2019-03-19 ENCOUNTER — Encounter (HOSPITAL_COMMUNITY): Payer: Medicare Other

## 2019-03-22 ENCOUNTER — Ambulatory Visit (HOSPITAL_COMMUNITY): Payer: Medicare Other

## 2019-03-22 ENCOUNTER — Encounter (HOSPITAL_COMMUNITY): Payer: Medicare Other

## 2019-03-24 ENCOUNTER — Ambulatory Visit (HOSPITAL_COMMUNITY): Payer: Medicare Other

## 2019-03-24 ENCOUNTER — Encounter (HOSPITAL_COMMUNITY): Payer: Medicare Other

## 2019-03-26 ENCOUNTER — Ambulatory Visit (HOSPITAL_COMMUNITY): Payer: Medicare Other

## 2019-03-26 ENCOUNTER — Encounter (HOSPITAL_COMMUNITY): Payer: Medicare Other

## 2019-03-29 ENCOUNTER — Ambulatory Visit (HOSPITAL_COMMUNITY): Payer: Medicare Other

## 2019-03-29 ENCOUNTER — Encounter (HOSPITAL_COMMUNITY): Payer: Medicare Other

## 2019-03-31 ENCOUNTER — Ambulatory Visit (HOSPITAL_COMMUNITY): Payer: Medicare Other

## 2019-03-31 ENCOUNTER — Encounter (HOSPITAL_COMMUNITY): Payer: Medicare Other

## 2019-05-03 NOTE — Telephone Encounter (Signed)
Per Dr. Loletha Grayer, pt added to schedule for virtual visit tomorrow 5/12 at 1 pm.   Virtual Visit Pre-Appointment Phone Call  "(Name), I am calling you today to discuss your upcoming appointment. We are currently trying to limit exposure to the virus that causes COVID-19 by seeing patients at home rather than in the office."  1. "What is the BEST phone number to call the day of the visit?" - include this in appointment notes  2. "Do you have or have access to (through a family member/friend) a smartphone with video capability that we can use for your visit?" a. If yes - list this number in appt notes as "cell" (if different from BEST phone #) and list the appointment type as a VIDEO visit in appointment notes b. If no - list the appointment type as a PHONE visit in appointment notes  3. Confirm consent - "In the setting of the current Covid19 crisis, you are scheduled for a (phone or video) visit with your provider on (date) at (time).  Just as we do with many in-office visits, in order for you to participate in this visit, we must obtain consent.  If you'd like, I can send this to your mychart (if signed up) or email for you to review.  Otherwise, I can obtain your verbal consent now.  All virtual visits are billed to your insurance company just like a normal visit would be.  By agreeing to a virtual visit, we'd like you to understand that the technology does not allow for your provider to perform an examination, and thus may limit your provider's ability to fully assess your condition. If your provider identifies any concerns that need to be evaluated in person, we will make arrangements to do so.  Finally, though the technology is pretty good, we cannot assure that it will always work on either your or our end, and in the setting of a video visit, we may have to convert it to a phone-only visit.  In either situation, we cannot ensure that we have a secure connection.  Are you willing to proceed?" STAFF: Did  the patient verbally acknowledge consent to telehealth visit? Document YES/NO here: Yes  4. Advise patient to be prepared - "Two hours prior to your appointment, go ahead and check your blood pressure, pulse, oxygen saturation, and your weight (if you have the equipment to check those) and write them all down. When your visit starts, your provider will ask you for this information. If you have an Apple Watch or Kardia device, please plan to have heart rate information ready on the day of your appointment. Please have a pen and paper handy nearby the day of the visit as well."  5. Give patient instructions for MyChart download to smartphone OR Doximity/Doxy.me as below if video visit (depending on what platform provider is using)  6. Inform patient they will receive a phone call 15 minutes prior to their appointment time (may be from unknown caller ID) so they should be prepared to answer    TELEPHONE CALL NOTE  David [name] has been deemed a candidate for a follow-up tele-health visit to limit community exposure during the Covid-19 pandemic. I spoke with the patient via phone to ensure availability of phone/video source, confirm preferred email & phone number, and discuss instructions and expectations.  I reminded David [name] to be prepared with any vital sign and/or heart rhythm information that could potentially be obtained via home monitoring, at the time of his  visit. I reminded David [name] to expect a phone call prior to his visit.  David Crutch, RN 05/03/2019 5:33 PM   INSTRUCTIONS FOR DOWNLOADING THE MYCHART APP TO SMARTPHONE  - The patient must first make sure to have activated MyChart and know their login information - If Apple, go to [employer] and type in MyChart in the search bar and download the app. If Android, ask patient to go to [employer] and type in [place] in the search bar and download the app. The app is free but as with any other app downloads, their phone may  require them to verify saved payment information or Apple/Android password.  - The patient will need to then log into the app with their MyChart username and password, and select [facility] as their healthcare provider to link the account. When it is time for your visit, go to the MyChart app, find appointments, and click Begin Video Visit. Be sure to Select Allow for your device to access the Microphone and Camera for your visit. You will then be connected, and your provider will be with you shortly.  **If they have any issues connecting, or need assistance please contact MyChart service desk [phone])**  **If using a computer, in order to ensure the best quality for their visit they will need to use either of the following Internet Browsers: [employer], or Google Chrome**  IF USING DOXIMITY or DOXY.ME - The patient will receive a link just prior to their visit by text.     FULL LENGTH CONSENT FOR TELE-HEALTH VISIT   I hereby voluntarily request, consent and authorize [facility] and its employed or contracted physicians, physician assistants, nurse practitioners or other licensed health care professionals (the Practitioner), to provide me with telemedicine health care services (the "Services") as deemed necessary by the treating Practitioner. I acknowledge and consent to receive the Services by the Practitioner via telemedicine. I understand that the telemedicine visit will involve communicating with the Practitioner through live audiovisual communication technology and the disclosure of certain medical information by electronic transmission. I acknowledge that I have been given the opportunity to request an in-person assessment or other available alternative prior to the telemedicine visit and am voluntarily participating in the telemedicine visit.  I understand that I have the right to withhold or withdraw my consent to the use of telemedicine in the course of my care at  any time, without affecting my right to future care or treatment, and that the Practitioner or I may terminate the telemedicine visit at any time. I understand that I have the right to inspect all information obtained and/or recorded in the course of the telemedicine visit and may receive copies of available information for a reasonable fee.  I understand that some of the potential risks of receiving the Services via telemedicine include:  Marland Kitchen Delay or interruption in medical evaluation due to technological equipment failure or disruption; . Information transmitted may not be sufficient (e.g. poor resolution of images) to allow for appropriate medical decision making by the Practitioner; and/or  . In rare instances, security protocols could fail, causing a breach of personal health information.  Furthermore, I acknowledge that it is my responsibility to provide information about my medical history, conditions and care that is complete and accurate to the best of my ability. I acknowledge that Practitioner's advice, recommendations, and/or decision may be based on factors not within their control, such as incomplete or inaccurate data provided by me or  distortions of diagnostic images or specimens that may result from electronic transmissions. I understand that the practice of medicine is not an exact science and that Practitioner makes no warranties or guarantees regarding treatment outcomes. I acknowledge that I will receive a copy of this consent concurrently upon execution via email to the email address I last provided but may also request a printed copy by calling the office of [facility].    I understand that my insurance will be billed for this visit.   I have read or had this consent read to me. . I understand the contents of this consent, which adequately explains the benefits and risks of the Services being provided via telemedicine.  . I have been provided ample opportunity to ask questions  regarding this consent and the Services and have had my questions answered to my satisfaction. . I give my informed consent for the services to be provided through the use of telemedicine in my medical care  By participating in this telemedicine visit I agree to the above.

## 2019-05-03 NOTE — Telephone Encounter (Signed)
Attempted to contact pt via phone in regards to his mychart message. Left message to call our office to discuss.

## 2019-05-03 NOTE — Telephone Encounter (Addendum)
Spoke with pt who states, for the past 2 months he's noticed when he do any type of strenous activities like walking up inclines or trimming hedges, he experiences some left sided tightness in chest and shoulder blade. He report symptoms are usually relieved with rest and denies any SOB or other symptoms.Pt also denies any chest pain at the moment.  Advised pt, Nurse will notify MD and will contact him with recommendations. Pt voiced understanding.

## 2019-05-04 ENCOUNTER — Telehealth (INDEPENDENT_AMBULATORY_CARE_PROVIDER_SITE_OTHER): Payer: Medicare Other | Admitting: Cardiovascular Disease

## 2019-05-04 ENCOUNTER — Encounter: Payer: Self-pay | Admitting: Cardiovascular Disease

## 2019-05-04 VITALS — BP 132/85 | HR 52 | Ht 70.0 in | Wt 208.0 lb

## 2019-05-04 DIAGNOSIS — Z951 Presence of aortocoronary bypass graft: Secondary | ICD-10-CM | POA: Diagnosis not present

## 2019-05-04 DIAGNOSIS — E785 Hyperlipidemia, unspecified: Secondary | ICD-10-CM

## 2019-05-04 DIAGNOSIS — I10 Essential (primary) hypertension: Secondary | ICD-10-CM

## 2019-05-04 DIAGNOSIS — I25118 Atherosclerotic heart disease of native coronary artery with other forms of angina pectoris: Secondary | ICD-10-CM | POA: Diagnosis not present

## 2019-05-04 DIAGNOSIS — E663 Overweight: Secondary | ICD-10-CM

## 2019-05-04 MED ORDER — ISOSORBIDE MONONITRATE ER 30 MG PO TB24: 30.0000 mg | ORAL_TABLET | Freq: Every morning | ORAL | 11 refills | Status: DC

## 2019-05-04 MED ORDER — NITROGLYCERIN 0.4 MG SL SUBL: 0.4000 mg | SUBLINGUAL_TABLET | SUBLINGUAL | 3 refills | Status: DC | PRN

## 2019-05-04 NOTE — Patient Instructions (Signed)
Medication Instructions:  START Isosorbide 30 mg daily in the morning.  START Nitroglycerin 0.4 mg--take every 5 minutes as needed for chest pain (Maximum 3 doses)  If you need a refill on your cardiac medications before your next appointment, please call your pharmacy.   Follow-Up: At [facility], you and your health needs are our priority.  As part of our continuing mission to provide you with exceptional heart care, we have created designated Provider Care Teams.  These Care Teams include your primary Cardiologist (physician) and Advanced Practice Providers (APPs -  Physician Assistants and Nurse Practitioners) who all work together to provide you with the care you need, when you need it. . You have been scheduled for a virtual follow-up appointment with Almyra Deforest, PA on Monday, 06/21/19 at 2:00 PM.  Any Other Special Instructions Will Be Listed Below (If Applicable). None

## 2019-05-04 NOTE — Progress Notes (Signed)
Virtual Visit via Video Note   This visit type was conducted due to national recommendations for restrictions regarding the COVID-19 Pandemic (e.g. social distancing) in an effort to limit this patient's exposure and mitigate transmission in our community.  Due to his co-morbid illnesses, this patient is at least at moderate risk for complications without adequate follow up.  This format is felt to be most appropriate for this patient at this time.  All issues noted in this document were discussed and addressed.  A limited physical exam was performed with this format.  Please refer to the patient's chart for his consent to telehealth for [name].   Date:  05/04/2019   ID:  David [name], DOB [birth year], MRN [id number]  Patient Location: Home Provider Location: Home  PCP:  Orpah Melter, MD  Cardiologist:  Sanda Klein, MD  Electrophysiologist:  None   Evaluation Performed:  Follow-Up Visit  Chief Complaint: Exertional chest discomfort  History of Present Illness:    David [name] is a 68 y.o. male with coronary artery disease presenting with a small non-ST segment elevation myocardial infarction October 2019 (peak troponin 0.23), found to have a chronic total occlusion of the right coronary artery and high-grade ulcerated 99% stenosis in the mid LAD artery with involvement of the second diagonal branch.  He underwent CABG x4 (Dr. Darcey Nora, LIMA to LAD, SVG to diagonal, sequential SVG to PDA and PLV).  He completed cardiac rehab without problems.  Over the last couple of months he has become more physically active and has noticed chest discomfort exclusively with exertion.  This happens when he is walking uphill in his neighborhood.  It does also happen when he was mowing the lawn with a push mower.  He describes it as a "muscle pull" sensation in the central left-sided part of his chest radiating through to his back.  With resting it resolves within 1 or 2 minutes.  It is not  associated with shortness of breath, diaphoresis or any other symptoms.  He denies syncope, palpitations, dizziness, leg edema, exertional dyspnea, orthopnea or PND or intermittent claudication.  He does not take medications for erectile dysfunction.  The patient does not have symptoms concerning for COVID-19 infection (fever, chills, cough, or new shortness of breath).    Past Medical History:  Diagnosis Date  . Dyslipidemia   . Essential hypertension   . S/P CABG x 4    Past Surgical History:  Procedure Laterality Date  . CORONARY ARTERY BYPASS GRAFT N/A 10/06/2018   Procedure: CORONARY ARTERY BYPASS GRAFTING (CABG) x four, using left internal mammary artery and right leg greater saphenous vein harvested endoscopically;  Surgeon: Ivin Poot, MD;  Location: [facility];  Service: Open Heart Surgery;  Laterality: N/A;  . LEFT HEART CATH AND CORONARY ANGIOGRAPHY N/A 10/02/2018   Procedure: LEFT HEART CATH AND CORONARY ANGIOGRAPHY;  Surgeon: Belva Crome, MD;  Location: [facility];  Service: Cardiovascular;  Laterality: N/A;  . TEE WITHOUT CARDIOVERSION N/A 10/06/2018   Procedure: TRANSESOPHAGEAL ECHOCARDIOGRAM (TEE);  Surgeon: Prescott Gum, Collier Salina, MD;  Location: [facility];  Service: Open Heart Surgery;  Laterality: N/A;     Current Meds  Medication Sig  . amLODipine (NORVASC) 5 MG tablet Take 5 mg by mouth daily.   Marland Kitchen aspirin EC 325 MG EC tablet Take 1 tablet (325 mg total) by mouth daily, THEN 1 tablet (325 mg total) daily.  Marland Kitchen atorvastatin (LIPITOR) 80 MG tablet Take by mouth daily. Pt takes 36  mg two times a day  . metoprolol tartrate (LOPRESSOR) 25 MG tablet Take 1 tablet (25 mg total) by mouth 2 (two) times daily.  . tamsulosin (FLOMAX) 0.4 MG CAPS capsule Take 0.4 mg by mouth 2 (two) times a day.     Allergies:   Patient has no known allergies.   Social History   Tobacco Use  . Smoking status: Never Smoker  . Smokeless tobacco: Never Used  Substance Use Topics  . Alcohol  use: Not on file  . Drug use: Not on file     Family Hx: The patient's family history includes Heart attack (age of onset: 52) in his brother; Heart attack (age of onset: 38) in his mother.  ROS:   Please see the history of present illness.     All other systems reviewed and are negative.   Prior CV studies:   The following studies were reviewed today:  10/02/2018 CATH  Severe two-vessel coronary disease with mid LAD 99% stenosis within a calcified segment.  The second diagonal contains 90% stenosis.  The diagonal is large.  Circumflex artery is normal  The RCA is large giving origin to PDA and to left ventricular branches.  It is totally occluded in the mid vessel and also distally beyond the PDA.  Both the PDA and left ventricular branches fill by right to right and left to right collaterals.    The circumflex coronary artery free of obstructive disease.  Left ventricular function overall appears normal.  EF is at least 50%.  LVEDP is normal.  RECOMMENDATIONS:   Unlikely that RCA can be completely revascularized with CTO technique given the distal total occlusion beyond the PDA.  Therefore with severe LAD disease and significant diagonal obstruction, coronary surgical revascularization has been recommended.  Would anticipate LIMA to LAD, SVG to the diagonal branch, and SVG to PDA and LV branch.  An alternative strategy if needed could be culprit LAD and diagonal PCI.  Medical therapy for the chronically occluded RCA.  Dominance: Right   OPERATIVE REPORT DATE OF PROCEDURE:  10/06/2018  OPERATION: 1.  Coronary artery bypass grafting x4 (left internal mammary artery to left anterior descending, saphenous vein graft to diagonal, sequential saphenous vein graft to posterior descending and posterolateral branch of the right coronary). 2.  Endoscopic harvest of right leg greater saphenous vein.  .. .. "The posterior descending and posterolateral were somewhat small, but  adequate targets.  The diagonal and LAD were large vessels and good targets for grafting. .. ..  Labs/Other Tests and Data Reviewed:    EKG:  An ECG dated 10/27/2018 was personally reviewed today and demonstrated:  NSR, anterior T wave inversions and prolonged QT  Recent Labs: 10/04/2018: TSH 7.170 10/07/2018: Magnesium 2.4 10/26/2018: BUN 16; Creatinine, Ser 1.21; Hemoglobin 11.2; Platelets 297; Potassium 5.0; Sodium 144 12/21/2018: ALT 17   Recent Lipid Panel Lab Results  Component Value Date/Time   CHOL 109 12/21/2018 08:10 AM   TRIG 61 12/21/2018 08:10 AM   HDL 38 (L) 12/21/2018 08:10 AM   CHOLHDL 2.9 12/21/2018 08:10 AM   CHOLHDL 4.1 10/02/2018 08:30 AM   LDLCALC 59 12/21/2018 08:10 AM    Wt Readings from Last 3 Encounters:  05/04/19 208 lb (94.3 kg)  01/01/19 219 lb 6.4 oz (99.5 kg)  12/30/18 214 lb 15.2 oz (97.5 kg)     Objective:    Vital Signs:  BP 132/85   Pulse (!) 52   Ht 5\' 10"  (1.778 m)  Wt 208 lb (94.3 kg)   BMI 29.84 kg/m    VITAL SIGNS:  reviewed GEN:  no acute distress EYES:  sclerae anicteric, EOMI - Extraocular Movements Intact RESPIRATORY:  normal respiratory effort, symmetric expansion CARDIOVASCULAR:  no peripheral edema SKIN:  no rash, lesions or ulcers. MUSCULOSKELETAL:  no obvious deformities. NEURO:  alert and oriented x 3, no obvious focal deficit PSYCH:  normal affect borderline obese; smiling, appears comfortable  ASSESSMENT & PLAN:    1. CAD: He is describing typical exertional angina pectoris, CCS class II.  His resting heart rate is slow so I do not think we can increase his beta-blocker.  We will add long-acting nitrates, isosorbide mononitrate 30 mg once daily.  Reevaluate response to therapy after about a month.  Spent a long time discussing the difference in the mechanism and significance of stable and unstable angina and told him when he should seek urgent medical attention.  Continue aspirin and statin 2. HLP: Excellent lipid  profile at last assessment. 3. HTN: Fair control.  Bradycardia is asymptomatic but precludes additional beta-blocker.  He had ankle swelling on higher doses of amlodipine. 4. AFib: Had very brief postoperative atrial fibrillation without recurrence.  Antiarrhythmics and anticoagulation has not been prescribed. 5. Overweight: Recommend additional weight loss.  COVID-19 Education: The signs and symptoms of COVID-19 were discussed with the patient and how to seek care for testing (follow up with PCP or arrange E-visit).  The importance of social distancing was discussed today.  Time:   Today, I have spent 21 minutes with the patient with telehealth technology discussing the above problems.     Medication Adjustments/Labs and Tests Ordered: Current medicines are reviewed at length with the patient today.  Concerns regarding medicines are outlined above.   Tests Ordered: No orders of the defined types were placed in this encounter.   Medication Changes: No orders of the defined types were placed in this encounter.   Disposition:  Follow up 30 days  Signed, Sanda Klein, MD  05/04/2019 2:04 PM    [facility]

## 2019-05-27 ENCOUNTER — Other Ambulatory Visit: Payer: Self-pay | Admitting: Internal Medicine

## 2019-06-01 ENCOUNTER — Other Ambulatory Visit: Payer: Self-pay | Admitting: Cardiovascular Disease

## 2019-06-14 ENCOUNTER — Telehealth: Payer: Self-pay | Admitting: Physician Assistant

## 2019-06-14 ENCOUNTER — Telehealth: Payer: Self-pay | Admitting: Cardiovascular Disease

## 2019-06-14 NOTE — Telephone Encounter (Signed)
Mychart, smartphone, consent, pre reg complete 06/14/19 AF °

## 2019-06-14 NOTE — Telephone Encounter (Signed)
New message   Patient is returning call about preregistration. Please call.

## 2019-06-21 ENCOUNTER — Telehealth (INDEPENDENT_AMBULATORY_CARE_PROVIDER_SITE_OTHER): Payer: Medicare Other | Admitting: Physician Assistant

## 2019-06-21 VITALS — BP 132/72 | HR 54 | Ht 70.0 in | Wt 213.0 lb

## 2019-06-21 DIAGNOSIS — E785 Hyperlipidemia, unspecified: Secondary | ICD-10-CM

## 2019-06-21 DIAGNOSIS — Z951 Presence of aortocoronary bypass graft: Secondary | ICD-10-CM

## 2019-06-21 DIAGNOSIS — I10 Essential (primary) hypertension: Secondary | ICD-10-CM

## 2019-06-21 DIAGNOSIS — I25118 Atherosclerotic heart disease of native coronary artery with other forms of angina pectoris: Secondary | ICD-10-CM | POA: Diagnosis not present

## 2019-06-21 DIAGNOSIS — E7800 Pure hypercholesterolemia, unspecified: Secondary | ICD-10-CM

## 2019-06-21 NOTE — Progress Notes (Signed)
Thanks, Jesse 

## 2019-06-21 NOTE — Progress Notes (Signed)
[name]    Virtual Visit via Video Note   This visit type was conducted due to national recommendations for restrictions regarding the COVID-19 Pandemic (e.g. social distancing) in an effort to limit this patient's exposure and mitigate transmission in our community.  Due to his co-morbid illnesses, this patient is at least at moderate risk for complications without adequate follow up.  This format is felt to be most appropriate for this patient at this time.  All issues noted in this document were discussed and addressed.  A limited physical exam was performed with this format.  Please refer to the patient's chart for his consent to telehealth for [name].  Evaluation Performed:  Follow-up visit  This visit type was conducted due to national recommendations for restrictions regarding the COVID-19 Pandemic (e.g. social distancing).  This format is felt to be most appropriate for this patient at this time.  All issues noted in this document were discussed and addressed.  No physical exam was performed (except for noted visual exam findings with Video Visits).  Please refer to the patient's chart (MyChart message for video visits and phone note for telephone visits) for the patient's consent to telehealth for [name]  Date:  06/21/2019   ID:  David [name], DOB 06/13/1951, MRN [id number]  Patient Location:  [place] [address]   Provider location:   [facility] [address] [place], [place] [address]   PCP:  Orpah Melter, MD  Cardiologist:  Sanda Klein, MD  Electrophysiologist:  None   Chief Complaint: 1 month follow-up atypical chest pain  History of Present Illness:    David [name] is a 68 y.o. male who presents via audio/video conferencing for a telehealth visit today.  Patient verified DOB and address.  He has a history of coronary artery disease with a small non-ST segment elevation myocardial infarction in October 2019.  He was  found to have chronic total occlusion of the right coronary artery and high-grade 99% stenosis of mid LAD with involvement of the second diagonal branch.  Ultimately underwent CABG x4 by Dr. Darcey Nora.  (10/06/2018) LIMA to LAD, SVG to diagonal, sequential SVG to PDA and PLV)  He then attended cardiac rehab which she completed without problems.  However, as he became more physically active he began to describe discomfort with increased physical activity.  The patient describes the discomfort as "a muscle pull" type of sensation in the central left part of his chest radiating to his back.  This pain resolved with rest of 1 to 2 minutes.  The discomfort was not associated with shortness of breath, diaphoresis or any other symptoms.  Isosorbide mononitrate 30 mg once daily was prescribed for treatment of the chest discomfort.  PMH of NSTEMI, unstable angina, coronary artery disease, essential hypertension, elevated troponins, status post CABG x4, and dyslipidemia.  Today patient states he feels well.  He states that he has been cutting his grass with his push mower, able to walk up hills without chest pain, and back to his normal activities.  He states he has been walking more without discomfort.  However, he states he does notice chest numbness and left scapular numbness when he stands in one position such as fishing or chopping food in the kitchen.  He states that this numbness/discomfort goes away in 3 to 4 minutes once he starts moving again.  He states that he has not noticed any headaches or change in symptoms while he is taking Imdur.  He denies chest pain, shortness of breath, palpitations, lower extremity edema, melena, hematuria, hemoptysis, syncope, presyncope, headaches, orthopnea and PND.  The patient does not symptoms concerning for COVID-19 infection (fever, chills, cough, or new SHORTNESS OF BREATH).  He continues to wear a mask and socially distance when he goes out in public.    Disposition: Follow-up with Dr. Sallyanne Kuster in 4 months.  Prior CV studies:   The following studies were reviewed today:  EKG 10/27/2018: Sinus rhythm with first-degree AV block, no ST deviation, 61 bpm.  Echocardiogram 10/02/2018: Left ventricle: The cavity size was normal. Systolic function was   normal. The estimated ejection fraction was in the range of 55%   to 60%. Mild hypokinesis of the basal-midinferior and   inferoseptal myocardium; consistent with ischemia or infarction   in the distribution of the right coronary artery. Doppler   parameters are consistent with abnormal left ventricular   relaxation (grade 1 diastolic dysfunction). - Left atrium: The atrium was mildly dilated.  Past Medical History:  Diagnosis Date  . Dyslipidemia   . Essential hypertension   . S/P CABG x 4    Past Surgical History:  Procedure Laterality Date  . CORONARY ARTERY BYPASS GRAFT N/A 10/06/2018   Procedure: CORONARY ARTERY BYPASS GRAFTING (CABG) x four, using left internal mammary artery and right leg greater saphenous vein harvested endoscopically;  Surgeon: Ivin Poot, MD;  Location: [place];  Service: Open Heart Surgery;  Laterality: N/A;  . LEFT HEART CATH AND CORONARY ANGIOGRAPHY N/A 10/02/2018   Procedure: LEFT HEART CATH AND CORONARY ANGIOGRAPHY;  Surgeon: Belva Crome, MD;  Location: [place] CV [facility];  Service: Cardiovascular;  Laterality: N/A;  . TEE WITHOUT CARDIOVERSION N/A 10/06/2018   Procedure: TRANSESOPHAGEAL ECHOCARDIOGRAM (TEE);  Surgeon: Prescott Gum, Collier Salina, MD;  Location: [place];  Service: Open Heart Surgery;  Laterality: N/A;     No outpatient medications have been marked as taking for the 06/21/19 encounter (Appointment) with Almyra Deforest, [place].     Allergies:   Patient has no known allergies.   Social History   Tobacco Use  . Smoking status: Never Smoker  . Smokeless tobacco: Never Used  Substance Use Topics  . Alcohol use: Not on file  . Drug use: Not on file      Family Hx: The patient's family history includes Heart attack (age of onset: 61) in his brother; Heart attack (age of onset: 70) in his mother.  ROS:   Please see the history of present illness.     All other systems reviewed and are negative.   Labs/Other Tests and Data Reviewed:    Recent Labs: 10/04/2018: TSH 7.170 10/07/2018: Magnesium 2.4 10/26/2018: BUN 16; Creatinine, Ser 1.21; Hemoglobin 11.2; Platelets 297; Potassium 5.0; Sodium 144 12/21/2018: ALT 17   Recent Lipid Panel Lab Results  Component Value Date/Time   CHOL 109 12/21/2018 08:10 AM   TRIG 61 12/21/2018 08:10 AM   HDL 38 (L) 12/21/2018 08:10 AM   CHOLHDL 2.9 12/21/2018 08:10 AM   CHOLHDL 4.1 10/02/2018 08:30 AM   LDLCALC 59 12/21/2018 08:10 AM    Wt Readings from Last 3 Encounters:  05/04/19 208 lb (94.3 kg)  01/01/19 219 lb 6.4 oz (99.5 kg)  12/30/18 214 lb 15.2 oz (97.5 kg)     Exam:    Vital Signs:  There were no vitals taken for this visit.   He is in no acute distress, alert and oriented x3, able to ask and respond to  health-related medical questions appropriately.    ASSESSMENT & PLAN:      1.  Coronary artery disease-no chest pain, CCS class 2. Stop isosorbide mononitrate 30 mg tablet daily Continue nitroglycerin 0.4 mg sublingual tablet as needed Continue aspirin 81 mg tablet daily Increase physical activity as tolerated Heart healthy diet   2.  Essential hypertension- well-controlled Continue amlodipine 5 mg tablet daily Continue metoprolol tartrate 25 mg tablet twice daily Heart healthy/low-sodium diet Continue weight loss  3.  Hyperlipidemia-10/02/2018: VLDL 15 12/21/2018: Cholesterol, Total 109; HDL 38; LDL Calculated 59; Triglycerides 61 Continue atorvastatin 80 mg tablet (patient takes 40 mg twice daily) Increase physical activity as tolerated  4.  Atrial fibrillation- has had no recurrence post CABG.  Internal max and anticoagulation medications were not prescribed.    COVID-19 Education: The signs and symptoms of COVID-19 were discussed with the patient and how to seek care for testing (follow up with PCP or arrange E-visit).  The importance of social distancing was discussed today.  Patient Risk:   After full review of this patients clinical status, I feel that they are at least moderate risk at this time.  Time:   Today, I have spent 17 minutes with the patient with telehealth technology discussing coronary artery disease, medication, diet, exercise and hyperlipidemia.     Medication Adjustments/Labs and Tests Ordered: Current medicines are reviewed at length with the patient today.  Concerns regarding medicines are outlined above.   Tests Ordered: No orders of the defined types were placed in this encounter.  Medication Changes: No orders of the defined types were placed in this encounter.   Disposition: Follow-up with Dr. Sallyanne Kuster in 4 months.  Signed, Deberah Pelton, NP  06/21/2019 1:56 PM    [facility]

## 2019-06-21 NOTE — Patient Instructions (Signed)
Medication Instructions:   STOP IMDUR  If you need a refill on your cardiac medications before your next appointment, please call your pharmacy.   Lab work:  NONE ordered at this time of appointment   If you have labs (blood work) drawn today and your tests are completely normal, you will receive your results only by: Marland Kitchen MyChart Message (if you have MyChart) OR . A paper copy in the mail If you have any lab test that is abnormal or we need to change your treatment, we will call you to review the results.  Testing/Procedures:  NONE ordered at this time of appointment   Follow-Up: At [facility], you and your health needs are our priority.  As part of our continuing mission to provide you with exceptional heart care, we have created designated Provider Care Teams.  These Care Teams include your primary Cardiologist (physician) and Advanced Practice Providers (APPs -  Physician Assistants and Nurse Practitioners) who all work together to provide you with the care you need, when you need it. You will need a follow up appointment in 4 months.  Please call our office 2 months in advance to schedule this appointment.  You may see Sanda Klein, MD or one of the following Advanced Practice Providers on your designated Care Team: [place], [place] . Fabian Sharp, PA-C  Any Other Special Instructions Will Be Listed Below (If Applicable).

## 2019-06-22 NOTE — Progress Notes (Signed)
Thanks, agree, [employer]

## 2019-07-04 ENCOUNTER — Other Ambulatory Visit: Payer: Self-pay | Admitting: Internal Medicine

## 2019-10-14 ENCOUNTER — Other Ambulatory Visit: Payer: Self-pay

## 2019-10-14 ENCOUNTER — Ambulatory Visit: Payer: Medicare Other | Admitting: Cardiovascular Disease

## 2019-10-14 ENCOUNTER — Encounter: Payer: Self-pay | Admitting: Cardiovascular Disease

## 2019-10-14 VITALS — BP 163/97 | HR 60 | Ht 70.0 in | Wt 223.0 lb

## 2019-10-14 DIAGNOSIS — I2510 Atherosclerotic heart disease of native coronary artery without angina pectoris: Secondary | ICD-10-CM | POA: Diagnosis not present

## 2019-10-14 DIAGNOSIS — E7800 Pure hypercholesterolemia, unspecified: Secondary | ICD-10-CM

## 2019-10-14 DIAGNOSIS — I10 Essential (primary) hypertension: Secondary | ICD-10-CM

## 2019-10-14 DIAGNOSIS — I4891 Unspecified atrial fibrillation: Secondary | ICD-10-CM

## 2019-10-14 DIAGNOSIS — I9789 Other postprocedural complications and disorders of the circulatory system, not elsewhere classified: Secondary | ICD-10-CM | POA: Diagnosis not present

## 2019-10-14 DIAGNOSIS — I739 Peripheral vascular disease, unspecified: Secondary | ICD-10-CM

## 2019-10-14 NOTE — Patient Instructions (Signed)
Medication Instructions:  No changes *If you need a refill on your cardiac medications before your next appointment, please call your pharmacy*  Lab Work: None ordered If you have labs (blood work) drawn today and your tests are completely normal, you will receive your results only by: Marland Kitchen MyChart Message (if you have MyChart) OR . A paper copy in the mail If you have any lab test that is abnormal or we need to change your treatment, we will call you to review the results.  Testing/Procedures: Your physician has requested that you have a lower extremity arterial duplex. During this test, ultrasound is used to evaluate arterial blood flow in the legs. Allow one hour for this exam. There are no restrictions or special instructions. This will take place at [place], [address].  Your physician has requested that you have an ankle brachial index (ABI). During this test an ultrasound and blood pressure cuff are used to evaluate the arteries that supply the arms and legs with blood. Allow thirty minutes for this exam. There are no restrictions or special instructions. This will take place at [place], [address].   Follow-Up: At [facility], you and your health needs are our priority.  As part of our continuing mission to provide you with exceptional heart care, we have created designated Provider Care Teams.  These Care Teams include your primary Cardiologist (physician) and Advanced Practice Providers (APPs -  Physician Assistants and Nurse Practitioners) who all work together to provide you with the care you need, when you need it.  Your next appointment:   12 months  The format for your next appointment:   In Person  Provider:   Sanda Klein, MD  Other Instructions Dr. Sallyanne Kuster would like you to check your blood pressure DAILY for the next  weeks.  Keep a journal of these daily BP and heart rate readings and call our office or send a message through [place] with the  results.

## 2019-10-14 NOTE — Progress Notes (Signed)
Cardiology Office Note:    Date:  10/16/2019   ID:  [name], DOB 1951/04/21, MRN [id number]  PCP:  Orpah Melter, MD  Cardiologist:  Sanda Klein, MD  Electrophysiologist:  None   Referring MD: Orpah Melter, MD   Chief Complaint  Patient presents with  . Coronary Artery Disease  . PAD    History of Present Illness:    David [name] is a 68 y.o. male who presented with a small non-STEMI in October 2019, was found to have multivessel coronary artery disease with total occlusion of the right coronary artery and high-grade stenosis of the LAD and large diagonal branch, subsequently undergoing four-vessel bypass surgery (LIMA to LAD, SVG to diagonal, sequential SVG to PLA and PDA, Dr. Prescott Gum, October 06, 2018).  Surgery was complicated by transient postoperative atrial fibrillation.  He took amiodarone briefly, but was never prescribed anticoagulants.  He has preserved left ventricular systolic function, with inferior wall hypokinesis.  He has hypertension, hyperlipidemia and quit smoking many years ago.  The patient specifically denies any chest pain at rest or with exertion, dyspnea at rest or with exertion, orthopnea, paroxysmal nocturnal dyspnea, syncope, palpitations, focal neurological deficits, unexplained weight gain, cough, hemoptysis or wheezing. At the end of the day, he occasionally has mild swelling in the right leg where the saphenectomy was performed.  This goes away by the next morning.  He has a few ER that can use a push mower without problems however he does occasionally develop cramping pain in his left leg when he walks uphill at a fast pace.   Past Medical History:  Diagnosis Date  . Dyslipidemia   . Essential hypertension   . S/P CABG x 4     Past Surgical History:  Procedure Laterality Date  . CORONARY ARTERY BYPASS GRAFT N/A 10/06/2018   Procedure: CORONARY ARTERY BYPASS GRAFTING (CABG) x four, using left internal mammary artery and right leg  greater saphenous vein harvested endoscopically;  Surgeon: Ivin Poot, MD;  Location: [place];  Service: Open Heart Surgery;  Laterality: N/A;  . LEFT HEART CATH AND CORONARY ANGIOGRAPHY N/A 10/02/2018   Procedure: LEFT HEART CATH AND CORONARY ANGIOGRAPHY;  Surgeon: Belva Crome, MD;  Location: [place] CV LAB;  Service: Cardiovascular;  Laterality: N/A;  . TEE WITHOUT CARDIOVERSION N/A 10/06/2018   Procedure: TRANSESOPHAGEAL ECHOCARDIOGRAM (TEE);  Surgeon: Prescott Gum, Collier Salina, MD;  Location: [place];  Service: Open Heart Surgery;  Laterality: N/A;    Current Medications: Current Meds  Medication Sig  . amLODipine (NORVASC) 5 MG tablet Take 5 mg by mouth daily.   Marland Kitchen aspirin EC 81 MG tablet Take 81 mg by mouth daily.  Marland Kitchen atorvastatin (LIPITOR) 80 MG tablet Take 40 mg by mouth daily. Pt takes 40 mg  a day  . metoprolol tartrate (LOPRESSOR) 25 MG tablet TAKE 1 TABLET BY MOUTH TWICE A DAY  . tamsulosin (FLOMAX) 0.4 MG CAPS capsule TAKE 1 CAPSULE BY MOUTH  DAILY     Allergies:   Patient has no known allergies.   Social History   Socioeconomic History  . Marital status: Married    Spouse name: Not on file  . Number of children: Not on file  . Years of education: Not on file  . Highest education level: Not on file  Occupational History  . Not on file  Social Needs  . Financial resource strain: Not on file  . Food insecurity    Worry: Not on file  Inability: Not on file  . Transportation needs    Medical: Not on file    Non-medical: Not on file  Tobacco Use  . Smoking status: Never Smoker  . Smokeless tobacco: Never Used  Substance and Sexual Activity  . Alcohol use: Not on file  . Drug use: Not on file  . Sexual activity: Not on file  Lifestyle  . Physical activity    Days per week: Not on file    Minutes per session: Not on file  . Stress: Not on file  Relationships  . Social Herbalist on phone: Not on file    Gets together: Not on file    Attends  religious service: Not on file    Active member of club or organization: Not on file    Attends meetings of clubs or organizations: Not on file    Relationship status: Not on file  Other Topics Concern  . Not on file  Social History Narrative  . Not on file     Family History: The patient's family history includes Heart attack (age of onset: 11) in his brother; Heart attack (age of onset: 78) in his mother.  ROS:   Please see the history of present illness.    All other systems are reviewed and are negative.  EKGs/Labs/Other Studies Reviewed:    The following studies were reviewed today: Coronary angiogram  EKG:  EKG is ordered today.  Shows sinus rhythm with a single PVC but is otherwise normal except for mildly prolonged QTc 478 ms  Recent Labs: 10/26/2018: BUN 16; Creatinine, Ser 1.21; Hemoglobin 11.2; Platelets 297; Potassium 5.0; Sodium 144 12/21/2018: ALT 17  Recent Lipid Panel    Component Value Date/Time   CHOL 109 12/21/2018 0810   TRIG 61 12/21/2018 0810   HDL 38 (L) 12/21/2018 0810   CHOLHDL 2.9 12/21/2018 0810   CHOLHDL 4.1 10/02/2018 0830   VLDL 15 10/02/2018 0830   LDLCALC 59 12/21/2018 0810   A more recent lipid profile from 05/25/2019 shows total cholesterol 122, HDL 39, LDL 71, triglycerides 57 Physical Exam:    VS:  BP (!) 163/97   Pulse 60   Ht 5\' 10"  (1.778 m)   Wt 223 lb (101.2 kg)   SpO2 97%   BMI 32.00 kg/m     Wt Readings from Last 3 Encounters:  10/14/19 223 lb (101.2 kg)  06/21/19 213 lb (96.6 kg)  05/04/19 208 lb (94.3 kg)     General: Alert, oriented x3, no distress, mildly obese Head: no evidence of trauma, PERRL, EOMI, no exophtalmos or lid lag, no myxedema, no xanthelasma; normal ears, nose and oropharynx Neck: normal jugular venous pulsations and no hepatojugular reflux; brisk carotid pulses without delay and no carotid bruits Chest: clear to auscultation, no signs of consolidation by percussion or palpation, normal fremitus,  symmetrical and full respiratory excursions Cardiovascular: normal position and quality of the apical impulse, regular rhythm, normal first and second heart sounds, no murmurs, rubs or gallops Abdomen: no tenderness or distention, no masses by palpation, no abnormal pulsatility or arterial bruits, normal bowel sounds, no hepatosplenomegaly Extremities: no clubbing, cyanosis or edema; 2+ radial, ulnar and brachial pulses bilaterally; 2+ right femoral, posterior tibial and dorsalis pedis pulses; 2+ left femoral, posterior tibial and dorsalis pedis pulses; no subclavian or femoral bruits Neurological: grossly nonfocal Psych: Normal mood and affect   ASSESSMENT:    1. Intermittent claudication ([place])   2. Coronary artery disease involving native  coronary artery of native heart without angina pectoris   3. Postoperative atrial fibrillation (HCC)   4. Hypercholesterolemia   5. Essential hypertension    PLAN:    In order of problems listed above:  1. CAD s/p CABG: Asymptomatic despite being very active.  On aspirin, statin and beta-blocker. 2. PAD: He gives a fairly convincing description of left calf intermittent claudication, although I can feel a reasonably good pulse in the left foot.  We will check ABI study. 3. AFib: Brief postoperative A. fib, resolved, no recurrence despite stopping antiarrhythmics.  He is not receiving anticoagulants 4. HLP: He had initially lost some weight after surgery but has gained it back.  Lipid profile is overall acceptable but his HDL is low.  Need to increase physical activity and try to lose weight.  Target weight is 134 inches. 5. HTN: His blood pressure was surprisingly high today.  When I rechecked it it was still elevated at 156/82.  I asked him to send me some readings from his home monitor because he reports that at home his blood pressure is within normal range.  We will also get a chance to reevaluate his blood pressure when he comes in for vascular  ultrasound study.   Medication Adjustments/Labs and Tests Ordered: Current medicines are reviewed at length with the patient today.  Concerns regarding medicines are outlined above.  Orders Placed This Encounter  Procedures  . EKG 12-Lead  . VAS [place] ABI WITH/WO TBI  . VAS [place] LOWER EXTREMITY ARTERIAL DUPLEX   No orders of the defined types were placed in this encounter.   Patient Instructions  Medication Instructions:  No changes *If you need a refill on your cardiac medications before your next appointment, please call your pharmacy*  Lab Work: None ordered If you have labs (blood work) drawn today and your tests are completely normal, you will receive your results only by: Marland Kitchen MyChart Message (if you have MyChart) OR . A paper copy in the mail If you have any lab test that is abnormal or we need to change your treatment, we will call you to review the results.  Testing/Procedures: Your physician has requested that you have a lower extremity arterial duplex. During this test, ultrasound is used to evaluate arterial blood flow in the legs. Allow one hour for this exam. There are no restrictions or special instructions. This will take place at [place], [address].  Your physician has requested that you have an ankle brachial index (ABI). During this test an ultrasound and blood pressure cuff are used to evaluate the arteries that supply the arms and legs with blood. Allow thirty minutes for this exam. There are no restrictions or special instructions. This will take place at [place], [address].   Follow-Up: At [facility], you and your health needs are our priority.  As part of our continuing mission to provide you with exceptional heart care, we have created designated Provider Care Teams.  These Care Teams include your primary Cardiologist (physician) and Advanced Practice Providers (APPs -  Physician Assistants and Nurse Practitioners) who all work together to  provide you with the care you need, when you need it.  Your next appointment:   12 months  The format for your next appointment:   In Person  Provider:   Sanda Klein, MD  Other Instructions Dr. Sallyanne Kuster would like you to check your blood pressure DAILY for the next  weeks.  Keep a journal of  these daily BP and heart rate readings and call our office or send a message through [place] with the results.       Signed, Sanda Klein, MD  10/16/2019 6:04 PM    [place]

## 2019-10-16 ENCOUNTER — Encounter: Payer: Self-pay | Admitting: Cardiovascular Disease

## 2019-10-18 ENCOUNTER — Telehealth: Payer: Self-pay | Admitting: Cardiovascular Disease

## 2019-10-18 NOTE — Telephone Encounter (Signed)
Left message for patient to call and schedule lower arterial dopplers and ABI's ordered by Dr. Sallyanne Kuster

## 2019-10-20 ENCOUNTER — Ambulatory Visit (HOSPITAL_COMMUNITY)
Admission: RE | Admit: 2019-10-20 | Discharge: 2019-10-20 | Disposition: A | Discharge status: Home / Self Care | Payer: Medicare Other | Source: Ambulatory Visit | Attending: Cardiology | Admitting: Cardiology

## 2019-10-20 ENCOUNTER — Other Ambulatory Visit: Payer: Self-pay

## 2019-10-20 DIAGNOSIS — I739 Peripheral vascular disease, unspecified: Secondary | ICD-10-CM | POA: Insufficient documentation

## 2019-10-28 ENCOUNTER — Encounter (HOSPITAL_COMMUNITY): Payer: Medicare Other

## 2019-11-16 ENCOUNTER — Institutional Professional Consult (permissible substitution): Payer: Medicare Other | Admitting: Cardiovascular Disease

## 2019-12-28 ENCOUNTER — Ambulatory Visit (INDEPENDENT_AMBULATORY_CARE_PROVIDER_SITE_OTHER): Payer: Medicare Other | Admitting: Cardiovascular Disease

## 2019-12-28 ENCOUNTER — Other Ambulatory Visit: Payer: Self-pay

## 2019-12-28 ENCOUNTER — Encounter: Payer: Self-pay | Admitting: Cardiovascular Disease

## 2019-12-28 VITALS — BP 177/103 | HR 60 | Temp 97.9°F | Ht 70.0 in | Wt 218.4 lb

## 2019-12-28 DIAGNOSIS — I10 Essential (primary) hypertension: Secondary | ICD-10-CM

## 2019-12-28 DIAGNOSIS — E785 Hyperlipidemia, unspecified: Secondary | ICD-10-CM

## 2019-12-28 DIAGNOSIS — I2510 Atherosclerotic heart disease of native coronary artery without angina pectoris: Secondary | ICD-10-CM

## 2019-12-28 DIAGNOSIS — I739 Peripheral vascular disease, unspecified: Secondary | ICD-10-CM

## 2019-12-28 MED ORDER — CILOSTAZOL 50 MG PO TABS: 50.0000 mg | ORAL_TABLET | Freq: Two times a day (BID) | ORAL | 1 refills | Status: DC

## 2019-12-28 NOTE — Patient Instructions (Signed)
Medication Instructions:  START PLETAL 50 MG TWICE A DAY   *If you need a refill on your cardiac medications before your next appointment, please call your pharmacy*  Lab Work: NONE  Testing/Procedures: NONE  Follow-Up: At [facility], you and your health needs are our priority.  As part of our continuing mission to provide you with exceptional heart care, we have created designated Provider Care Teams.  These Care Teams include your primary Cardiologist (physician) and Advanced Practice Providers (APPs -  Physician Assistants and Nurse Practitioners) who all work together to provide you with the care you need, when you need it.  Your next appointment:   3 month(s)  The format for your next appointment:   In Person  Provider:   DR Fletcher Anon

## 2019-12-28 NOTE — Progress Notes (Signed)
Cardiology Office Note   Date:  12/30/2019   ID:  David [name], DOB [birth year], MRN [id number]  PCP:  Orpah Melter, MD  Cardiologist: Dr. Doristine Bosworth chief complaint on file.     History of Present Illness: David [name] is a 69 y.o. male who was referred by Dr. Sallyanne Kuster for evaluation management of peripheral arterial disease. The patient has known history of coronary artery disease status post non-STEMI in October 2019.  Cardiac catheterization showed significant three-vessel coronary artery disease which was treated with CABG.  Echo showed normal LV systolic function.  Other chronic medical conditions include essential hypertension with possible whitecoat syndrome, hyperlipidemia and remote history of tobacco use.  He is not diabetic.  He has been doing well from a cardiac standpoint with no recurrent chest pain or shortness of breath.  He reports right calf discomfort after walking about 600 to 700 yards.  This became noticeable after his CABG and has been stable since then.  He likes to hike for exercise and typically he feels somewhat limited due to this discomfort.  He has no rest pain or lower extremity ulceration. He underwent noninvasive vascular evaluation which showed mildly reduced ABI on the right with severe popliteal artery stenosis.   Past Medical History:  Diagnosis Date  . Dyslipidemia   . Essential hypertension   . S/P CABG x 4     Past Surgical History:  Procedure Laterality Date  . CORONARY ARTERY BYPASS GRAFT N/A 10/06/2018   Procedure: CORONARY ARTERY BYPASS GRAFTING (CABG) x four, using left internal mammary artery and right leg greater saphenous vein harvested endoscopically;  Surgeon: Ivin Poot, MD;  Location: [facility];  Service: Open Heart Surgery;  Laterality: N/A;  . LEFT HEART CATH AND CORONARY ANGIOGRAPHY N/A 10/02/2018   Procedure: LEFT HEART CATH AND CORONARY ANGIOGRAPHY;  Surgeon: Belva Crome, MD;  Location: [facility];  Service:  Cardiovascular;  Laterality: N/A;  . TEE WITHOUT CARDIOVERSION N/A 10/06/2018   Procedure: TRANSESOPHAGEAL ECHOCARDIOGRAM (TEE);  Surgeon: Prescott Gum, Collier Salina, MD;  Location: [place];  Service: Open Heart Surgery;  Laterality: N/A;     Current Outpatient Medications  Medication Sig Dispense Refill  . amLODipine (NORVASC) 5 MG tablet Take 5 mg by mouth daily.     Marland Kitchen aspirin EC 81 MG tablet Take 81 mg by mouth daily.    Marland Kitchen atorvastatin (LIPITOR) 80 MG tablet Take 40 mg by mouth daily. Pt takes 40 mg  a day    . metoprolol tartrate (LOPRESSOR) 25 MG tablet TAKE 1 TABLET BY MOUTH TWICE A DAY 180 tablet 2  . tamsulosin (FLOMAX) 0.4 MG CAPS capsule TAKE 1 CAPSULE BY MOUTH  DAILY 90 capsule 2  . cilostazol (PLETAL) 50 MG tablet Take 1 tablet (50 mg total) by mouth 2 (two) times daily. 180 tablet 1  . nitroGLYCERIN (NITROSTAT) 0.4 MG SL tablet Place 1 tablet (0.4 mg total) under the tongue every 5 (five) minutes as needed for chest pain. 25 tablet 3   No current facility-administered medications for this visit.    Allergies:   Patient has no known allergies.    Social History:  The patient  reports that he has never smoked. He has never used smokeless tobacco.   Family History:  The patient's family history includes Heart attack (age of onset: 77) in his brother; Heart attack (age of onset: 16) in his mother.    ROS:  Please see the history of present illness.  Otherwise, review of systems are positive for none.   All other systems are reviewed and negative.    PHYSICAL EXAM: VS:  BP (!) 177/103   Pulse 60   Temp 97.9 F (36.6 C)   Ht 5\' 10"  (1.778 m)   Wt 218 lb 6.4 oz (99.1 kg)   SpO2 97%   BMI 31.34 kg/m  , BMI Body mass index is 31.34 kg/m. GEN: Well nourished, well developed, in no acute distress  HEENT: normal  Neck: no JVD, carotid bruits, or masses Cardiac: RRR; no murmurs, rubs, or gallops,no edema  Respiratory:  clear to auscultation bilaterally, normal work of breathing GI:  soft, nontender, nondistended, + BS MS: no deformity or atrophy  Skin: warm and dry, no rash Neuro:  Strength and sensation are intact Psych: euthymic mood, full affect Vascular: Femoral pulses normal bilaterally.  Distal pulses are palpable on the left side only.   EKG:  EKG is not ordered today.   Recent Labs: No results found for requested labs within last 8760 hours.    Lipid Panel    Component Value Date/Time   CHOL 109 12/21/2018 0810   TRIG 61 12/21/2018 0810   HDL 38 (L) 12/21/2018 0810   CHOLHDL 2.9 12/21/2018 0810   CHOLHDL 4.1 10/02/2018 0830   VLDL 15 10/02/2018 0830   LDLCALC 59 12/21/2018 0810      Wt Readings from Last 3 Encounters:  12/28/19 218 lb 6.4 oz (99.1 kg)  10/14/19 223 lb (101.2 kg)  06/21/19 213 lb (96.6 kg)      No flowsheet data found.    ASSESSMENT AND PLAN:  1.  Peripheral arterial disease: Moderate to severe right calf claudication due to severe popliteal artery stenosis.  I discussed with him the natural history and management of claudication.  This is not a limb threatening situation.  I explained to him that the goal of revascularization is mainly to improve symptoms.  I only recommend this if he fails a walking program.  In addition, I am going to prescribe cilostazol 50 mg twice daily.  I discussed side effects with him.  I discussed the importance of a minimum of 30 minutes of walking daily and he is going to do that. Reevaluate symptoms in 3 months.  If no improvement, angiography and endovascular intervention can be considered.  2.  Coronary artery disease involving native coronary arteries without angina: Continue medical therapy.  3.  Essential hypertension with possible whitecoat syndrome: He reports that his home blood pressure readings are almost always normal.  Continue to monitor for now.  4.  Hyperlipidemia: Continue treatment with atorvastatin.  Most recent lipid profile showed an LDL of 59.    Disposition:   FU with  me in 3 months  Signed,  Kathlyn Sacramento, MD  12/30/2019 12:38 PM    [place]

## 2019-12-30 NOTE — Progress Notes (Signed)
Thank you :)

## 2020-01-20 ENCOUNTER — Ambulatory Visit: Payer: Medicare Other

## 2020-01-28 ENCOUNTER — Ambulatory Visit: Payer: Medicare Other | Attending: Internal Medicine

## 2020-01-28 DIAGNOSIS — Z23 Encounter for immunization: Secondary | ICD-10-CM

## 2020-01-28 NOTE — Progress Notes (Signed)
   Covid-19 Vaccination Clinic  Name:  David [name]    MRN: [id number] DOB: 05/14/1951  01/28/2020  Mr. David [name] was observed post Covid-19 immunization for 15 minutes without incidence. He was provided with Vaccine Information Sheet and instruction to access the V-Safe system.   Mr. David [name] was instructed to call 911 with any severe reactions post vaccine: Marland Kitchen Difficulty breathing  . Swelling of your face and throat  . A fast heartbeat  . A bad rash all over your body  . Dizziness and weakness    Immunizations Administered    Name Date Dose VIS Date Route   Pfizer COVID-19 Vaccine 01/28/2020  4:36 PM 0.3 mL 12/03/2019 Intramuscular   Manufacturer: [place]   Lot: [id number]   [place]: [id number]

## 2020-01-31 ENCOUNTER — Ambulatory Visit: Payer: Medicare Other

## 2020-02-22 ENCOUNTER — Ambulatory Visit: Payer: Medicare Other | Attending: Internal Medicine

## 2020-02-22 DIAGNOSIS — Z23 Encounter for immunization: Secondary | ICD-10-CM | POA: Insufficient documentation

## 2020-02-22 NOTE — Progress Notes (Signed)
   Covid-19 Vaccination Clinic  Name:  David [name]    MRN: [id number] DOB: 1951-09-04  02/22/2020  David [name] was observed post Covid-19 immunization for 15 minutes without incident. He was provided with Vaccine Information Sheet and instruction to access the V-Safe system.   David [name] was instructed to call 911 with any severe reactions post vaccine: Marland Kitchen Difficulty breathing  . Swelling of face and throat  . A fast heartbeat  . A bad rash all over body  . Dizziness and weakness   Immunizations Administered    Name Date Dose VIS Date Route   Pfizer COVID-19 Vaccine 02/22/2020  3:28 PM 0.3 mL 12/03/2019 Intramuscular   Manufacturer: [place]   Lot: [id number]   [place]: [id number]

## 2020-03-28 ENCOUNTER — Other Ambulatory Visit: Payer: Self-pay

## 2020-03-28 ENCOUNTER — Ambulatory Visit: Payer: Medicare Other | Admitting: Cardiovascular Disease

## 2020-03-28 ENCOUNTER — Encounter: Payer: Self-pay | Admitting: Cardiovascular Disease

## 2020-03-28 VITALS — BP 168/92 | HR 56 | Ht 70.0 in | Wt 224.6 lb

## 2020-03-28 DIAGNOSIS — I2510 Atherosclerotic heart disease of native coronary artery without angina pectoris: Secondary | ICD-10-CM | POA: Diagnosis not present

## 2020-03-28 DIAGNOSIS — I10 Essential (primary) hypertension: Secondary | ICD-10-CM | POA: Diagnosis not present

## 2020-03-28 DIAGNOSIS — I739 Peripheral vascular disease, unspecified: Secondary | ICD-10-CM | POA: Diagnosis not present

## 2020-03-28 DIAGNOSIS — E785 Hyperlipidemia, unspecified: Secondary | ICD-10-CM

## 2020-03-28 MED ORDER — CILOSTAZOL 100 MG PO TABS: 100.0000 mg | ORAL_TABLET | Freq: Two times a day (BID) | ORAL | 5 refills | Status: DC

## 2020-03-28 NOTE — Progress Notes (Signed)
Cardiology Office Note   Date:  03/29/2020   ID:  David [name], DOB [birth year], MRN [id number]  PCP:  Orpah Melter, MD  Cardiologist: Dr. Sallyanne Kuster  No chief complaint on file.     History of Present Illness: David [name] is a 69 y.o. male who is here today for follow-up visit regarding peripheral arterial disease.   The patient has known history of coronary artery disease status post non-STEMI in October 2019.  Cardiac catheterization showed significant three-vessel coronary artery disease which was treated with CABG.  Echo showed normal LV systolic function.  Other chronic medical conditions include essential hypertension with possible whitecoat syndrome, hyperlipidemia and remote history of tobacco use.  He is not diabetic. He was seen recently for moderate to severe right calf claudication. He likes to hike for exercise and typically he feels somewhat limited due to this discomfort.  He has no rest pain or lower extremity ulceration. He underwent noninvasive vascular evaluation which showed mildly reduced ABI on the right with severe popliteal artery stenosis. During initial evaluation, I recommended a walking exercise program and added cilostazol 50 mg twice daily. He has been walking almost 45 minutes every day with improvement in symptoms.  No side effects with cilostazol.  He has right calf discomfort only if he intensifies his walking.  Symptoms are not happening with regular activities.   Past Medical History:  Diagnosis Date  . Dyslipidemia   . Essential hypertension   . S/P CABG x 4     Past Surgical History:  Procedure Laterality Date  . CORONARY ARTERY BYPASS GRAFT N/A 10/06/2018   Procedure: CORONARY ARTERY BYPASS GRAFTING (CABG) x four, using left internal mammary artery and right leg greater saphenous vein harvested endoscopically;  Surgeon: Ivin Poot, MD;  Location: [facility];  Service: Open Heart Surgery;  Laterality: N/A;  . LEFT HEART CATH AND CORONARY  ANGIOGRAPHY N/A 10/02/2018   Procedure: LEFT HEART CATH AND CORONARY ANGIOGRAPHY;  Surgeon: Belva Crome, MD;  Location: [facility];  Service: Cardiovascular;  Laterality: N/A;  . TEE WITHOUT CARDIOVERSION N/A 10/06/2018   Procedure: TRANSESOPHAGEAL ECHOCARDIOGRAM (TEE);  Surgeon: Prescott Gum, Collier Salina, MD;  Location: [facility];  Service: Open Heart Surgery;  Laterality: N/A;     Current Outpatient Medications  Medication Sig Dispense Refill  . amLODipine (NORVASC) 5 MG tablet Take 5 mg by mouth daily.     Marland Kitchen aspirin EC 81 MG tablet Take 81 mg by mouth daily.    Marland Kitchen atorvastatin (LIPITOR) 80 MG tablet Take 40 mg by mouth daily. Pt takes 40 mg  a day    . cilostazol (PLETAL) 100 MG tablet Take 1 tablet (100 mg total) by mouth 2 (two) times daily. 60 tablet 5  . metoprolol tartrate (LOPRESSOR) 25 MG tablet TAKE 1 TABLET BY MOUTH TWICE A DAY 180 tablet 2  . nitroGLYCERIN (NITROSTAT) 0.4 MG SL tablet Place 1 tablet (0.4 mg total) under the tongue every 5 (five) minutes as needed for chest pain. 25 tablet 3  . tamsulosin (FLOMAX) 0.4 MG CAPS capsule TAKE 1 CAPSULE BY MOUTH  DAILY 90 capsule 2   No current facility-administered medications for this visit.    Allergies:   Patient has no known allergies.    Social History:  The patient  reports that he has never smoked. He has never used smokeless tobacco.   Family History:  The patient's family history includes Heart attack (age of onset: 72) in his brother; Heart  attack (age of onset: 51) in his mother.    ROS:  Please see the history of present illness.   Otherwise, review of systems are positive for none.   All other systems are reviewed and negative.    PHYSICAL EXAM: VS:  BP (!) 168/92   Pulse (!) 56   Ht 5\' 10"  (1.778 m)   Wt 224 lb 9.6 oz (101.9 kg)   BMI 32.23 kg/m  , BMI Body mass index is 32.23 kg/m. GEN: Well nourished, well developed, in no acute distress  HEENT: normal  Neck: no JVD, carotid bruits, or masses Cardiac:  RRR; no murmurs, rubs, or gallops,no edema  Respiratory:  clear to auscultation bilaterally, normal work of breathing GI: soft, nontender, nondistended, + BS MS: no deformity or atrophy  Skin: warm and dry, no rash Neuro:  Strength and sensation are intact Psych: euthymic mood, full affect    EKG:  EKG is ordered today. EKG showed sinus bradycardia with first-degree AV block   Recent Labs: No results found for requested labs within last 8760 hours.    Lipid Panel    Component Value Date/Time   CHOL 109 12/21/2018 0810   TRIG 61 12/21/2018 0810   HDL 38 (L) 12/21/2018 0810   CHOLHDL 2.9 12/21/2018 0810   CHOLHDL 4.1 10/02/2018 0830   VLDL 15 10/02/2018 0830   LDLCALC 59 12/21/2018 0810      Wt Readings from Last 3 Encounters:  03/28/20 224 lb 9.6 oz (101.9 kg)  12/28/19 218 lb 6.4 oz (99.1 kg)  10/14/19 223 lb (101.2 kg)      No flowsheet data found.    ASSESSMENT AND PLAN:  1.  Peripheral arterial disease: Moderate right calf claudication which improved with a walking program and small dose cilostazol.  I elected to increase cilostazol to 100 mg twice daily.  Given improvement in symptoms and the fact that his claudication is currently not lifestyle limiting, I recommend continuing medical therapy.  2.  Coronary artery disease involving native coronary arteries without angina: Continue medical therapy.  3.  Essential hypertension with possible whitecoat syndrome: He reports that his home blood pressure readings are almost always normal.  Continue to monitor for now.  4.  Hyperlipidemia: Continue treatment with atorvastatin.  Most recent lipid profile showed an LDL of 59.    Disposition:   FU with me in 6 months  Signed,  Kathlyn Sacramento, MD  03/29/2020 9:55 AM    [place]

## 2020-03-28 NOTE — Patient Instructions (Signed)
Medication Instructions:  INCREASE the Cilostazol (Pletal) to 100 mg twice daily  *If you need a refill on your cardiac medications before your next appointment, please call your pharmacy*   Lab Work: None ordered If you have labs (blood work) drawn today and your tests are completely normal, you will receive your results only by: Marland Kitchen MyChart Message (if you have MyChart) OR . A paper copy in the mail If you have any lab test that is abnormal or we need to change your treatment, we will call you to review the results.   Testing/Procedures: None ordered   Follow-Up: At [facility], you and your health needs are our priority.  As part of our continuing mission to provide you with exceptional heart care, we have created designated Provider Care Teams.  These Care Teams include your primary Cardiologist (physician) and Advanced Practice Providers (APPs -  Physician Assistants and Nurse Practitioners) who all work together to provide you with the care you need, when you need it.  We recommend signing up for the patient portal called "MyChart".  Sign up information is provided on this After Visit Summary.  MyChart is used to connect with patients for Virtual Visits (Telemedicine).  Patients are able to view lab/test results, encounter notes, upcoming appointments, etc.  Non-urgent messages can be sent to your provider as well.   To learn more about what you can do with MyChart, go to [email or web address].    Your next appointment:   6 month(s)  The format for your next appointment:   In Person  Provider:   Kathlyn Sacramento, MD

## 2020-03-29 NOTE — Progress Notes (Signed)
Thank you :)

## 2020-04-06 ENCOUNTER — Other Ambulatory Visit: Payer: Self-pay | Admitting: Cardiovascular Disease

## 2020-06-27 IMAGING — CR DG CHEST 1V PORT
1 series · 1 of 1 positions shown · non-contrast
Comparison: 8909 hours today and earlier.

CLINICAL DATA: 67-year-old male postoperative day zero status post
CABG.

EXAM:
PORTABLE CHEST 1 VIEW

[AP]
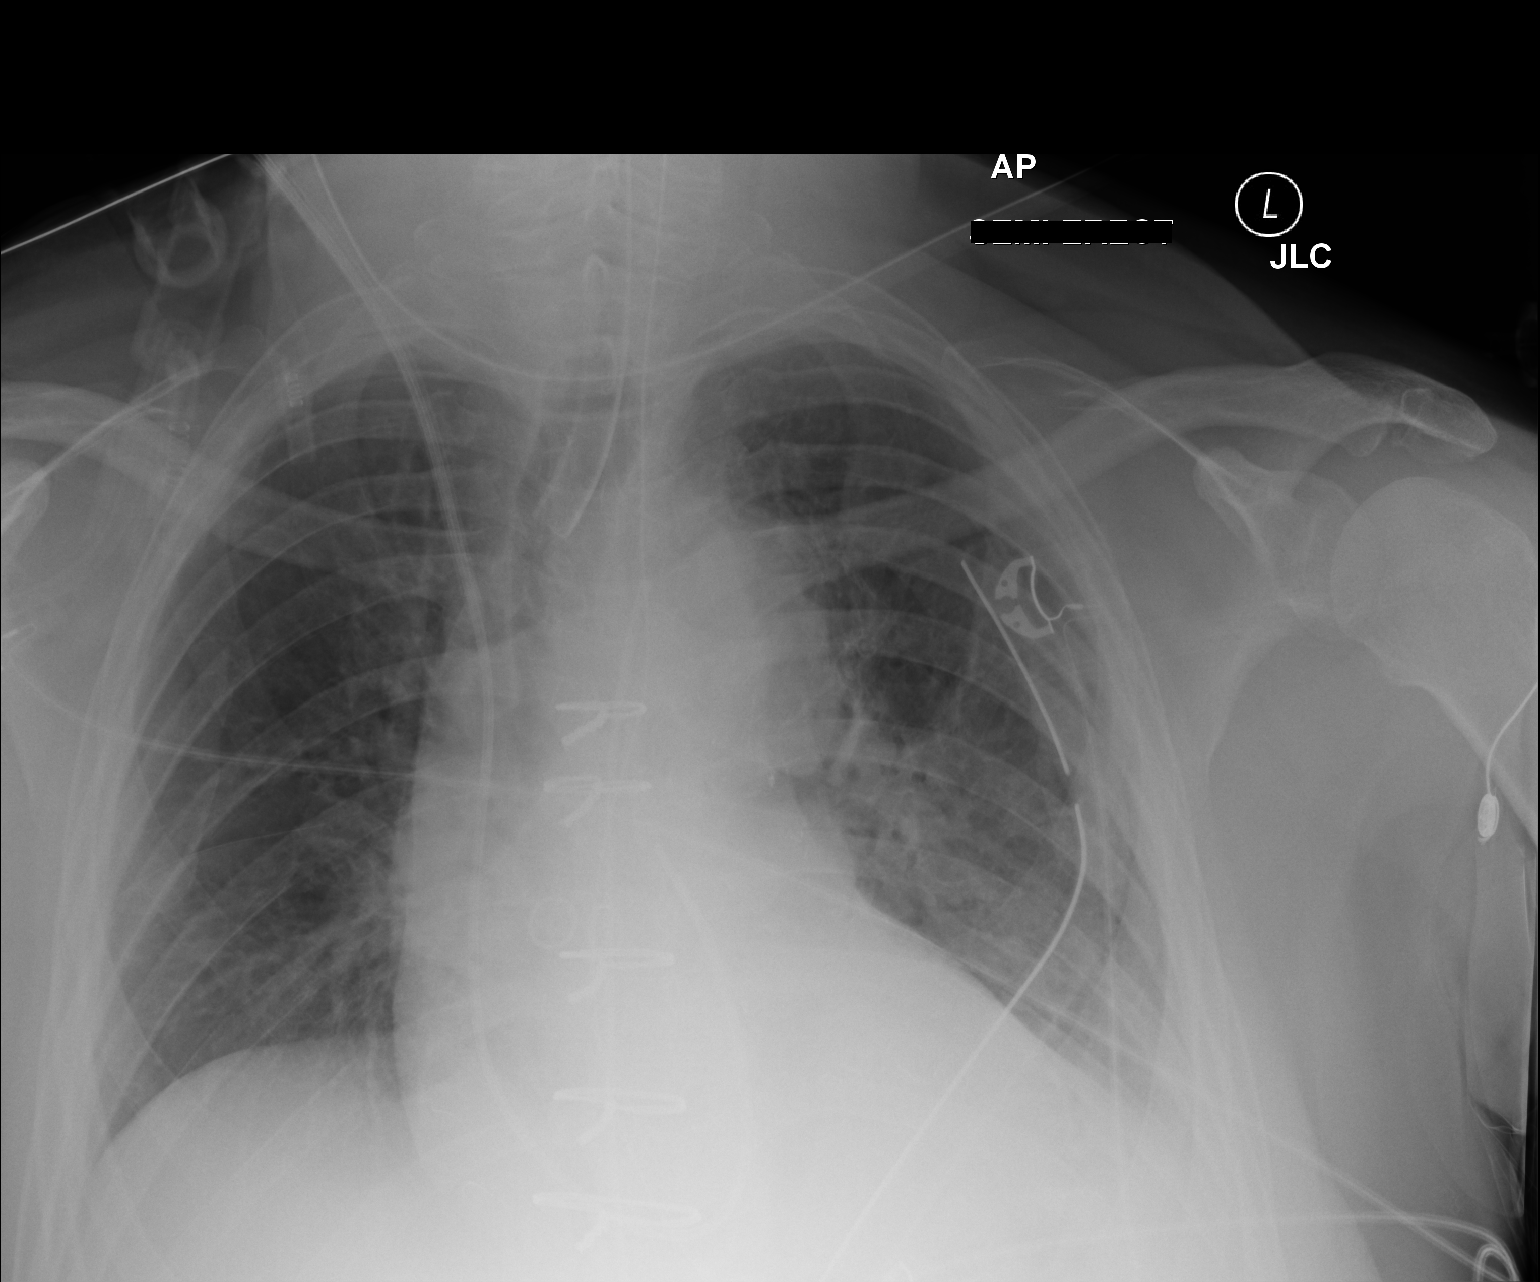

[1 of 1 positions shown; findings below may reference images not displayed]

FINDINGS: Portable AP semi upright view at 8472 hours. Endotracheal tube tip
in good position just above the clavicles. Stable left chest tube.
Right IJ Swan-Ganz catheter remains in place with tip at the main
pulmonary artery level. Partially visible mediastinal tube. Enteric
tube courses toward the abdomen, tip not included.

Mildly improved lung volumes. No pneumothorax or pulmonary edema.
Decreased perihilar but continued left lung base opacity compatible
with atelectasis. Mediastinal contour within normal limits.
IMPRESSION: 1.  Stable lines and tubes.
2. No pneumothorax or pulmonary edema.  Left lung base atelectasis.

## 2020-06-27 IMAGING — CR DG CHEST 1V PORT
1 series · 1 of 1 positions shown · non-contrast
Comparison: 10/02/2018

CLINICAL DATA: Status post CABG

EXAM:
PORTABLE CHEST 1 VIEW

[AP]
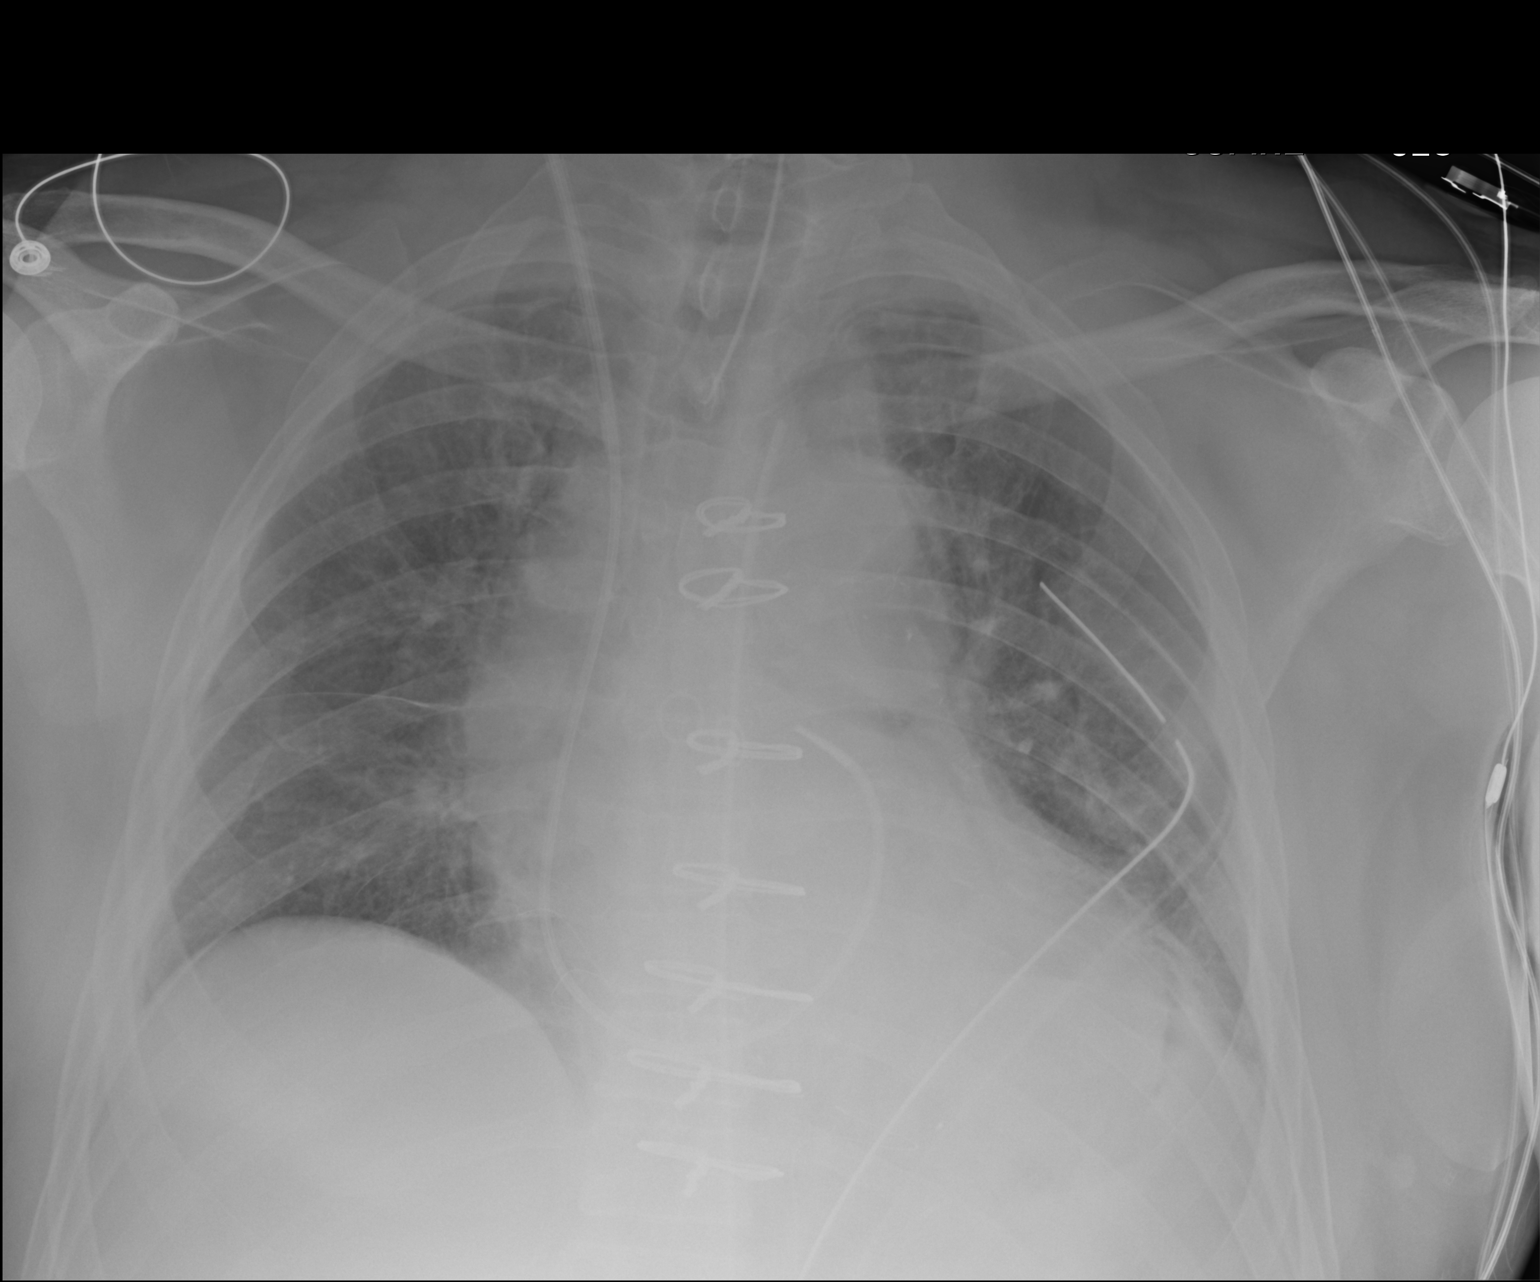

[1 of 1 positions shown; findings below may reference images not displayed]

FINDINGS: Endotracheal tube with the tip 5.6 cm above the carina. Swan-Ganz
catheter with the tip projecting over the right ventricular outflow
tract. Mediastinal drains are noted.

Interval CABG. Bilateral mild interstitial thickening. Left basilar
atelectasis with trace left pleural effusion. No pneumothorax.
Left-sided chest tube in satisfactory position. Stable
cardiomediastinal silhouette.
IMPRESSION: 1. Endotracheal tube with the tip 5.6 cm above the carina.
2. Swan-Ganz catheter with the tip projecting over the right
ventricular outflow tract.
3. Mild bilateral interstitial thickening likely reflecting mild
interstitial edema. Interval CABG.

## 2020-06-28 IMAGING — DX DG CHEST 1V PORT
1 series · 1 of 1 positions shown · non-contrast
Comparison: 10/06/2018

CLINICAL DATA: Chest tube.  Status post CABG

EXAM:
PORTABLE CHEST 1 VIEW

[chest]
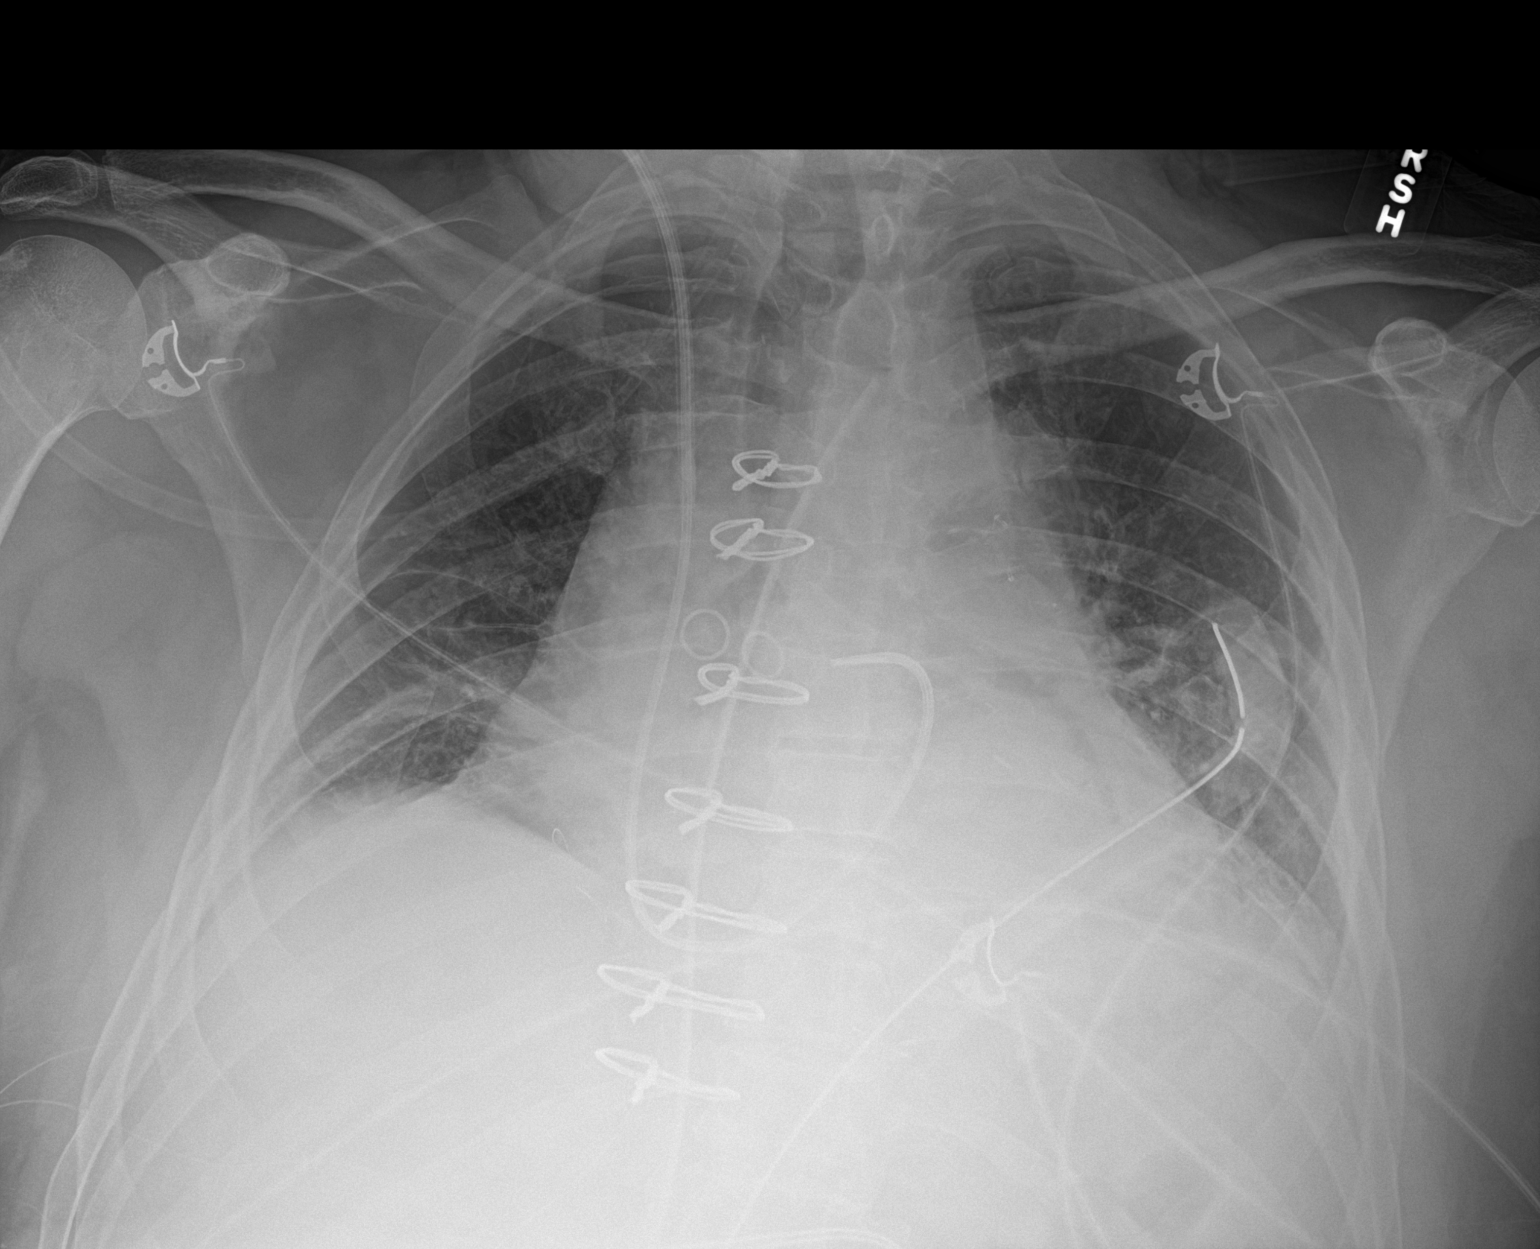

[1 of 1 positions shown; findings below may reference images not displayed]

FINDINGS: Cardiac enlargement. Status post median sternotomy. Swan-Ganz
catheter tip is in the projection of the proximal right pulmonary
artery. There is a mediastinal drain and left chest tube in place.
No significant pneumothorax identified. Atelectasis is identified
within both lung bases.
IMPRESSION: 1. Left chest tube in place without pneumothorax.
2. Bibasilar atelectasis.

## 2020-06-29 IMAGING — DX DG CHEST 1V PORT
1 series · 1 of 1 positions shown · non-contrast
Comparison: 10/07/2018

CLINICAL DATA: Short of breath

EXAM:
PORTABLE CHEST 1 VIEW

[chest]
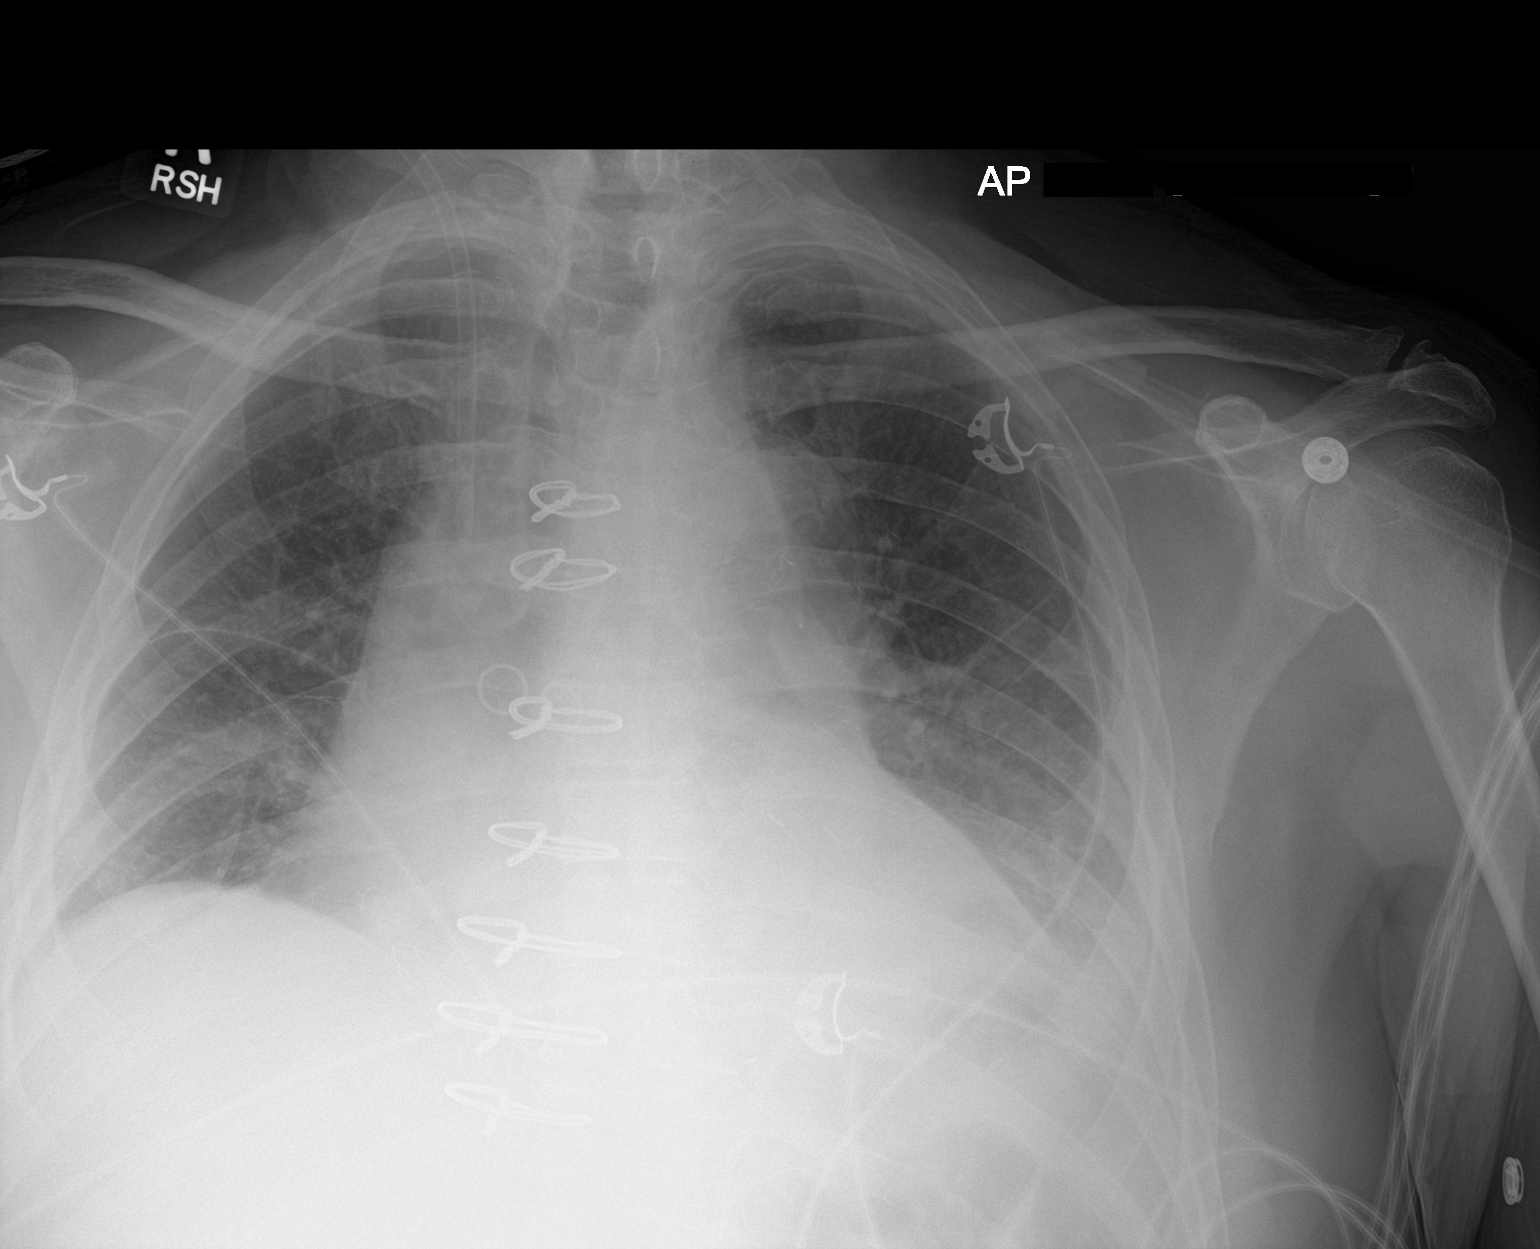

[1 of 1 positions shown; findings below may reference images not displayed]

FINDINGS: The heart is moderately enlarged. Vascular congestion. Low volumes.
Airspace disease at the left base and right basilar atelectasis are
noted. Swan-Ganz catheter removed with the right introducer left in
place. Chest and mediastinal tubes have been removed. Tiny left
apical pneumothorax is stable in retrospect. No evidence of right
pneumothorax.
IMPRESSION: Chest tubes removed with tiny left apical pneumothorax.

Low volumes with right basilar atelectasis and left basilar airspace
disease.

## 2020-06-30 IMAGING — DX DG CHEST 1V PORT
1 series · 1 of 1 positions shown · non-contrast
Comparison: 10/08/2018

CLINICAL DATA: Follow-up coronary bypass grafting

EXAM:
PORTABLE CHEST 1 VIEW

[chest]
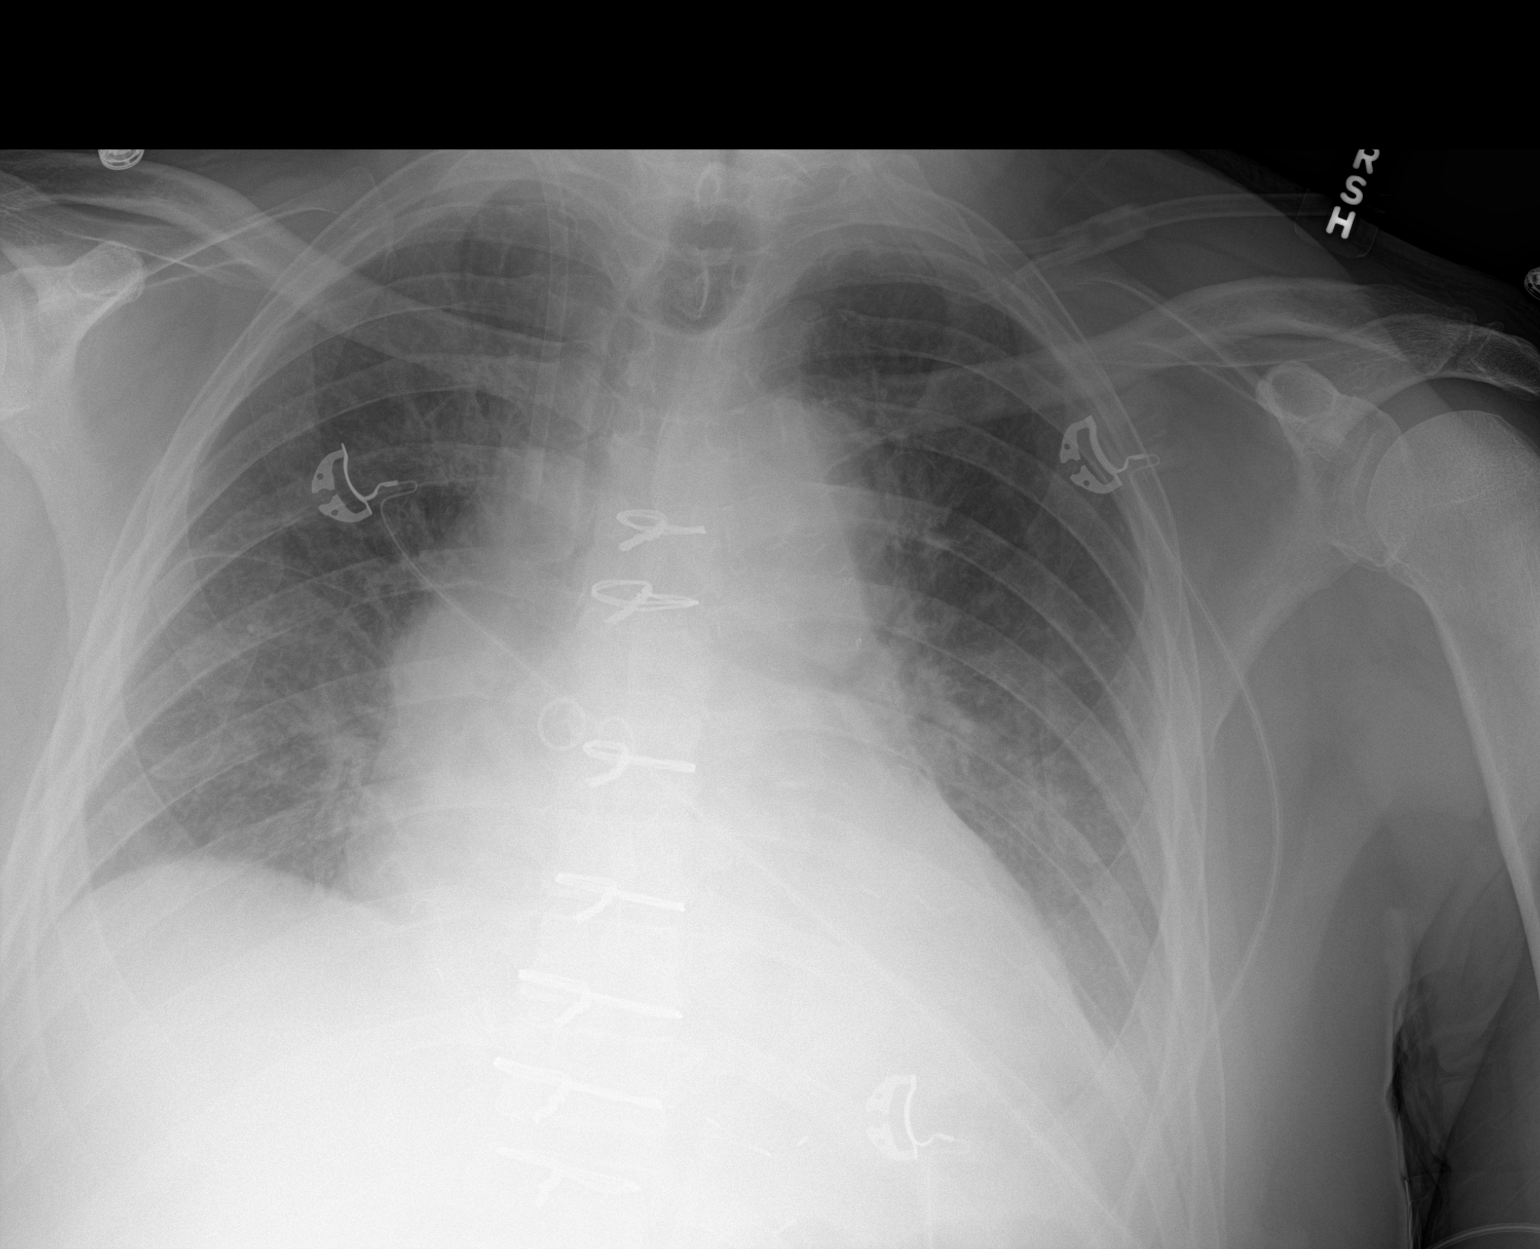

[1 of 1 positions shown; findings below may reference images not displayed]

FINDINGS: Cardiac shadow is prominent. Postsurgical changes are again seen.
Right jugular sheath is noted. Mild vascular congestion is noted
which has increased in the interval from the prior exam. Left
retrocardiac consolidation is noted. The previously seen left apical
pneumothorax is no longer identified.
IMPRESSION: Mild vascular congestion as well as left basilar consolidation

## 2020-09-21 ENCOUNTER — Other Ambulatory Visit: Payer: Self-pay | Admitting: Cardiovascular Disease

## 2020-09-21 NOTE — Telephone Encounter (Signed)
Refill Request.  

## 2020-09-22 ENCOUNTER — Ambulatory Visit: Payer: Medicare Other | Admitting: Cardiovascular Disease

## 2020-10-03 ENCOUNTER — Ambulatory Visit: Payer: Medicare Other | Admitting: Cardiovascular Disease

## 2020-10-09 NOTE — Progress Notes (Signed)
Cardiology Office Note   Date:  10/10/2020   ID:  David [name], DOB October 25, 1951, MRN [id number]  PCP:  Orpah Melter, MD  Cardiologist: Dr. Sallyanne Kuster  Chief Complaint  Patient presents with  . Follow-up    6 months.      History of Present Illness: David [name] is a 69 y.o. male who is here today for follow-up visit regarding peripheral arterial disease.   The patient has known history of coronary artery disease status post non-STEMI in October 2019.  Cardiac catheterization showed significant three-vessel coronary artery disease which was treated with CABG.  Echo showed normal LV systolic function.  Other chronic medical conditions include essential hypertension with possible whitecoat syndrome, hyperlipidemia and remote history of tobacco use.  He is not diabetic. He has been followed for right calf claudication.  Previous noninvasive vascular evaluation showed mildly reduced ABI on the right with severe popliteal artery stenosis. During last visit, I increased cilostazol to 100 mg twice daily.  He reports significant improvement in symptoms.  His right calf claudication is minimal at this point and not lifestyle limiting.  No chest pain or shortness of breath.   Past Medical History:  Diagnosis Date  . Dyslipidemia   . Essential hypertension   . S/P CABG x 4     Past Surgical History:  Procedure Laterality Date  . CORONARY ARTERY BYPASS GRAFT N/A 10/06/2018   Procedure: CORONARY ARTERY BYPASS GRAFTING (CABG) x four, using left internal mammary artery and right leg greater saphenous vein harvested endoscopically;  Surgeon: Ivin Poot, MD;  Location: [facility];  Service: Open Heart Surgery;  Laterality: N/A;  . LEFT HEART CATH AND CORONARY ANGIOGRAPHY N/A 10/02/2018   Procedure: LEFT HEART CATH AND CORONARY ANGIOGRAPHY;  Surgeon: Belva Crome, MD;  Location: [facility];  Service: Cardiovascular;  Laterality: N/A;  . TEE WITHOUT CARDIOVERSION N/A 10/06/2018    Procedure: TRANSESOPHAGEAL ECHOCARDIOGRAM (TEE);  Surgeon: Prescott Gum, Collier Salina, MD;  Location: [facility];  Service: Open Heart Surgery;  Laterality: N/A;     Current Outpatient Medications  Medication Sig Dispense Refill  . amLODipine (NORVASC) 5 MG tablet Take 5 mg by mouth daily.     Marland Kitchen aspirin EC 81 MG tablet Take 81 mg by mouth daily.    Marland Kitchen atorvastatin (LIPITOR) 80 MG tablet Take 40 mg by mouth daily. Pt takes 40 mg  a day    . cilostazol (PLETAL) 100 MG tablet TAKE 1 TABLET BY MOUTH TWICE A DAY 180 tablet 2  . metoprolol tartrate (LOPRESSOR) 25 MG tablet TAKE 1 TABLET BY MOUTH TWO  TIMES DAILY 180 tablet 2  . tamsulosin (FLOMAX) 0.4 MG CAPS capsule TAKE 1 CAPSULE BY MOUTH  DAILY 90 capsule 2  . nitroGLYCERIN (NITROSTAT) 0.4 MG SL tablet Place 1 tablet (0.4 mg total) under the tongue every 5 (five) minutes as needed for chest pain. 25 tablet 3   No current facility-administered medications for this visit.    Allergies:   Patient has no known allergies.    Social History:  The patient  reports that he has never smoked. He has never used smokeless tobacco.   Family History:  The patient's family history includes Heart attack (age of onset: 32) in his brother; Heart attack (age of onset: 68) in his mother.    ROS:  Please see the history of present illness.   Otherwise, review of systems are positive for none.   All other systems are reviewed and  negative.    PHYSICAL EXAM: VS:  BP (!) 148/86 (BP Location: Left Arm, Patient Position: Sitting, Cuff Size: Large)   Pulse (!) 57   Ht 5\' 10"  (1.778 m)   Wt 232 lb (105.2 kg)   BMI 33.29 kg/m  , BMI Body mass index is 33.29 kg/m. GEN: Well nourished, well developed, in no acute distress  HEENT: normal  Neck: no JVD, carotid bruits, or masses Cardiac: RRR; no murmurs, rubs, or gallops,no edema  Respiratory:  clear to auscultation bilaterally, normal work of breathing GI: soft, nontender, nondistended, + BS MS: no deformity or atrophy    Skin: warm and dry, no rash Neuro:  Strength and sensation are intact Psych: euthymic mood, full affect    EKG:  EKG is ordered today. EKG showed sinus bradycardia with nonspecific ST changes.   Recent Labs: No results found for requested labs within last 8760 hours.    Lipid Panel    Component Value Date/Time   CHOL 109 12/21/2018 0810   TRIG 61 12/21/2018 0810   HDL 38 (L) 12/21/2018 0810   CHOLHDL 2.9 12/21/2018 0810   CHOLHDL 4.1 10/02/2018 0830   VLDL 15 10/02/2018 0830   LDLCALC 59 12/21/2018 0810      Wt Readings from Last 3 Encounters:  10/10/20 232 lb (105.2 kg)  03/28/20 224 lb 9.6 oz (101.9 kg)  12/28/19 218 lb 6.4 oz (99.1 kg)      No flowsheet data found.    ASSESSMENT AND PLAN:  1.  Peripheral arterial disease: Moderate right calf claudication which improved with a walking program and small dose cilostazol.  His symptoms are currently not lifestyle limiting.  Recommend continuing medical therapy.  We will reserve angiography for worsening symptoms.  2.  Coronary artery disease involving native coronary arteries without angina: Continue medical therapy.  3.  Essential hypertension with possible whitecoat syndrome: He reports that his home blood pressure readings are almost always normal.  Continue to monitor for now.  4.  Hyperlipidemia: Continue treatment with atorvastatin.  Most recent lipid profile showed an LDL of 59.    Disposition:   FU with me in 12 months  Signed,  Kathlyn Sacramento, MD  10/10/2020 8:17 AM    [place]

## 2020-10-10 ENCOUNTER — Ambulatory Visit: Payer: Medicare Other | Admitting: Cardiovascular Disease

## 2020-10-10 ENCOUNTER — Other Ambulatory Visit: Payer: Self-pay

## 2020-10-10 VITALS — BP 148/86 | HR 57 | Ht 70.0 in | Wt 232.0 lb

## 2020-10-10 DIAGNOSIS — I10 Essential (primary) hypertension: Secondary | ICD-10-CM | POA: Diagnosis not present

## 2020-10-10 DIAGNOSIS — E785 Hyperlipidemia, unspecified: Secondary | ICD-10-CM | POA: Diagnosis not present

## 2020-10-10 DIAGNOSIS — I2510 Atherosclerotic heart disease of native coronary artery without angina pectoris: Secondary | ICD-10-CM | POA: Diagnosis not present

## 2020-10-10 DIAGNOSIS — I739 Peripheral vascular disease, unspecified: Secondary | ICD-10-CM | POA: Diagnosis not present

## 2020-10-10 NOTE — Patient Instructions (Signed)

## 2020-11-15 ENCOUNTER — Other Ambulatory Visit: Payer: Self-pay

## 2020-11-15 ENCOUNTER — Encounter: Payer: Self-pay | Admitting: Cardiovascular Disease

## 2020-11-15 ENCOUNTER — Ambulatory Visit: Payer: Medicare Other | Admitting: Cardiovascular Disease

## 2020-11-15 VITALS — BP 170/95 | HR 54 | Ht 70.0 in | Wt 232.8 lb

## 2020-11-15 DIAGNOSIS — E7800 Pure hypercholesterolemia, unspecified: Secondary | ICD-10-CM

## 2020-11-15 DIAGNOSIS — I739 Peripheral vascular disease, unspecified: Secondary | ICD-10-CM | POA: Diagnosis not present

## 2020-11-15 DIAGNOSIS — I2510 Atherosclerotic heart disease of native coronary artery without angina pectoris: Secondary | ICD-10-CM

## 2020-11-15 DIAGNOSIS — I4891 Unspecified atrial fibrillation: Secondary | ICD-10-CM

## 2020-11-15 DIAGNOSIS — I10 Essential (primary) hypertension: Secondary | ICD-10-CM | POA: Diagnosis not present

## 2020-11-15 DIAGNOSIS — I9789 Other postprocedural complications and disorders of the circulatory system, not elsewhere classified: Secondary | ICD-10-CM | POA: Diagnosis not present

## 2020-11-15 MED ORDER — AMLODIPINE BESYLATE 10 MG PO TABS
10.0000 mg | ORAL_TABLET | Freq: Every day | ORAL | 3 refills | Status: DC
Start: 2020-11-15 — End: 2021-09-19

## 2020-11-15 NOTE — Patient Instructions (Signed)
Medication Instructions:  INCREASE the Amlodipine to 10 mg once daily  *If you need a refill on your cardiac medications before your next appointment, please call your pharmacy*   Lab Work: Please send labs when you have them done with your PCP  If you have labs (blood work) drawn today and your tests are completely normal, you will receive your results only by: Marland Kitchen MyChart Message (if you have MyChart) OR . A paper copy in the mail If you have any lab test that is abnormal or we need to change your treatment, we will call you to review the results.   Testing/Procedures: None ordered   Follow-Up: At [facility], you and your health needs are our priority.  As part of our continuing mission to provide you with exceptional heart care, we have created designated Provider Care Teams.  These Care Teams include your primary Cardiologist (physician) and Advanced Practice Providers (APPs -  Physician Assistants and Nurse Practitioners) who all work together to provide you with the care you need, when you need it.  We recommend signing up for the patient portal called "MyChart".  Sign up information is provided on this After Visit Summary.  MyChart is used to connect with patients for Virtual Visits (Telemedicine).  Patients are able to view lab/test results, encounter notes, upcoming appointments, etc.  Non-urgent messages can be sent to your provider as well.   To learn more about what you can do with MyChart, go to [email or web address].    Your next appointment:   12 month(s)  The format for your next appointment:   In Person  Provider:   You may see Dr. Sallyanne Kuster or one of the following Advanced Practice Providers on your designated Care Team:    Almyra Deforest, PA-C  Fabian Sharp, [place] or   Roby Lofts, [place]    Other Instructions Dr. Sallyanne Kuster would like you to check your blood pressure daily for the next 2 weeks.  Keep a journal of these daily blood pressure and heart rate  readings and call our office or send a message through [facility] with the results. Thank you!  It is best to check your BP 1-2 hours after taking your medications to see the medications effectiveness on your BP.    Here are some tips that our clinical pharmacists share for home BP monitoring:          Rest 5 minutes before taking your blood pressure.          Don't smoke or drink caffeinated beverages for at least 30 minutes before.          Take your blood pressure before (not after) you eat.          Sit comfortably with your back supported and both feet on the floor (don't cross your legs).          Elevate your arm to heart level on a table or a desk.          Use the proper sized cuff. It should fit smoothly and snugly around your bare upper arm. There should be enough room to slip a fingertip under the cuff. The bottom edge of the cuff should be 1 inch above the crease of the elbow.

## 2020-11-15 NOTE — Progress Notes (Signed)
Cardiology Office Note:    Date:  11/17/2020   ID:  David [name], DOB 1951/05/04, MRN [id number]  PCP:  Orpah Melter, MD  Cardiologist:  Kathlyn Sacramento, MD  Electrophysiologist:  None   Referring MD: Orpah Melter, MD   Chief Complaint  Patient presents with  . Coronary Artery Disease  . PAD    History of Present Illness:    David [name] is a 69 y.o. male who presented with a small non-STEMI in October 2019, was found to have multivessel coronary artery disease with total occlusion of the right coronary artery and high-grade stenosis of the LAD and large diagonal branch, subsequently undergoing four-vessel bypass surgery (LIMA to LAD, SVG to diagonal, sequential SVG to PLA and PDA, Dr. Prescott Gum, October 06, 2018).  Surgery was complicated by transient postoperative atrial fibrillation.  He took amiodarone briefly, but was never prescribed anticoagulants.  He has preserved left ventricular systolic function, with inferior wall hypokinesis.  He has hypertension, hyperlipidemia and quit smoking many years ago.  The patient specifically denies any chest pain at rest or exertion, dyspnea at rest or with usual exertion, orthopnea, paroxysmal nocturnal dyspnea, syncope, palpitations, focal neurological deficits, lower extremity edema, unexplained weight gain, cough, hemoptysis or wheezing. He rarely has intermittent claudication, more often is limited by dyspnea for example when walking uphill.  He has severe right popliteal stenosis but has had remarkable improvement in his claudication after starting cilostazol at the recommendations of Dr. Fletcher Anon last month.  He can walk on a flat course at the [facility] for 30 minutes without any complaints of dyspnea or claudication, but if he walks up hills he becomes winded.  He is having very frequent PACs today.  His blood pressure readings are quite erratic because of the irregular rhythm.  I checked it several times myself and his systolic is between  119 and 170 while the diastolic is between 68 and 95.  I would guess that his actual blood pressure is around 150/80 on the average.  Past Medical History:  Diagnosis Date  . Dyslipidemia   . Essential hypertension   . S/P CABG x 4     Past Surgical History:  Procedure Laterality Date  . CORONARY ARTERY BYPASS GRAFT N/A 10/06/2018   Procedure: CORONARY ARTERY BYPASS GRAFTING (CABG) x four, using left internal mammary artery and right leg greater saphenous vein harvested endoscopically;  Surgeon: Ivin Poot, MD;  Location: [facility];  Service: Open Heart Surgery;  Laterality: N/A;  . LEFT HEART CATH AND CORONARY ANGIOGRAPHY N/A 10/02/2018   Procedure: LEFT HEART CATH AND CORONARY ANGIOGRAPHY;  Surgeon: Belva Crome, MD;  Location: [facility];  Service: Cardiovascular;  Laterality: N/A;  . TEE WITHOUT CARDIOVERSION N/A 10/06/2018   Procedure: TRANSESOPHAGEAL ECHOCARDIOGRAM (TEE);  Surgeon: Prescott Gum, Collier Salina, MD;  Location: [facility];  Service: Open Heart Surgery;  Laterality: N/A;    Current Medications: Current Meds  Medication Sig  . amLODipine (NORVASC) 10 MG tablet Take 1 tablet (10 mg total) by mouth daily.  Marland Kitchen aspirin EC 81 MG tablet Take 81 mg by mouth daily.  Marland Kitchen atorvastatin (LIPITOR) 80 MG tablet Take 40 mg by mouth daily. Pt takes 40 mg  a day  . cilostazol (PLETAL) 100 MG tablet TAKE 1 TABLET BY MOUTH TWICE A DAY  . metoprolol tartrate (LOPRESSOR) 25 MG tablet TAKE 1 TABLET BY MOUTH TWO  TIMES DAILY  . nitroGLYCERIN (NITROSTAT) 0.4 MG SL tablet Place 1 tablet (0.4 mg total)  under the tongue every 5 (five) minutes as needed for chest pain.  . tamsulosin (FLOMAX) 0.4 MG CAPS capsule TAKE 1 CAPSULE BY MOUTH  DAILY  . [DISCONTINUED] amLODipine (NORVASC) 5 MG tablet Take 5 mg by mouth daily.      Allergies:   Patient has no known allergies.   Social History   Socioeconomic History  . Marital status: Married    Spouse name: Not on file  . Number of children: Not on file   . Years of education: Not on file  . Highest education level: Not on file  Occupational History  . Not on file  Tobacco Use  . Smoking status: Never Smoker  . Smokeless tobacco: Never Used  Vaping Use  . Vaping Use: Unknown  Substance and Sexual Activity  . Alcohol use: Not on file  . Drug use: Not on file  . Sexual activity: Not on file  Other Topics Concern  . Not on file  Social History Narrative  . Not on file   Social Determinants of Health   Financial Resource Strain:   . Difficulty of Paying Living Expenses: Not on file  Food Insecurity:   . Worried About Charity fundraiser in the Last Year: Not on file  . Ran Out of Food in the Last Year: Not on file  Transportation Needs:   . Lack of Transportation (Medical): Not on file  . Lack of Transportation (Non-Medical): Not on file  Physical Activity:   . Days of Exercise per Week: Not on file  . Minutes of Exercise per Session: Not on file  Stress:   . Feeling of Stress : Not on file  Social Connections:   . Frequency of Communication with Friends and Family: Not on file  . Frequency of Social Gatherings with Friends and Family: Not on file  . Attends Religious Services: Not on file  . Active Member of Clubs or Organizations: Not on file  . Attends Archivist Meetings: Not on file  . Marital Status: Not on file     Family History: The patient's family history includes Heart attack (age of onset: 74) in his brother; Heart attack (age of onset: 57) in his mother.  ROS:   Please see the history of present illness.    All other systems are reviewed and are negative.   EKGs/Labs/Other Studies Reviewed:    The following studies were reviewed today: Notes from Dr. Velva Harman  EKG:  EKG is ordered today.  It shows sinus rhythm with first-degree atrioventricular block and frequent PACs, minor ST-T wave changes in 1 and aVL  Recent Labs: No results found for requested labs within last 8760 hours.  Recent Lipid  Panel    Component Value Date/Time   CHOL 109 12/21/2018 0810   TRIG 61 12/21/2018 0810   HDL 38 (L) 12/21/2018 0810   CHOLHDL 2.9 12/21/2018 0810   CHOLHDL 4.1 10/02/2018 0830   VLDL 15 10/02/2018 0830   LDLCALC 59 12/21/2018 0810  05/25/2019  total cholesterol 122, HDL 39, LDL 71, triglycerides 57  Physical Exam:    VS:  BP (!) 170/95   Pulse (!) 54   Ht 5\' 10"  (1.778 m)   Wt 232 lb 12.8 oz (105.6 kg)   SpO2 97%   BMI 33.40 kg/m     Wt Readings from Last 3 Encounters:  11/15/20 232 lb 12.8 oz (105.6 kg)  10/10/20 232 lb (105.2 kg)  03/28/20 224 lb 9.6 oz (101.9 kg)  General: Alert, oriented x3, no distress, mildly obese.  Well-healed sternotomy scar Head: no evidence of trauma, PERRL, EOMI, no exophtalmos or lid lag, no myxedema, no xanthelasma; normal ears, nose and oropharynx Neck: normal jugular venous pulsations and no hepatojugular reflux; brisk carotid pulses without delay and no carotid bruits Chest: clear to auscultation, no signs of consolidation by percussion or palpation, normal fremitus, symmetrical and full respiratory excursions Cardiovascular: normal position and quality of the apical impulse, regular rhythm, normal first and second heart sounds, no murmurs, rubs or gallops Abdomen: no tenderness or distention, no masses by palpation, no abnormal pulsatility or arterial bruits, normal bowel sounds, no hepatosplenomegaly Extremities: no clubbing, cyanosis or edema; 2+ radial, ulnar and brachial pulses bilaterally; 2+ right femoral, 1+ right posterior tibial and dorsalis pedis pulses; 2+ left femoral, posterior tibial and dorsalis pedis pulses; no subclavian or femoral bruits Neurological: grossly nonfocal Psych: Normal mood and affect   ASSESSMENT:    1. Coronary artery disease involving native coronary artery of native heart without angina pectoris   2. PAD (peripheral artery disease) ([place])   3. Postoperative atrial fibrillation (HCC)   4.  Hypercholesterolemia   5. Essential hypertension    PLAN:    In order of problems listed above:  1. CAD s/p CABG: He does not have angina pectoris even though he is physically active.  Continue aspirin, statin, beta-blocker 2. PAD: Minimally symptomatic after adding cilostazol. 3. AFib: Occurred only postoperatively and briefly.  Not on anticoagulants. 4. HLP: Target LDL cholesterol less than 70.  Time to recheck labs. 5. HTN: Getting an accurate reading of his blood pressure is difficult due to the very frequent ectopic beats, but I think his blood pressure still clearly high.  We will increase his amlodipine to 10 mg daily since this would at least theoretically offer most benefit for his PAD.  He is unlikely to tolerate higher doses of beta-blocker due to resting sinus bradycardia.   Medication Adjustments/Labs and Tests Ordered: Current medicines are reviewed at length with the patient today.  Concerns regarding medicines are outlined above.  Orders Placed This Encounter  Procedures  . EKG 12-Lead   Meds ordered this encounter  Medications  . amLODipine (NORVASC) 10 MG tablet    Sig: Take 1 tablet (10 mg total) by mouth daily.    Dispense:  90 tablet    Refill:  3    Patient Instructions  Medication Instructions:  INCREASE the Amlodipine to 10 mg once daily  *If you need a refill on your cardiac medications before your next appointment, please call your pharmacy*   Lab Work: Please send labs when you have them done with your PCP  If you have labs (blood work) drawn today and your tests are completely normal, you will receive your results only by: Marland Kitchen MyChart Message (if you have MyChart) OR . A paper copy in the mail If you have any lab test that is abnormal or we need to change your treatment, we will call you to review the results.   Testing/Procedures: None ordered   Follow-Up: At [facility], you and your health needs are our priority.  As part of our  continuing mission to provide you with exceptional heart care, we have created designated Provider Care Teams.  These Care Teams include your primary Cardiologist (physician) and Advanced Practice Providers (APPs -  Physician Assistants and Nurse Practitioners) who all work together to provide you with the care you need, when you need it.  We recommend signing up for the patient portal called "MyChart".  Sign up information is provided on this After Visit Summary.  MyChart is used to connect with patients for Virtual Visits (Telemedicine).  Patients are able to view lab/test results, encounter notes, upcoming appointments, etc.  Non-urgent messages can be sent to your provider as well.   To learn more about what you can do with MyChart, go to [email or web address].    Your next appointment:   12 month(s)  The format for your next appointment:   In Person  Provider:   You may see Dr. Sallyanne Kuster or one of the following Advanced Practice Providers on your designated Care Team:    Almyra Deforest, PA-C  Fabian Sharp, [place] or   Roby Lofts, [place]    Other Instructions Dr. Sallyanne Kuster would like you to check your blood pressure daily for the next 2 weeks.  Keep a journal of these daily blood pressure and heart rate readings and call our office or send a message through [facility] with the results. Thank you!  It is best to check your BP 1-2 hours after taking your medications to see the medications effectiveness on your BP.    Here are some tips that our clinical pharmacists share for home BP monitoring:          Rest 5 minutes before taking your blood pressure.          Don't smoke or drink caffeinated beverages for at least 30 minutes before.          Take your blood pressure before (not after) you eat.          Sit comfortably with your back supported and both feet on the floor (don't cross your legs).          Elevate your arm to heart level on a table or a desk.          Use the proper  sized cuff. It should fit smoothly and snugly around your bare upper arm. There should be enough room to slip a fingertip under the cuff. The bottom edge of the cuff should be 1 inch above the crease of the elbow.      Signed, Sanda Klein, MD  11/17/2020 2:58 PM    [place]

## 2020-11-17 ENCOUNTER — Encounter: Payer: Self-pay | Admitting: Cardiovascular Disease

## 2020-12-12 ENCOUNTER — Other Ambulatory Visit: Payer: Self-pay | Admitting: *Deleted

## 2020-12-12 MED ORDER — HYDROCHLOROTHIAZIDE 12.5 MG PO CAPS: 12.5000 mg | ORAL_CAPSULE | ORAL | 3 refills | Status: DC

## 2020-12-13 ENCOUNTER — Other Ambulatory Visit: Payer: Self-pay | Admitting: Cardiovascular Disease

## 2021-01-22 MED ORDER — HYDROCHLOROTHIAZIDE 12.5 MG PO CAPS: 12.5000 mg | ORAL_CAPSULE | Freq: Every day | ORAL | 3 refills | Status: DC

## 2021-01-26 DIAGNOSIS — I2510 Atherosclerotic heart disease of native coronary artery without angina pectoris: Secondary | ICD-10-CM | POA: Diagnosis not present

## 2021-01-26 DIAGNOSIS — I10 Essential (primary) hypertension: Secondary | ICD-10-CM | POA: Diagnosis not present

## 2021-01-26 DIAGNOSIS — Z0000 Encounter for general adult medical examination without abnormal findings: Secondary | ICD-10-CM | POA: Diagnosis not present

## 2021-01-26 DIAGNOSIS — E7800 Pure hypercholesterolemia, unspecified: Secondary | ICD-10-CM | POA: Diagnosis not present

## 2021-01-26 DIAGNOSIS — I739 Peripheral vascular disease, unspecified: Secondary | ICD-10-CM | POA: Diagnosis not present

## 2021-01-30 ENCOUNTER — Encounter: Payer: Self-pay | Admitting: Dermatology

## 2021-01-30 ENCOUNTER — Ambulatory Visit: Payer: Medicare Other | Admitting: Dermatology

## 2021-01-30 ENCOUNTER — Other Ambulatory Visit: Payer: Self-pay

## 2021-01-30 DIAGNOSIS — D485 Neoplasm of uncertain behavior of skin: Secondary | ICD-10-CM

## 2021-01-30 DIAGNOSIS — Z85828 Personal history of other malignant neoplasm of skin: Secondary | ICD-10-CM

## 2021-01-30 DIAGNOSIS — Z85820 Personal history of malignant melanoma of skin: Secondary | ICD-10-CM | POA: Diagnosis not present

## 2021-01-30 DIAGNOSIS — D1800 Hemangioma unspecified site: Secondary | ICD-10-CM

## 2021-01-30 DIAGNOSIS — C4491 Basal cell carcinoma of skin, unspecified: Secondary | ICD-10-CM

## 2021-01-30 DIAGNOSIS — L570 Actinic keratosis: Secondary | ICD-10-CM | POA: Diagnosis not present

## 2021-01-30 DIAGNOSIS — C44311 Basal cell carcinoma of skin of nose: Secondary | ICD-10-CM | POA: Diagnosis not present

## 2021-01-30 DIAGNOSIS — L821 Other seborrheic keratosis: Secondary | ICD-10-CM | POA: Diagnosis not present

## 2021-01-30 DIAGNOSIS — Z1283 Encounter for screening for malignant neoplasm of skin: Secondary | ICD-10-CM | POA: Diagnosis not present

## 2021-01-30 HISTORY — DX: Basal cell carcinoma of skin, unspecified: C44.91

## 2021-01-30 NOTE — Patient Instructions (Signed)

## 2021-01-31 MED ORDER — ISOSORBIDE MONONITRATE ER 30 MG PO TB24: 30.0000 mg | ORAL_TABLET | Freq: Every day | ORAL | 3 refills | Status: DC

## 2021-02-06 ENCOUNTER — Telehealth: Payer: Self-pay | Admitting: *Deleted

## 2021-02-06 ENCOUNTER — Encounter: Payer: Self-pay | Admitting: *Deleted

## 2021-02-06 NOTE — Telephone Encounter (Signed)
Pathology to patient- referral done to skin surgery center for MOHS per Dr Denna Haggard

## 2021-02-08 DIAGNOSIS — H524 Presbyopia: Secondary | ICD-10-CM | POA: Diagnosis not present

## 2021-02-08 DIAGNOSIS — H40053 Ocular hypertension, bilateral: Secondary | ICD-10-CM | POA: Diagnosis not present

## 2021-02-09 ENCOUNTER — Encounter: Payer: Self-pay | Admitting: Dermatology

## 2021-02-09 NOTE — Progress Notes (Signed)
   Follow-Up Visit   Subjective  David [name] is a 70 y.o. male who presents for the following: Annual Exam (No real concerns- wants spot on nose check and spot on back checked).  New growth right nostril, complete skin check Location:  Duration:  Quality:  Associated Signs/Symptoms: Modifying Factors:  Severity:  Timing: Context:   Objective  Well appearing patient in no apparent distress; mood and affect are within normal limits. Objective  Right lower chek: No repigmentation, no regional adenopathy, excellent cosmetic result  Objective  Left Breast: No sign recurrence  Objective  Umbilicus: Full body skin examination: No new atypical moles or melanoma.  Objective  Right Lower Back: Multiple Stuck-on, waxy, tan-brown papules and plaques. --Discussed benign etiology and prognosis.   Objective  Right Ala Nasi: Pink pearly centrally eroded 1 cm papule     Objective  Left Frontal Scalp, Mid Parietal Scalp, Right Forehead: Hornlike pink 3 to 4 mm crusts  Objective  Left Abdomen (side) - Upper: Red raised 2 mm papule   A full examination was performed including scalp, head, eyes, ears, nose, lips, neck, chest, axillae, abdomen, back, buttocks, bilateral upper extremities, bilateral lower extremities, hands, feet, fingers, toes, fingernails, and toenails. All findings within normal limits unless otherwise noted below.   Assessment & Plan    Personal history of malignant melanoma of skin Right lower chek  Yearly skin check  History of squamous cell carcinoma of skin Left Breast  Yearly skin check  Encounter for screening for malignant neoplasm of skin Umbilicus  Yearly skin check  Seborrheic keratosis Right Lower Back  Okay to leave if stable  Neoplasm of uncertain behavior of skin Right Ala Nasi  Skin / nail biopsy Type of biopsy: tangential   Informed consent: discussed and consent obtained   Timeout: patient name, date of birth,  surgical site, and procedure verified   Procedure prep:  Patient was prepped and draped in usual sterile fashion (Non sterile) Prep type:  Chlorhexidine Anesthesia: the lesion was anesthetized in a standard fashion   Anesthetic:  1% lidocaine w/ epinephrine 1-100,000 local infiltration Instrument used: flexible razor blade   Outcome: patient tolerated procedure well   Post-procedure details: wound care instructions given    Specimen 1 - Surgical pathology Differential Diagnosis: bcc vs scc Check Margins: No  Mohs surgery if biopsy confirms BCC  AK (actinic keratosis) (3) Left Frontal Scalp; Mid Parietal Scalp; Right Forehead  Destruction of lesion - Left Frontal Scalp, Mid Parietal Scalp, Right Forehead Complexity: simple   Destruction method: cryotherapy   Informed consent: discussed and consent obtained   Timeout:  patient name, date of birth, surgical site, and procedure verified Lesion destroyed using liquid nitrogen: Yes   Cryotherapy cycles:  3 Outcome: patient tolerated procedure well with no complications    Hemangioma, unspecified site Left Abdomen (side) - Upper  Okay to leave      I, Lavonna Monarch, MD, have reviewed all documentation for this visit.  The documentation on 02/09/21 for the exam, diagnosis, procedures, and orders are all accurate and complete.

## 2021-02-12 DIAGNOSIS — Z951 Presence of aortocoronary bypass graft: Secondary | ICD-10-CM

## 2021-02-12 DIAGNOSIS — I25118 Atherosclerotic heart disease of native coronary artery with other forms of angina pectoris: Secondary | ICD-10-CM

## 2021-02-19 NOTE — Telephone Encounter (Signed)
Lexiscan Myoview ordered to assess chest pain.   Shared Decision Making/Informed Consent The risks [chest pain, shortness of breath, cardiac arrhythmias, dizziness, blood pressure fluctuations, myocardial infarction, stroke/transient ischemic attack, nausea, vomiting, allergic reaction, radiation exposure, metallic taste sensation and life-threatening complications (estimated to be 1 in 10,000)], benefits (risk stratification, diagnosing coronary artery disease, treatment guidance) and alternatives of a nuclear stress test were discussed in detail with Mr. Nolasco and he agrees to proceed.  Glenetta Hew, MD

## 2021-02-20 DIAGNOSIS — H25013 Cortical age-related cataract, bilateral: Secondary | ICD-10-CM | POA: Diagnosis not present

## 2021-02-20 DIAGNOSIS — H40023 Open angle with borderline findings, high risk, bilateral: Secondary | ICD-10-CM | POA: Diagnosis not present

## 2021-02-22 ENCOUNTER — Encounter (HOSPITAL_COMMUNITY): Payer: Medicare Other

## 2021-02-22 ENCOUNTER — Encounter (HOSPITAL_COMMUNITY): Payer: Self-pay

## 2021-02-24 NOTE — Telephone Encounter (Signed)
I see the order, but has this been scheduled yet?

## 2021-03-12 ENCOUNTER — Telehealth (HOSPITAL_COMMUNITY): Payer: Self-pay

## 2021-03-12 NOTE — Telephone Encounter (Signed)
Detailed instructions left on the patient's answering machine. Asked to call back with any questions. S.Lyden Redner EMTP 

## 2021-03-13 ENCOUNTER — Ambulatory Visit (HOSPITAL_COMMUNITY): Payer: Medicare Other | Attending: Cardiovascular Disease

## 2021-03-13 ENCOUNTER — Other Ambulatory Visit: Payer: Self-pay

## 2021-03-13 DIAGNOSIS — I25118 Atherosclerotic heart disease of native coronary artery with other forms of angina pectoris: Secondary | ICD-10-CM

## 2021-03-13 DIAGNOSIS — Z951 Presence of aortocoronary bypass graft: Secondary | ICD-10-CM | POA: Diagnosis not present

## 2021-03-13 LAB — MYOCARDIAL PERFUSION IMAGING
LV dias vol: 81 mL (ref 62–150)
LV sys vol: 26 mL
Peak HR: 76 {beats}/min
Rest HR: 58 {beats}/min
SDS: 2
SRS: 0
SSS: 2
TID: 0.88

## 2021-03-13 MED ORDER — REGADENOSON 0.4 MG/5ML IV SOLN
0.4000 mg | Freq: Once | INTRAVENOUS | Status: AC
Start: 2021-03-13 — End: 2021-03-13
  Administered 2021-03-13: 0.4 mg via INTRAVENOUS

## 2021-03-13 MED ORDER — TECHNETIUM TC 99M TETROFOSMIN IV KIT
31.9000 | PACK | Freq: Once | INTRAVENOUS | Status: AC | PRN
  Administered 2021-03-13: 31.9 via INTRAVENOUS
  Filled 2021-03-13: qty 32

## 2021-03-13 MED ORDER — TECHNETIUM TC 99M TETROFOSMIN IV KIT
10.5000 | PACK | Freq: Once | INTRAVENOUS | Status: AC | PRN
  Administered 2021-03-13: 10.5 via INTRAVENOUS
  Filled 2021-03-13: qty 11

## 2021-04-18 NOTE — Telephone Encounter (Signed)
Hi David [name], I am very pleased that you are staying up-to-date and continue to take a very active interest in your own health. However, the recent clinical trial and the new guidelines regarding aspirin do not apply to you.  The recommendation is to avoid taking aspirin for "primary prevention" (patients who have never had a heart attack or stents or bypass or stroke, etc.).  Since these folks are at a low risk for heart attack or stroke to begin with, the benefit is canceled by an increased risk of serious bleeding. Your situation is different.  You are taking aspirin for "secondary prevention".  Since you already have significant established vascular problems, your likelihood of future clot-related events such as heart attacks and strokes is higher and greatly outweighs the bleeding risk. Unless you have bleeding problems (or until we have new solid science-based data), you should continue taking aspirin 81 mg daily. I was also very happy to see that your stress test showed very favorable results. Best wishes, Dr.C

## 2021-04-19 ENCOUNTER — Other Ambulatory Visit: Payer: Self-pay | Admitting: Cardiovascular Disease

## 2021-05-02 DIAGNOSIS — C44311 Basal cell carcinoma of skin of nose: Secondary | ICD-10-CM | POA: Diagnosis not present

## 2021-05-15 DIAGNOSIS — I10 Essential (primary) hypertension: Secondary | ICD-10-CM | POA: Diagnosis not present

## 2021-05-15 DIAGNOSIS — E7800 Pure hypercholesterolemia, unspecified: Secondary | ICD-10-CM | POA: Diagnosis not present

## 2021-05-15 DIAGNOSIS — I2510 Atherosclerotic heart disease of native coronary artery without angina pectoris: Secondary | ICD-10-CM | POA: Diagnosis not present

## 2021-06-14 DIAGNOSIS — L905 Scar conditions and fibrosis of skin: Secondary | ICD-10-CM | POA: Diagnosis not present

## 2021-06-14 DIAGNOSIS — C44311 Basal cell carcinoma of skin of nose: Secondary | ICD-10-CM | POA: Diagnosis not present

## 2021-06-23 ENCOUNTER — Other Ambulatory Visit: Payer: Self-pay | Admitting: Cardiovascular Disease

## 2021-06-26 NOTE — Telephone Encounter (Signed)
Refill request

## 2021-07-11 NOTE — Telephone Encounter (Signed)
LVM for return call. 

## 2021-07-17 DIAGNOSIS — I2510 Atherosclerotic heart disease of native coronary artery without angina pectoris: Secondary | ICD-10-CM | POA: Diagnosis not present

## 2021-07-17 DIAGNOSIS — I10 Essential (primary) hypertension: Secondary | ICD-10-CM | POA: Diagnosis not present

## 2021-07-17 DIAGNOSIS — E7800 Pure hypercholesterolemia, unspecified: Secondary | ICD-10-CM | POA: Diagnosis not present

## 2021-07-31 DIAGNOSIS — H40053 Ocular hypertension, bilateral: Secondary | ICD-10-CM | POA: Diagnosis not present

## 2021-09-11 DIAGNOSIS — I2510 Atherosclerotic heart disease of native coronary artery without angina pectoris: Secondary | ICD-10-CM | POA: Diagnosis not present

## 2021-09-11 DIAGNOSIS — I10 Essential (primary) hypertension: Secondary | ICD-10-CM | POA: Diagnosis not present

## 2021-09-11 DIAGNOSIS — E7800 Pure hypercholesterolemia, unspecified: Secondary | ICD-10-CM | POA: Diagnosis not present

## 2021-09-18 ENCOUNTER — Other Ambulatory Visit: Payer: Self-pay | Admitting: Cardiovascular Disease

## 2021-10-04 DIAGNOSIS — Z23 Encounter for immunization: Secondary | ICD-10-CM | POA: Diagnosis not present

## 2021-10-16 ENCOUNTER — Ambulatory Visit: Payer: Medicare Other | Admitting: Cardiovascular Disease

## 2021-10-31 ENCOUNTER — Other Ambulatory Visit: Payer: Self-pay | Admitting: Cardiovascular Disease

## 2021-11-05 NOTE — Progress Notes (Signed)
Cardiology Office Note   Date:  11/06/2021   ID:  David [name], DOB March 10, 1951, MRN [id number]  PCP:  Orpah Melter, MD  Cardiologist: Dr. Sallyanne Kuster  No chief complaint on file.     History of Present Illness: David [name] is a 70 y.o. male who is here today for follow-up visit regarding peripheral arterial disease.   The patient has known history of coronary artery disease status post non-STEMI in October 2019.  Cardiac catheterization showed significant three-vessel coronary artery disease which was treated with CABG.  Echo showed normal LV systolic function.  Other chronic medical conditions include essential hypertension with possible whitecoat syndrome, hyperlipidemia and remote history of tobacco use.  He is not diabetic. He has been followed for right calf claudication.  Previous noninvasive vascular evaluation showed mildly reduced ABI on the right with severe popliteal artery stenosis. He was treated with cilostazol with gradual improvement in symptoms.  He went to [facility] in July and forgot to take cilostazol with him.  He walked and hiked for a long time without symptoms.  When he came back, he did not notice any recurrent right calf claudication and thus he did not resume the medication.  In spite of that, he reports no right calf claudication.  No chest pain or shortness of breath.   Past Medical History:  Diagnosis Date   Basal cell carcinoma 01/30/2021   infundibulocystic- right ala nasi - (MOHS)   Dyslipidemia    Essential hypertension    Melanoma ([place]) 01/14/2012   Level II- right lower cheek (MOHS)   S/P CABG x 4    Squamous cell carcinoma of skin 09/08/2014   in situ- enter fronal scalp (Txpbx)   Squamous cell carcinoma of skin 10/16/2016   in situ- left lower lip (CX35FU)    Past Surgical History:  Procedure Laterality Date   CORONARY ARTERY BYPASS GRAFT N/A 10/06/2018   Procedure: CORONARY ARTERY BYPASS GRAFTING (CABG) x four, using  left internal mammary artery and right leg greater saphenous vein harvested endoscopically;  Surgeon: Ivin Poot, MD;  Location: [facility];  Service: Open Heart Surgery;  Laterality: N/A;   LEFT HEART CATH AND CORONARY ANGIOGRAPHY N/A 10/02/2018   Procedure: LEFT HEART CATH AND CORONARY ANGIOGRAPHY;  Surgeon: Belva Crome, MD;  Location: [facility];  Service: Cardiovascular;  Laterality: N/A;   TEE WITHOUT CARDIOVERSION N/A 10/06/2018   Procedure: TRANSESOPHAGEAL ECHOCARDIOGRAM (TEE);  Surgeon: Prescott Gum, Collier Salina, MD;  Location: [facility];  Service: Open Heart Surgery;  Laterality: N/A;     Current Outpatient Medications  Medication Sig Dispense Refill   amLODipine (NORVASC) 10 MG tablet TAKE 1 TABLET BY MOUTH  DAILY 90 tablet 0   aspirin EC 81 MG tablet Take 81 mg by mouth daily.     atorvastatin (LIPITOR) 80 MG tablet Take 40 mg by mouth daily. Pt takes 40 mg  a day     isosorbide mononitrate (IMDUR) 30 MG 24 hr tablet TAKE 1 TABLET BY MOUTH EVERY DAY 90 tablet 0   metoprolol tartrate (LOPRESSOR) 25 MG tablet TAKE 1 TABLET BY MOUTH  TWICE DAILY 180 tablet 3   tamsulosin (FLOMAX) 0.4 MG CAPS capsule TAKE 1 CAPSULE BY MOUTH  DAILY 90 capsule 2   hydrochlorothiazide (MICROZIDE) 12.5 MG capsule Take 1 capsule (12.5 mg total) by mouth daily. 90 capsule 3   nitroGLYCERIN (NITROSTAT) 0.4 MG SL tablet Place 1 tablet (0.4 mg total) under the tongue every 5 (five)  minutes as needed for chest pain. 25 tablet 3   No current facility-administered medications for this visit.    Allergies:   Patient has no known allergies.    Social History:  The patient  reports that he has never smoked. He has never used smokeless tobacco.   Family History:  The patient's family history includes Heart attack (age of onset: 22) in his brother; Heart attack (age of onset: 89) in his mother.    ROS:  Please see the history of present illness.   Otherwise, review of systems are positive for none.   All other  systems are reviewed and negative.    PHYSICAL EXAM: VS:  BP 120/86   Pulse (!) 45   Ht 5\' 10"  (1.778 m)   Wt 230 lb 9.6 oz (104.6 kg)   SpO2 96%   BMI 33.09 kg/m  , BMI Body mass index is 33.09 kg/m. GEN: Well nourished, well developed, in no acute distress  HEENT: normal  Neck: no JVD, carotid bruits, or masses Cardiac: RRR; no murmurs, rubs, or gallops,no edema  Respiratory:  clear to auscultation bilaterally, normal work of breathing GI: soft, nontender, nondistended, + BS MS: no deformity or atrophy  Skin: warm and dry, no rash Neuro:  Strength and sensation are intact Psych: euthymic mood, full affect    EKG:  EKG is ordered today. EKG showed sinus bradycardia with first-degree AV block.  No significant ST or T wave changes.   Recent Labs: No results found for requested labs within last 8760 hours.    Lipid Panel    Component Value Date/Time   CHOL 109 12/21/2018 0810   TRIG 61 12/21/2018 0810   HDL 38 (L) 12/21/2018 0810   CHOLHDL 2.9 12/21/2018 0810   CHOLHDL 4.1 10/02/2018 0830   VLDL 15 10/02/2018 0830   LDLCALC 59 12/21/2018 0810      Wt Readings from Last 3 Encounters:  11/06/21 230 lb 9.6 oz (104.6 kg)  03/13/21 232 lb (105.2 kg)  11/15/20 232 lb 12.8 oz (105.6 kg)      No flowsheet data found.    ASSESSMENT AND PLAN:  1.  Peripheral arterial disease: The patient reports resolution of right calf claudication even after he stopped taking cilostazol.  I discontinued cilostazol today.  Continue healthy lifestyle changes and a walking program.    2.  Coronary artery disease involving native coronary arteries without angina: Continue medical therapy.  He is bradycardic but reports no symptoms.  Thus, I made no changes to metoprolol.  3.  Essential hypertension : Blood pressure is well controlled on current medications.  4.  Hyperlipidemia: Continue treatment with atorvastatin.  Most recent lipid profile showed an LDL of 59.  No recent labs  available to review in the last year.    Disposition: Continue to follow-up with Dr. Sallyanne Kuster on a regular basis and follow-up with me as needed.  Signed,  Kathlyn Sacramento, MD  11/06/2021 8:23 AM    [facility]

## 2021-11-06 ENCOUNTER — Other Ambulatory Visit: Payer: Self-pay

## 2021-11-06 ENCOUNTER — Encounter: Payer: Self-pay | Admitting: Cardiovascular Disease

## 2021-11-06 ENCOUNTER — Ambulatory Visit: Payer: Medicare Other | Admitting: Cardiovascular Disease

## 2021-11-06 VITALS — BP 120/86 | HR 45 | Ht 70.0 in | Wt 230.6 lb

## 2021-11-06 DIAGNOSIS — I10 Essential (primary) hypertension: Secondary | ICD-10-CM | POA: Diagnosis not present

## 2021-11-06 DIAGNOSIS — I2510 Atherosclerotic heart disease of native coronary artery without angina pectoris: Secondary | ICD-10-CM | POA: Diagnosis not present

## 2021-11-06 DIAGNOSIS — E785 Hyperlipidemia, unspecified: Secondary | ICD-10-CM

## 2021-11-06 DIAGNOSIS — I739 Peripheral vascular disease, unspecified: Secondary | ICD-10-CM | POA: Diagnosis not present

## 2021-11-06 NOTE — Patient Instructions (Signed)
Medication Instructions:  STOP cilostazol (Pletal)  *If you need a refill on your cardiac medications before your next appointment, please call your pharmacy*  Follow-Up: At [facility], you and your health needs are our priority.  As part of our continuing mission to provide you with exceptional heart care, we have created designated Provider Care Teams.  These Care Teams include your primary Cardiologist (physician) and Advanced Practice Providers (APPs -  Physician Assistants and Nurse Practitioners) who all work together to provide you with the care you need, when you need it.  We recommend signing up for the patient portal called "MyChart".  Sign up information is provided on this After Visit Summary.  MyChart is used to connect with patients for Virtual Visits (Telemedicine).  Patients are able to view lab/test results, encounter notes, upcoming appointments, etc.  Non-urgent messages can be sent to your provider as well.   To learn more about what you can do with MyChart, go to [email or web address].    Your next appointment:   AS NEEDED with Dr. Fletcher Anon

## 2021-11-15 ENCOUNTER — Other Ambulatory Visit: Payer: Self-pay | Admitting: Cardiovascular Disease

## 2021-12-12 ENCOUNTER — Other Ambulatory Visit: Payer: Self-pay | Admitting: Cardiovascular Disease

## 2021-12-12 ENCOUNTER — Ambulatory Visit: Payer: Medicare Other | Admitting: Cardiovascular Disease

## 2021-12-12 ENCOUNTER — Other Ambulatory Visit: Payer: Self-pay

## 2021-12-12 ENCOUNTER — Encounter: Payer: Self-pay | Admitting: Cardiovascular Disease

## 2021-12-12 VITALS — BP 120/74 | HR 77 | Ht 70.0 in | Wt 226.4 lb

## 2021-12-12 DIAGNOSIS — E785 Hyperlipidemia, unspecified: Secondary | ICD-10-CM

## 2021-12-12 DIAGNOSIS — I10 Essential (primary) hypertension: Secondary | ICD-10-CM | POA: Diagnosis not present

## 2021-12-12 DIAGNOSIS — I739 Peripheral vascular disease, unspecified: Secondary | ICD-10-CM

## 2021-12-12 DIAGNOSIS — I9789 Other postprocedural complications and disorders of the circulatory system, not elsewhere classified: Secondary | ICD-10-CM | POA: Diagnosis not present

## 2021-12-12 DIAGNOSIS — I2510 Atherosclerotic heart disease of native coronary artery without angina pectoris: Secondary | ICD-10-CM | POA: Diagnosis not present

## 2021-12-12 DIAGNOSIS — I4891 Unspecified atrial fibrillation: Secondary | ICD-10-CM

## 2021-12-12 NOTE — Progress Notes (Signed)
Cardiology Office Note:    Date:  12/12/2021   ID:  David [name], DOB 01-01-1951, MRN [id number]  PCP:  Orpah Melter, MD  Cardiologist:  Kathlyn Sacramento, MD  Electrophysiologist:  None   Referring MD: Orpah Melter, MD   Chief Complaint  Patient presents with   Coronary Artery Disease    History of Present Illness:    David [name] is a 70 y.o. male who presented with a small non-STEMI in October 2019, was found to have multivessel coronary artery disease with total occlusion of the right coronary artery and high-grade stenosis of the LAD and large diagonal branch, subsequently undergoing four-vessel bypass surgery (LIMA to LAD, SVG to diagonal, sequential SVG to PLA and PDA, Dr. Prescott Gum, October 06, 2018).  Surgery was complicated by transient postoperative atrial fibrillation.  He took amiodarone briefly, but was never prescribed anticoagulants.  He has preserved left ventricular systolic function, with inferior wall hypokinesis.  He has hypertension, hyperlipidemia and quit smoking many years ago.  He feels well.  He was on a recent trip to [facility] with a lot of walking and hiking and did not have claudication even though he did not take cilostazol.  Dr. Cindee Lame recommended that he can stop his medication.  He goes to a gym in [place] at least 3 times a week.  He walks 1-1/2 hours each time and does a few other exercises as well.  He does not have angina or dyspnea with activity.  He denies palpitations, dizziness, syncope or focal neurological complaints.  He does have occasional swelling in the left ankle at the end of the day (paradoxically his saphenectomy was performed from the right side).  It is never severe and always goes away by the next day.  Past Medical History:  Diagnosis Date   Basal cell carcinoma 01/30/2021   infundibulocystic- right ala nasi - (MOHS)   Dyslipidemia    Essential hypertension    Melanoma ([place]) 01/14/2012   Level II- right lower cheek  (MOHS)   S/P CABG x 4    Squamous cell carcinoma of skin 09/08/2014   in situ- enter fronal scalp (Txpbx)   Squamous cell carcinoma of skin 10/16/2016   in situ- left lower lip (CX35FU)    Past Surgical History:  Procedure Laterality Date   CORONARY ARTERY BYPASS GRAFT N/A 10/06/2018   Procedure: CORONARY ARTERY BYPASS GRAFTING (CABG) x four, using left internal mammary artery and right leg greater saphenous vein harvested endoscopically;  Surgeon: Ivin Poot, MD;  Location: [facility];  Service: Open Heart Surgery;  Laterality: N/A;   LEFT HEART CATH AND CORONARY ANGIOGRAPHY N/A 10/02/2018   Procedure: LEFT HEART CATH AND CORONARY ANGIOGRAPHY;  Surgeon: Belva Crome, MD;  Location: [facility];  Service: Cardiovascular;  Laterality: N/A;   TEE WITHOUT CARDIOVERSION N/A 10/06/2018   Procedure: TRANSESOPHAGEAL ECHOCARDIOGRAM (TEE);  Surgeon: Prescott Gum, Collier Salina, MD;  Location: [facility];  Service: Open Heart Surgery;  Laterality: N/A;    Current Medications: Current Meds  Medication Sig   amLODipine (NORVASC) 10 MG tablet TAKE 1 TABLET BY MOUTH  DAILY   aspirin EC 81 MG tablet Take 81 mg by mouth daily.   atorvastatin (LIPITOR) 80 MG tablet Take 40 mg by mouth daily. Pt takes 40 mg  a day   hydrochlorothiazide (MICROZIDE) 12.5 MG capsule Take 1 capsule (12.5 mg total) by mouth daily.   isosorbide mononitrate (IMDUR) 30 MG 24 hr tablet TAKE 1 TABLET BY MOUTH EVERY  DAY   metoprolol tartrate (LOPRESSOR) 25 MG tablet TAKE 1 TABLET BY MOUTH  TWICE DAILY   tamsulosin (FLOMAX) 0.4 MG CAPS capsule TAKE 1 CAPSULE BY MOUTH  DAILY     Allergies:   Patient has no known allergies.   Social History   Socioeconomic History   Marital status: Married    Spouse name: Not on file   Number of children: Not on file   Years of education: Not on file   Highest education level: Not on file  Occupational History   Not on file  Tobacco Use   Smoking status: Never   Smokeless tobacco: Never   Vaping Use   Vaping Use: Unknown  Substance and Sexual Activity   Alcohol use: Not on file   Drug use: Not on file   Sexual activity: Not on file  Other Topics Concern   Not on file  Social History Narrative   Not on file   Social Determinants of Health   Financial Resource Strain: Not on file  Food Insecurity: Not on file  Transportation Needs: Not on file  Physical Activity: Not on file  Stress: Not on file  Social Connections: Not on file     Family History: The patient's family history includes Heart attack (age of onset: 57) in his brother; Heart attack (age of onset: 7) in his mother.  ROS:   Please see the history of present illness.    All other systems are reviewed and are negative.   EKGs/Labs/Other Studies Reviewed:    The following studies were reviewed today: Notes from Dr. Fletcher Anon last month  EKG:  EKG is not ordered today.  The tracing from 11/06/2021 is personally reviewed and shows moderate sinus bradycardia at 45 bpm with first-degree AV block (PR 230 ms), no ischemic ST-T changes  Recent Labs: No results found for requested labs within last 8760 hours.  Recent Lipid Panel    Component Value Date/Time   CHOL 109 12/21/2018 0810   TRIG 61 12/21/2018 0810   HDL 38 (L) 12/21/2018 0810   CHOLHDL 2.9 12/21/2018 0810   CHOLHDL 4.1 10/02/2018 0830   VLDL 15 10/02/2018 0830   LDLCALC 59 12/21/2018 0810  01/26/2021 total cholesterol 115, HDL 35 LDL 64, triglycerides 77  Physical Exam:    VS:  BP 120/74    Pulse 77    Ht 5\' 10"  (1.778 m)    Wt 226 lb 6.4 oz (102.7 kg)    SpO2 97%    BMI 32.49 kg/m     Wt Readings from Last 3 Encounters:  12/12/21 226 lb 6.4 oz (102.7 kg)  11/06/21 230 lb 9.6 oz (104.6 kg)  03/13/21 232 lb (105.2 kg)      General: Alert, oriented x3, no distress, mildly obese Head: no evidence of trauma, PERRL, EOMI, no exophtalmos or lid lag, no myxedema, no xanthelasma; normal ears, nose and oropharynx Neck: normal jugular  venous pulsations and no hepatojugular reflux; brisk carotid pulses without delay and no carotid bruits Chest: clear to auscultation, no signs of consolidation by percussion or palpation, normal fremitus, symmetrical and full respiratory excursions Cardiovascular: normal position and quality of the apical impulse, regular rhythm, normal first and second heart sounds, no murmurs, rubs or gallops Abdomen: no tenderness or distention, no masses by palpation, no abnormal pulsatility or arterial bruits, normal bowel sounds, no hepatosplenomegaly Extremities: no clubbing, cyanosis or edema;  Psych: Normal mood and affect    ASSESSMENT:    1. Coronary  artery disease involving native coronary artery of native heart without angina pectoris   2. PAD (peripheral artery disease) ([place])   3. Postoperative atrial fibrillation (HCC)   4. Dyslipidemia   5. Essential hypertension     PLAN:    In order of problems listed above:  CAD s/p CABG: Asymptomatic despite being very physically active.  On aspirin, statin, beta-blocker.  Heart rate was only 45 bpm last month, but today in clinic is 77.  Continue same medication. PAD: Currently asymptomatic even without cilostazol.  Encouraged him to keep up his walking exercises. AFib: No recurrence since the postoperative period.  Not on anticoagulants. HLP: Rather low HDL cholesterol but other parameters are all within target range.  Continue statin and exercise.  Encourage weight loss. HTN: Very well controlled today.  Medication Adjustments/Labs and Tests Ordered: Current medicines are reviewed at length with the patient today.  Concerns regarding medicines are outlined above.  No orders of the defined types were placed in this encounter.  No orders of the defined types were placed in this encounter.   Patient Instructions  Medication Instructions:  No changes *If you need a refill on your cardiac medications before your next appointment, please call  your pharmacy*   Lab Work: None ordered If you have labs (blood work) drawn today and your tests are completely normal, you will receive your results only by: [facility] (if you have MyChart) OR A paper copy in the mail If you have any lab test that is abnormal or we need to change your treatment, we will call you to review the results.   Testing/Procedures: None ordered   Follow-Up: At [facility], you and your health needs are our priority.  As part of our continuing mission to provide you with exceptional heart care, we have created designated Provider Care Teams.  These Care Teams include your primary Cardiologist (physician) and Advanced Practice Providers (APPs -  Physician Assistants and Nurse Practitioners) who all work together to provide you with the care you need, when you need it.  We recommend signing up for the patient portal called "MyChart".  Sign up information is provided on this After Visit Summary.  MyChart is used to connect with patients for Virtual Visits (Telemedicine).  Patients are able to view lab/test results, encounter notes, upcoming appointments, etc.  Non-urgent messages can be sent to your provider as well.   To learn more about what you can do with MyChart, go to [email or web address].    Your next appointment:   12 month(s)  The format for your next appointment:   In Person  Provider:   Sanda Klein, MD   Signed, Sanda Klein, MD  12/12/2021 11:27 AM    [place]

## 2021-12-12 NOTE — Patient Instructions (Signed)

## 2021-12-13 NOTE — Telephone Encounter (Signed)
Please review

## 2021-12-14 DIAGNOSIS — I10 Essential (primary) hypertension: Secondary | ICD-10-CM | POA: Diagnosis not present

## 2021-12-14 DIAGNOSIS — I2510 Atherosclerotic heart disease of native coronary artery without angina pectoris: Secondary | ICD-10-CM | POA: Diagnosis not present

## 2021-12-14 DIAGNOSIS — E7800 Pure hypercholesterolemia, unspecified: Secondary | ICD-10-CM | POA: Diagnosis not present

## 2022-01-11 ENCOUNTER — Other Ambulatory Visit: Payer: Self-pay | Admitting: Cardiovascular Disease

## 2022-01-22 DIAGNOSIS — I10 Essential (primary) hypertension: Secondary | ICD-10-CM | POA: Diagnosis not present

## 2022-01-22 DIAGNOSIS — E7800 Pure hypercholesterolemia, unspecified: Secondary | ICD-10-CM | POA: Diagnosis not present

## 2022-01-22 DIAGNOSIS — I2510 Atherosclerotic heart disease of native coronary artery without angina pectoris: Secondary | ICD-10-CM | POA: Diagnosis not present

## 2022-01-30 DIAGNOSIS — H524 Presbyopia: Secondary | ICD-10-CM | POA: Diagnosis not present

## 2022-01-30 DIAGNOSIS — H40053 Ocular hypertension, bilateral: Secondary | ICD-10-CM | POA: Diagnosis not present

## 2022-01-31 DIAGNOSIS — Z0000 Encounter for general adult medical examination without abnormal findings: Secondary | ICD-10-CM | POA: Diagnosis not present

## 2022-01-31 DIAGNOSIS — E7800 Pure hypercholesterolemia, unspecified: Secondary | ICD-10-CM | POA: Diagnosis not present

## 2022-01-31 DIAGNOSIS — I10 Essential (primary) hypertension: Secondary | ICD-10-CM | POA: Diagnosis not present

## 2022-01-31 DIAGNOSIS — I2510 Atherosclerotic heart disease of native coronary artery without angina pectoris: Secondary | ICD-10-CM | POA: Diagnosis not present

## 2022-01-31 DIAGNOSIS — M25552 Pain in left hip: Secondary | ICD-10-CM | POA: Diagnosis not present

## 2022-01-31 DIAGNOSIS — Z1211 Encounter for screening for malignant neoplasm of colon: Secondary | ICD-10-CM | POA: Diagnosis not present

## 2022-01-31 DIAGNOSIS — I739 Peripheral vascular disease, unspecified: Secondary | ICD-10-CM | POA: Diagnosis not present

## 2022-03-25 ENCOUNTER — Ambulatory Visit: Payer: Medicare Other | Admitting: Dermatology

## 2022-03-25 ENCOUNTER — Encounter: Payer: Self-pay | Admitting: Dermatology

## 2022-03-25 DIAGNOSIS — L570 Actinic keratosis: Secondary | ICD-10-CM

## 2022-03-25 DIAGNOSIS — D0461 Carcinoma in situ of skin of right upper limb, including shoulder: Secondary | ICD-10-CM | POA: Diagnosis not present

## 2022-03-25 DIAGNOSIS — Z85820 Personal history of malignant melanoma of skin: Secondary | ICD-10-CM | POA: Diagnosis not present

## 2022-03-25 DIAGNOSIS — L821 Other seborrheic keratosis: Secondary | ICD-10-CM

## 2022-03-25 DIAGNOSIS — D485 Neoplasm of uncertain behavior of skin: Secondary | ICD-10-CM

## 2022-03-25 DIAGNOSIS — Z85828 Personal history of other malignant neoplasm of skin: Secondary | ICD-10-CM

## 2022-03-25 DIAGNOSIS — Z1283 Encounter for screening for malignant neoplasm of skin: Secondary | ICD-10-CM | POA: Diagnosis not present

## 2022-03-25 NOTE — Patient Instructions (Signed)

## 2022-03-27 DIAGNOSIS — Z86010 Personal history of colonic polyps: Secondary | ICD-10-CM | POA: Diagnosis not present

## 2022-04-04 ENCOUNTER — Telehealth: Payer: Self-pay | Admitting: Dermatology

## 2022-04-04 ENCOUNTER — Telehealth: Payer: Self-pay | Admitting: *Deleted

## 2022-04-04 ENCOUNTER — Telehealth: Payer: Self-pay

## 2022-04-04 NOTE — Telephone Encounter (Signed)
Patient's wife left message on office voice mail that she was calling for pathology results from patient's last visit with Lavonna Monarch, M.D. ?

## 2022-04-04 NOTE — Telephone Encounter (Signed)
Primary Cardiologist:Muhammad Fletcher Anon, MD ? ?Chart reviewed as part of pre-operative protocol coverage. Because of Elgie Maziarz past medical history and time since last visit, he/she will require a virtual visit/telephone call in order to better assess preoperative cardiovascular risk. ? ?Pre-op covering staff: ?- Please contact patient, obtain consent, and schedule appointment  ? ?If applicable, this message will also be routed to pharmacy pool and/or primary cardiologist for input on holding anticoagulant/antiplatelet agent as requested below so that this information is available at time of patient's appointment.  ? ?Emmaline Life, NP-C ? ?  ?04/04/2022, 1:52 PM ?[place] ?[address] ?Office [phone] Fax [phone] ? ?

## 2022-04-04 NOTE — Telephone Encounter (Signed)
Pt has been scheduled for a preop telephone visit, 04/09/22. ?

## 2022-04-04 NOTE — Telephone Encounter (Signed)
Pt has been scheduled for a telephone visit, 04/09/22 9:00, medications reconciled / consent on file. ? ? ?  ?Patient Consent for Virtual Visit  ? ? ?   ? ?David [name] has provided verbal consent on 04/04/2022 for a virtual visit (video or telephone). ? ? ?CONSENT FOR VIRTUAL VISIT FOR:  David [name]  ?By participating in this virtual visit I agree to the following: ? ?I hereby voluntarily request, consent and authorize [facility] and its employed or contracted physicians, physician assistants, nurse practitioners or other licensed health care professionals (the Practitioner), to provide me with telemedicine health care services (the ?Services") as deemed necessary by the treating Practitioner. I acknowledge and consent to receive the Services by the Practitioner via telemedicine. I understand that the telemedicine visit will involve communicating with the Practitioner through live audiovisual communication technology and the disclosure of certain medical information by electronic transmission. I acknowledge that I have been given the opportunity to request an in-person assessment or other available alternative prior to the telemedicine visit and am voluntarily participating in the telemedicine visit. ? ?I understand that I have the right to withhold or withdraw my consent to the use of telemedicine in the course of my care at any time, without affecting my right to future care or treatment, and that the Practitioner or I may terminate the telemedicine visit at any time. I understand that I have the right to inspect all information obtained and/or recorded in the course of the telemedicine visit and may receive copies of available information for a reasonable fee.  I understand that some of the potential risks of receiving the Services via telemedicine include:  ?Delay or interruption in medical evaluation due to technological equipment failure or disruption; ?Information transmitted may not be sufficient (e.g.  poor resolution of images) to allow for appropriate medical decision making by the Practitioner; and/or  ?In rare instances, security protocols could fail, causing a breach of personal health information. ? ?Furthermore, I acknowledge that it is my responsibility to provide information about my medical history, conditions and care that is complete and accurate to the best of my ability. I acknowledge that Practitioner's advice, recommendations, and/or decision may be based on factors not within their control, such as incomplete or inaccurate data provided by me or distortions of diagnostic images or specimens that may result from electronic transmissions. I understand that the practice of medicine is not an exact science and that Practitioner makes no warranties or guarantees regarding treatment outcomes. I acknowledge that a copy of this consent can be made available to me via my patient portal ([facility]), or I can request a printed copy by calling the office of [facility].   ? ?I understand that my insurance will be billed for this visit.  ? ?I have read or had this consent read to me. ?I understand the contents of this consent, which adequately explains the benefits and risks of the Services being provided via telemedicine.  ?I have been provided ample opportunity to ask questions regarding this consent and the Services and have had my questions answered to my satisfaction. ?I give my informed consent for the services to be provided through the use of telemedicine in my medical care ? ? ? ?

## 2022-04-04 NOTE — Telephone Encounter (Signed)
Pathology to patient wife lagray. 6 month recheck scheduled.  ?

## 2022-04-04 NOTE — Telephone Encounter (Signed)
? ?  Pre-operative Risk Assessment  ?  ?Patient Name: David [name]  ?DOB: [birth year] ?MRN: [id number]  ? ?  ? ?Request for Surgical Clearance   ? ?Procedure:   COLONOSCOPY TBD ? ?Date of Surgery:  Clearance TBD                              ?   ?Surgeon:  DR Otis Brace ?Surgeon's Group or Practice Name:  [facility] ?Phone number:  [phone] ?Fax number:  [phone] ?  ?Type of Clearance Requested:   ?- Medical  ?  ?Type of Anesthesia:   NOT LISTED ?  ?Additional requests/questions:   ? ? ? ?

## 2022-04-04 NOTE — Telephone Encounter (Signed)
Left voice mail for pt to call back to schedule appointment for pre-op clearance.  ?

## 2022-04-05 ENCOUNTER — Ambulatory Visit: Payer: Medicare Other | Admitting: Nurse Practitioner

## 2022-04-09 ENCOUNTER — Encounter: Payer: Self-pay | Admitting: Physician Assistant

## 2022-04-09 ENCOUNTER — Ambulatory Visit (INDEPENDENT_AMBULATORY_CARE_PROVIDER_SITE_OTHER): Payer: Medicare Other | Admitting: Physician Assistant

## 2022-04-09 DIAGNOSIS — Z01810 Encounter for preprocedural cardiovascular examination: Secondary | ICD-10-CM

## 2022-04-09 NOTE — Progress Notes (Signed)
? ?Virtual Visit via Telephone Note  ? ?This visit type was conducted due to national recommendations for restrictions regarding the COVID-19 Pandemic (e.g. social distancing) in an effort to limit this patient's exposure and mitigate transmission in our community.  Due to his co-morbid illnesses, this patient is at least at moderate risk for complications without adequate follow up.  This format is felt to be most appropriate for this patient at this time.  The patient did not have access to video technology/had technical difficulties with video requiring transitioning to audio format only (telephone).  All issues noted in this document were discussed and addressed.  No physical exam could be performed with this format.  Please refer to the patient's chart for his  consent to telehealth for [facility]. ? ?Evaluation Performed:  Preoperative cardiovascular risk assessment ?_____________  ? ?Date:  04/09/2022  ? ?Patient ID:  David [name], DOB 08/14/1951, MRN [id number] ?Patient Location:  ?Home ?Provider location:   ?Office ? ?Primary Care Provider:  Orpah Melter, MD ?Primary Cardiologist:  Kathlyn Sacramento, MD ? ?Chief Complaint  ?  ?71 y.o. y/o male with a h/o CAD with NSTEMI 2019 s/p CABG, post-op AF, HTN, HLD, PAD, baseline sinus bradycardia, former tobacco abuse, who is pending colonoscopy, and presents today for telephonic preoperative cardiovascular risk assessment. ? ?Past Medical History  ?  ?Past Medical History:  ?Diagnosis Date  ? Basal cell carcinoma 01/30/2021  ? infundibulocystic- right ala nasi - (MOHS)  ? Dyslipidemia   ? Essential hypertension   ? Melanoma ([place]) 01/14/2012  ? Level II- right lower cheek (MOHS)  ? S/P CABG x 4   ? Squamous cell carcinoma of skin 09/08/2014  ? in situ- enter fronal scalp (Txpbx)  ? Squamous cell carcinoma of skin 10/16/2016  ? in situ- left lower lip (CX35FU)  ? ?Past Surgical History:  ?Procedure Laterality Date  ? CORONARY ARTERY BYPASS GRAFT N/A 10/06/2018  ?  Procedure: CORONARY ARTERY BYPASS GRAFTING (CABG) x four, using left internal mammary artery and right leg greater saphenous vein harvested endoscopically;  Surgeon: Ivin Poot, MD;  Location: [place];  Service: Open Heart Surgery;  Laterality: N/A;  ? LEFT HEART CATH AND CORONARY ANGIOGRAPHY N/A 10/02/2018  ? Procedure: LEFT HEART CATH AND CORONARY ANGIOGRAPHY;  Surgeon: Belva Crome, MD;  Location: [place] [facility];  Service: Cardiovascular;  Laterality: N/A;  ? TEE WITHOUT CARDIOVERSION N/A 10/06/2018  ? Procedure: TRANSESOPHAGEAL ECHOCARDIOGRAM (TEE);  Surgeon: Prescott Gum, Collier Salina, MD;  Location: [place];  Service: Open Heart Surgery;  Laterality: N/A;  ? ? ?Allergies ? ?No Known Allergies ? ?History of Present Illness  ?  ?David [name] is a 71 y.o. male who presents via audio/video conferencing for a telehealth visit today.  Pt was last seen in cardiology clinic on 12/12/21 by Dr. Sallyanne Kuster.  At that time David [name] was doing well.  He has a history of bradycardia in prior visits but HR at that OV was 77. The patient is now pending colonoscopy, deemed by colleague as requiring virtual visit for clearance.  Since his last visit, he has been doing well without any recent cardiac symptoms. He walk every other day ~4 miles and also does yardwork including mowing grass, weed-eating, leaf blowing. No recent cardiac symptomatology. ? ? ?Home Medications  ?  ?Prior to Admission medications   ?Medication Sig Start Date End Date Taking? Authorizing Provider  ?amLODipine (NORVASC) 10 MG tablet TAKE 1 TABLET BY MOUTH  DAILY 12/13/21  Wellington Hampshire, MD  ?aspirin EC 81 MG tablet Take 81 mg by mouth daily.    [provider]  ?atorvastatin (LIPITOR) 80 MG tablet Take 40 mg by mouth daily. Pt takes 40 mg  a day    [provider]  ?finasteride (PROSCAR) 5 MG tablet Take 5 mg by mouth daily.    [provider]  ?hydrochlorothiazide (MICROZIDE) 12.5 MG capsule TAKE 1 CAPSULE BY MOUTH EVERY  DAY 01/11/22   Wellington Hampshire, MD  ?isosorbide mononitrate (IMDUR) 30 MG 24 hr tablet TAKE 1 TABLET BY MOUTH EVERY DAY 01/11/22   Wellington Hampshire, MD  ?metoprolol tartrate (LOPRESSOR) 25 MG tablet TAKE 1 TABLET BY MOUTH  TWICE DAILY 11/19/21   Croitoru, Dani Gobble, MD  ?nitroGLYCERIN (NITROSTAT) 0.4 MG SL tablet Place 1 tablet (0.4 mg total) under the tongue every 5 (five) minutes as needed for chest pain. 05/04/19 04/04/22  Croitoru, Mihai, MD  ?tamsulosin (FLOMAX) 0.4 MG CAPS capsule TAKE 1 CAPSULE BY MOUTH  DAILY 07/06/19   Croitoru, Dani Gobble, MD  ? ? ?Physical Exam  ?  ?Vital Signs:  David [name] does not have vital signs available for review today, but denies any symptom concerns. ? ?Given telephonic nature of communication, physical exam is limited. ?AAOx3. NAD. Normal affect.  Speech and respirations are unlabored. ? ?Accessory Clinical Findings  ?  ?None ? ?Assessment & Plan  ?  ?1.  Preoperative Cardiovascular Risk Assessment: RCRI 0.9% indicating low CV risk. The patient affirms he has been doing well without any new cardiac symptoms. Therefore, based on ACC/AHA guidelines, the patient would be at acceptable risk for the planned procedure without further cardiovascular testing. The patient was advised that if he develops new symptoms prior to surgery to contact our office to arrange for a follow-up visit, and he verbalized understanding. ? ?He is on low dose '81mg'$  of ASA daily and states he was advised by GI team that he may continue without interruption. ? ?A copy of this note will be routed to requesting surgeon. ? ?Time:   ?Today, I have spent 5 minutes with the patient with telehealth technology reviewing and discussing medical history, symptoms, and management plan. Also reviewed prior cardiac studies and notes. ? ? ?[name] Pitter, PA-C ? ?04/09/2022, 9:01 AM ? ?

## 2022-04-14 ENCOUNTER — Encounter: Payer: Self-pay | Admitting: Dermatology

## 2022-04-14 NOTE — Progress Notes (Signed)
? ?Follow-Up Visit ?  ?Subjective  ?David [name] is a 71 y.o. male who presents for the following: Annual Exam (Annual skin exam. Lesion on left cheek x unknown- itches. Lesion on back x unknown. Personal history of bcc, scc, and melanoma. ). ? ?Annual skin examination, history of, several new areas ?Location:  ?Duration:  ?Quality:  ?Associated Signs/Symptoms: ?Modifying Factors:  ?Severity:  ?Timing: ?Context:  ? ?Objective  ?Well appearing patient in no apparent distress; mood and affect are within normal limits. ?Full body exam: No atypical pigmented lesions (all checked with dermoscopy).  1 possible new nonmelanoma skin cancer right arm will be biopsied and treated. ? ?Left Buccal Cheek, Mid Back ?Flattopped light brown textured 6 mm papules ? ?Left Hand - Posterior, Right Hand - Posterior ?Gritty pink 5 mm crusts ? ?Neck - Anterior ?No sign repigmentation, no regional adenopathy ? ?Right Upper arm ?Waxy 9 mm pink crust compatible with superficial carcinoma ? ? ? ? ? ? ? ? ?A full examination was performed including scalp, head, eyes, ears, nose, lips, neck, chest, axillae, abdomen, back, buttocks, bilateral upper extremities, bilateral lower extremities, hands, feet, fingers, toes, fingernails, and toenails. All findings within normal limits unless otherwise noted below. ? ? ?Assessment & Plan  ? ? ?Encounter for screening for malignant neoplasm of skin ? ?Annual skin examination. ? ?Seborrheic keratosis (2) ?Mid Back; Left Buccal Cheek ? ?Leave if stable ? ?AK (actinic keratosis) (2) ?Left Hand - Posterior; Right Hand - Posterior ? ?Destruction of lesion - Left Hand - Posterior, Right Hand - Posterior ?Complexity: simple   ?Destruction method: cryotherapy   ?Informed consent: discussed and consent obtained   ?Lesion destroyed using liquid nitrogen: Yes   ?Cryotherapy cycles:  5 ?Outcome: patient tolerated procedure well with no complications   ? ?Personal history of malignant melanoma of skin ?Neck -  Anterior ? ?Check as needed change plus annual skin examination ? ?Squamous cell carcinoma in situ (SCCIS) of skin of right upper arm ?Right Upper arm ? ?Skin / nail biopsy ?Type of biopsy: tangential   ?Informed consent: discussed and consent obtained   ?Timeout: patient name, date of birth, surgical site, and procedure verified   ?Anesthesia: the lesion was anesthetized in a standard fashion   ?Anesthetic:  1% lidocaine w/ epinephrine 1-100,000 local infiltration ?Instrument used: flexible razor blade   ?Hemostasis achieved with: aluminum chloride and electrodesiccation   ?Outcome: patient tolerated procedure well   ?Post-procedure details: wound care instructions given   ? ?Destruction of lesion ?Complexity: simple   ?Destruction method: electrodesiccation and curettage   ?Informed consent: discussed and consent obtained   ?Timeout:  patient name, date of birth, surgical site, and procedure verified ?Anesthesia: the lesion was anesthetized in a standard fashion   ?Anesthetic:  1% lidocaine w/ epinephrine 1-100,000 local infiltration ?Curettage performed in three different directions: Yes   ?Electrodesiccation performed over the curetted area: Yes   ?Curettage cycles:  3 ?Lesion length (cm):  1 ?Lesion width (cm):  1 ?Margin per side (cm):  0 ?Final wound size (cm):  1 ?Hemostasis achieved with:  ferric subsulfate ?Outcome: patient tolerated procedure well with no complications   ?Post-procedure details: wound care instructions given   ? ?Specimen 1 - Surgical pathology ?Differential Diagnosis: bcc vs scc- txpbx ? ?Check Margins: No ? ?After shave biopsy the base of the lesion was treated with curettage plus cautery ? ? ? ? ? ?I, Lavonna Monarch, MD, have reviewed all documentation for this visit.  The documentation  on 04/14/22 for the exam, diagnosis, procedures, and orders are all accurate and complete. ?

## 2022-04-29 DIAGNOSIS — D122 Benign neoplasm of ascending colon: Secondary | ICD-10-CM | POA: Diagnosis not present

## 2022-04-29 DIAGNOSIS — K648 Other hemorrhoids: Secondary | ICD-10-CM | POA: Diagnosis not present

## 2022-04-29 DIAGNOSIS — K5730 Diverticulosis of large intestine without perforation or abscess without bleeding: Secondary | ICD-10-CM | POA: Diagnosis not present

## 2022-04-29 DIAGNOSIS — Z1211 Encounter for screening for malignant neoplasm of colon: Secondary | ICD-10-CM | POA: Diagnosis not present

## 2022-05-01 DIAGNOSIS — D122 Benign neoplasm of ascending colon: Secondary | ICD-10-CM | POA: Diagnosis not present

## 2022-05-14 ENCOUNTER — Encounter: Payer: Self-pay | Admitting: Cardiovascular Disease

## 2022-07-02 ENCOUNTER — Ambulatory Visit: Payer: Medicare Other | Admitting: Dermatology

## 2022-08-01 DIAGNOSIS — H40053 Ocular hypertension, bilateral: Secondary | ICD-10-CM | POA: Diagnosis not present

## 2022-08-14 DIAGNOSIS — H25811 Combined forms of age-related cataract, right eye: Secondary | ICD-10-CM | POA: Diagnosis not present

## 2022-08-14 DIAGNOSIS — H25812 Combined forms of age-related cataract, left eye: Secondary | ICD-10-CM | POA: Diagnosis not present

## 2022-08-14 DIAGNOSIS — H40023 Open angle with borderline findings, high risk, bilateral: Secondary | ICD-10-CM | POA: Diagnosis not present

## 2022-08-15 ENCOUNTER — Other Ambulatory Visit: Payer: Self-pay | Admitting: Cardiovascular Disease

## 2022-08-29 DIAGNOSIS — L720 Epidermal cyst: Secondary | ICD-10-CM | POA: Diagnosis not present

## 2022-08-29 DIAGNOSIS — D0461 Carcinoma in situ of skin of right upper limb, including shoulder: Secondary | ICD-10-CM | POA: Diagnosis not present

## 2022-08-29 DIAGNOSIS — L905 Scar conditions and fibrosis of skin: Secondary | ICD-10-CM | POA: Diagnosis not present

## 2022-08-29 DIAGNOSIS — L821 Other seborrheic keratosis: Secondary | ICD-10-CM | POA: Diagnosis not present

## 2022-08-29 DIAGNOSIS — L570 Actinic keratosis: Secondary | ICD-10-CM | POA: Diagnosis not present

## 2022-08-29 DIAGNOSIS — D485 Neoplasm of uncertain behavior of skin: Secondary | ICD-10-CM | POA: Diagnosis not present

## 2022-09-03 DIAGNOSIS — D0461 Carcinoma in situ of skin of right upper limb, including shoulder: Secondary | ICD-10-CM | POA: Diagnosis not present

## 2022-09-03 DIAGNOSIS — L720 Epidermal cyst: Secondary | ICD-10-CM | POA: Diagnosis not present

## 2022-09-12 ENCOUNTER — Other Ambulatory Visit: Payer: Self-pay | Admitting: Cardiovascular Disease

## 2022-09-12 DIAGNOSIS — L720 Epidermal cyst: Secondary | ICD-10-CM | POA: Diagnosis not present

## 2022-09-12 NOTE — Telephone Encounter (Signed)
Refill request

## 2022-10-15 ENCOUNTER — Encounter: Payer: Self-pay | Admitting: Cardiovascular Disease

## 2022-10-15 DIAGNOSIS — Z23 Encounter for immunization: Secondary | ICD-10-CM | POA: Diagnosis not present

## 2022-10-24 DIAGNOSIS — H401110 Primary open-angle glaucoma, right eye, stage unspecified: Secondary | ICD-10-CM | POA: Diagnosis not present

## 2022-10-24 DIAGNOSIS — H40022 Open angle with borderline findings, high risk, left eye: Secondary | ICD-10-CM | POA: Diagnosis not present

## 2022-10-24 DIAGNOSIS — I10 Essential (primary) hypertension: Secondary | ICD-10-CM | POA: Diagnosis not present

## 2022-10-24 DIAGNOSIS — Z87891 Personal history of nicotine dependence: Secondary | ICD-10-CM | POA: Diagnosis not present

## 2022-10-24 DIAGNOSIS — E785 Hyperlipidemia, unspecified: Secondary | ICD-10-CM | POA: Diagnosis not present

## 2022-10-24 DIAGNOSIS — Z79899 Other long term (current) drug therapy: Secondary | ICD-10-CM | POA: Diagnosis not present

## 2022-10-24 DIAGNOSIS — Z951 Presence of aortocoronary bypass graft: Secondary | ICD-10-CM | POA: Diagnosis not present

## 2022-10-24 DIAGNOSIS — Z7982 Long term (current) use of aspirin: Secondary | ICD-10-CM | POA: Diagnosis not present

## 2022-10-24 DIAGNOSIS — H2181 Floppy iris syndrome: Secondary | ICD-10-CM | POA: Diagnosis not present

## 2022-10-24 DIAGNOSIS — H25812 Combined forms of age-related cataract, left eye: Secondary | ICD-10-CM | POA: Diagnosis not present

## 2022-10-24 DIAGNOSIS — H2512 Age-related nuclear cataract, left eye: Secondary | ICD-10-CM | POA: Diagnosis not present

## 2022-11-28 DIAGNOSIS — H43312 Vitreous membranes and strands, left eye: Secondary | ICD-10-CM | POA: Diagnosis not present

## 2022-12-06 DIAGNOSIS — R300 Dysuria: Secondary | ICD-10-CM | POA: Diagnosis not present

## 2022-12-18 DIAGNOSIS — H43813 Vitreous degeneration, bilateral: Secondary | ICD-10-CM | POA: Diagnosis not present

## 2022-12-18 DIAGNOSIS — H524 Presbyopia: Secondary | ICD-10-CM | POA: Diagnosis not present

## 2022-12-24 DIAGNOSIS — R300 Dysuria: Secondary | ICD-10-CM | POA: Diagnosis not present

## 2022-12-24 DIAGNOSIS — R350 Frequency of micturition: Secondary | ICD-10-CM | POA: Diagnosis not present

## 2023-01-20 ENCOUNTER — Other Ambulatory Visit: Payer: Self-pay | Admitting: Cardiovascular Disease

## 2023-01-20 NOTE — Telephone Encounter (Signed)
Refill request

## 2023-01-27 ENCOUNTER — Ambulatory Visit: Payer: Medicare Other | Attending: Cardiovascular Disease | Admitting: Cardiovascular Disease

## 2023-01-27 ENCOUNTER — Encounter: Payer: Self-pay | Admitting: Cardiovascular Disease

## 2023-01-27 VITALS — BP 122/66 | HR 52 | Ht 70.0 in | Wt 234.8 lb

## 2023-01-27 DIAGNOSIS — E785 Hyperlipidemia, unspecified: Secondary | ICD-10-CM

## 2023-01-27 DIAGNOSIS — Z951 Presence of aortocoronary bypass graft: Secondary | ICD-10-CM | POA: Diagnosis not present

## 2023-01-27 DIAGNOSIS — I2510 Atherosclerotic heart disease of native coronary artery without angina pectoris: Secondary | ICD-10-CM | POA: Diagnosis not present

## 2023-01-27 DIAGNOSIS — I739 Peripheral vascular disease, unspecified: Secondary | ICD-10-CM

## 2023-01-27 DIAGNOSIS — I10 Essential (primary) hypertension: Secondary | ICD-10-CM

## 2023-01-27 MED ORDER — NITROGLYCERIN 0.4 MG SL SUBL: 0.4000 mg | SUBLINGUAL_TABLET | SUBLINGUAL | 3 refills | Status: AC | PRN

## 2023-01-27 MED ORDER — METOPROLOL TARTRATE 25 MG PO TABS: ORAL_TABLET | ORAL | 3 refills | Status: DC

## 2023-01-27 NOTE — Patient Instructions (Signed)
Medication Instructions:  12.'5mg'$  (1/2 tablet) Metoprolol Tartrate every morning and '25mg'$  (1 tablet) every night *If you need a refill on your cardiac medications before your next appointment, please call your pharmacy*  Follow-Up: At [facility], you and your health needs are our priority.  As part of our continuing mission to provide you with exceptional heart care, we have created designated Provider Care Teams.  These Care Teams include your primary Cardiologist (physician) and Advanced Practice Providers (APPs -  Physician Assistants and Nurse Practitioners) who all work together to provide you with the care you need, when you need it.  We recommend signing up for the patient portal called "MyChart".  Sign up information is provided on this After Visit Summary.  MyChart is used to connect with patients for Virtual Visits (Telemedicine).  Patients are able to view lab/test results, encounter notes, upcoming appointments, etc.  Non-urgent messages can be sent to your provider as well.   To learn more about what you can do with MyChart, go to [email or web address].    Your next appointment:   1 year(s)  Provider:   Dr Sallyanne Kuster

## 2023-01-27 NOTE — Progress Notes (Signed)
Cardiology Office Note:    Date:  01/27/2023   ID:  David [name], DOB March 02, 1951, MRN [id number]  PCP:  Orpah Melter, MD  Cardiologist:  Kathlyn Sacramento, MD  Electrophysiologist:  None   Referring MD: Orpah Melter, MD   Chief Complaint  Patient presents with   Coronary Artery Disease    History of Present Illness:    David [name] is a 72 y.o. male who presented with a small non-STEMI in October 2019, was found to have multivessel coronary artery disease with total occlusion of the right coronary artery and high-grade stenosis of the LAD and large diagonal branch, subsequently undergoing four-vessel bypass surgery (LIMA to LAD, SVG to diagonal, sequential SVG to PLA and PDA, Dr. Prescott Gum, October 06, 2018).  Surgery was complicated by transient postoperative atrial fibrillation.  He took amiodarone briefly, but was never prescribed anticoagulants.  He has preserved left ventricular systolic function, with inferior wall hypokinesis.  He has hypertension, hyperlipidemia and quit smoking many years ago.  Is been well since his last appointment without any complaints of chest pain.  His previous anginal was retrosternal discomfort.  He occasionally has intrascapular pain when he uses his leaf blower, but not during other types of physical activity.  He goes to exercise by walking at the gym 3-4 days a week, 45 minutes to 75 minutes each time.  He does not experience chest pain or shortness of breath.  He is concerned that he gained some weight.  He denies exertional dyspnea, orthopnea, PND, claudication, focal neurological complaints, palpitations, dizziness or syncope.  In the morning, after he eats breakfast and takes his medication he has a brief period of sleepiness although he does not have to really go back to bed.  He is always bradycardic.  He has mild left ankle swelling towards the end of the day that resolves by the next morning (paradoxically his saphenectomy was performed from the  right side).  It is never severe.  Since his last appointment he had a colonoscopy and cataract surgery on the left side.  He still complains of some floaters in his field of vision although this is improving.  Past Medical History:  Diagnosis Date   Basal cell carcinoma 01/30/2021   infundibulocystic- right ala nasi - (MOHS)   Dyslipidemia    Essential hypertension    Melanoma ([place]) 01/14/2012   Level II- right lower cheek (MOHS)   S/P CABG x 4    Squamous cell carcinoma of skin 09/08/2014   in situ- enter fronal scalp (Txpbx)   Squamous cell carcinoma of skin 10/16/2016   in situ- left lower lip (CX35FU)    Past Surgical History:  Procedure Laterality Date   CORONARY ARTERY BYPASS GRAFT N/A 10/06/2018   Procedure: CORONARY ARTERY BYPASS GRAFTING (CABG) x four, using left internal mammary artery and right leg greater saphenous vein harvested endoscopically;  Surgeon: Ivin Poot, MD;  Location: [place];  Service: Open Heart Surgery;  Laterality: N/A;   LEFT HEART CATH AND CORONARY ANGIOGRAPHY N/A 10/02/2018   Procedure: LEFT HEART CATH AND CORONARY ANGIOGRAPHY;  Surgeon: Belva Crome, MD;  Location: [place] [facility];  Service: Cardiovascular;  Laterality: N/A;   TEE WITHOUT CARDIOVERSION N/A 10/06/2018   Procedure: TRANSESOPHAGEAL ECHOCARDIOGRAM (TEE);  Surgeon: Prescott Gum, Collier Salina, MD;  Location: [place];  Service: Open Heart Surgery;  Laterality: N/A;    Current Medications: Current Meds  Medication Sig   amLODipine (NORVASC) 10 MG tablet TAKE 1 TABLET BY  MOUTH DAILY   aspirin EC 81 MG tablet Take 81 mg by mouth daily.   atorvastatin (LIPITOR) 40 MG tablet Take 40 mg by mouth daily.   brimonidine (ALPHAGAN) 0.2 % ophthalmic solution 1 drop 2 (two) times daily.   finasteride (PROSCAR) 5 MG tablet Take 5 mg by mouth daily.   hydrochlorothiazide (MICROZIDE) 12.5 MG capsule TAKE 1 CAPSULE BY MOUTH EVERY DAY   isosorbide mononitrate (IMDUR) 30 MG 24 hr tablet TAKE 1 TABLET BY  MOUTH EVERY DAY   tamsulosin (FLOMAX) 0.4 MG CAPS capsule Take 0.4 mg by mouth 2 (two) times daily.   [DISCONTINUED] metoprolol tartrate (LOPRESSOR) 25 MG tablet TAKE 1 TABLET BY MOUTH TWICE  DAILY     Allergies:   Patient has no known allergies.   Social History   Socioeconomic History   Marital status: Married    Spouse name: Not on file   Number of children: Not on file   Years of education: Not on file   Highest education level: Not on file  Occupational History   Not on file  Tobacco Use   Smoking status: Never   Smokeless tobacco: Never  Vaping Use   Vaping Use: Never used  Substance and Sexual Activity   Alcohol use: Not on file   Drug use: Not on file   Sexual activity: Not on file  Other Topics Concern   Not on file  Social History Narrative   Not on file   Social Determinants of Health   Financial Resource Strain: Not on file  Food Insecurity: Not on file  Transportation Needs: Not on file  Physical Activity: Not on file  Stress: Not on file  Social Connections: Not on file     Family History: The patient's family history includes Heart attack (age of onset: 66) in his brother; Heart attack (age of onset: 30) in his mother.  ROS:   Please see the history of present illness.    All other systems are reviewed and are negative.   EKGs/Labs/Other Studies Reviewed:    The following studies were reviewed today:   EKG:  EKG is ordered today.  Shows sinus bradycardia with frequent PACs, possible old inferior infarction, diffuse nonspecific T wave flattening.   Recent Labs: No results found for requested labs within last 365 days.  Recent Lipid Panel    Component Value Date/Time   CHOL 109 12/21/2018 0810   TRIG 61 12/21/2018 0810   HDL 38 (L) 12/21/2018 0810   CHOLHDL 2.9 12/21/2018 0810   CHOLHDL 4.1 10/02/2018 0830   VLDL 15 10/02/2018 0830   LDLCALC 59 12/21/2018 0810  01/26/2021 total cholesterol 115, HDL 35 LDL 64, triglycerides  77 01/31/2022 Total cholesterol 122, HDL 35, LDL 69, triglycerides 90  Physical Exam:    VS:  BP 122/66 (BP Location: Left Arm, Patient Position: Sitting, Cuff Size: Large)   Pulse (!) 52   Ht '5\' 10"'$  (1.778 m)   Wt 234 lb 12.8 oz (106.5 kg)   SpO2 95%   BMI 33.69 kg/m     Wt Readings from Last 3 Encounters:  01/27/23 234 lb 12.8 oz (106.5 kg)  12/12/21 226 lb 6.4 oz (102.7 kg)  11/06/21 230 lb 9.6 oz (104.6 kg)      General: Alert, oriented x3, no distress, mildly obese Head: no evidence of trauma, PERRL, EOMI, no exophtalmos or lid lag, no myxedema, no xanthelasma; normal ears, nose and oropharynx Neck: normal jugular venous pulsations and no hepatojugular reflux;  brisk carotid pulses without delay and no carotid bruits Chest: clear to auscultation, no signs of consolidation by percussion or palpation, normal fremitus, symmetrical and full respiratory excursions Cardiovascular: normal position and quality of the apical impulse, regular rhythm, normal first and second heart sounds, no murmurs, rubs or gallops Abdomen: no tenderness or distention, no masses by palpation, no abnormal pulsatility or arterial bruits, normal bowel sounds, no hepatosplenomegaly Extremities: no clubbing, cyanosis or edema; 2+ radial, ulnar and brachial pulses bilaterally; 2+ right femoral, posterior tibial and dorsalis pedis pulses; 2+ left femoral, posterior tibial and dorsalis pedis pulses; no subclavian or femoral bruits Neurological: grossly nonfocal Psych: Normal mood and affect    ASSESSMENT:    1. Coronary artery disease involving native coronary artery of native heart without angina pectoris   2. S/P CABG x 4   3. PAD (peripheral artery disease) ([place])   4. Dyslipidemia (high LDL; low HDL)   5. Essential hypertension      PLAN:    In order of problems listed above:  CAD s/p CABG: Does not have angina with usual activity.  Suspect that the interscapular discomfort is musculoskeletal  rather than angina since it does not occur with other activities of similar or greater intensity.  Has some morning fatigue, which may be due to bradycardia.  Will decrease the morning dose of metoprolol to 12.5 mg.  He is on 3 antianginals including the metoprolol (also on amlodipine and isosorbide). PAD: Asymptomatic.  Continue walking exercises.  Suspect these will be good collaterals. AFib: No recurrence since the postoperative period.  Not on anticoagulants.  Good reason to keep him on long-term beta-blockers even though he has bradycardia (which is minimally symptomatic). HLP: Will have labs next month with PCP.  HDL cholesterol is chronically low.  Encourage weight loss. HTN: Good control  Medication Adjustments/Labs and Tests Ordered: Current medicines are reviewed at length with the patient today.  Concerns regarding medicines are outlined above.  Orders Placed This Encounter  Procedures   EKG 12-Lead   Meds ordered this encounter  Medications   nitroGLYCERIN (NITROSTAT) 0.4 MG SL tablet    Sig: Place 1 tablet (0.4 mg total) under the tongue every 5 (five) minutes as needed for chest pain.    Dispense:  25 tablet    Refill:  3   metoprolol tartrate (LOPRESSOR) 25 MG tablet    Sig: Take 0.5 tablets (12.5 mg total) by mouth every morning AND 1 tablet (25 mg total) at bedtime.    Dispense:  135 tablet    Refill:  3    Please send a replace/new response with 100-Day Supply if appropriate to maximize member benefit. Requesting 1 year supply.    Patient Instructions  Medication Instructions:  12.'5mg'$  (1/2 tablet) Metoprolol Tartrate every morning and '25mg'$  (1 tablet) every night *If you need a refill on your cardiac medications before your next appointment, please call your pharmacy*  Follow-Up: At [facility], you and your health needs are our priority.  As part of our continuing mission to provide you with exceptional heart care, we have created designated Provider Care  Teams.  These Care Teams include your primary Cardiologist (physician) and Advanced Practice Providers (APPs -  Physician Assistants and Nurse Practitioners) who all work together to provide you with the care you need, when you need it.  We recommend signing up for the patient portal called "MyChart".  Sign up information is provided on this After Visit Summary.  MyChart is used  to connect with patients for Virtual Visits (Telemedicine).  Patients are able to view lab/test results, encounter notes, upcoming appointments, etc.  Non-urgent messages can be sent to your provider as well.   To learn more about what you can do with MyChart, go to [email or web address].    Your next appointment:   1 year(s)  Provider:   Dr Sallyanne Kuster     Signed, Sanda Klein, MD  01/27/2023 8:23 AM    [place]

## 2023-02-09 ENCOUNTER — Other Ambulatory Visit: Payer: Self-pay | Admitting: Cardiovascular Disease

## 2023-02-10 NOTE — Telephone Encounter (Signed)
Refill Request.  

## 2023-03-31 ENCOUNTER — Ambulatory Visit: Payer: Medicare Other | Admitting: Dermatology

## 2023-04-01 DIAGNOSIS — Z85828 Personal history of other malignant neoplasm of skin: Secondary | ICD-10-CM | POA: Diagnosis not present

## 2023-04-01 DIAGNOSIS — D485 Neoplasm of uncertain behavior of skin: Secondary | ICD-10-CM | POA: Diagnosis not present

## 2023-04-01 DIAGNOSIS — L570 Actinic keratosis: Secondary | ICD-10-CM | POA: Diagnosis not present

## 2023-04-01 DIAGNOSIS — Z85820 Personal history of malignant melanoma of skin: Secondary | ICD-10-CM | POA: Diagnosis not present

## 2023-04-01 DIAGNOSIS — L820 Inflamed seborrheic keratosis: Secondary | ICD-10-CM | POA: Diagnosis not present

## 2023-04-01 DIAGNOSIS — L821 Other seborrheic keratosis: Secondary | ICD-10-CM | POA: Diagnosis not present

## 2023-04-01 DIAGNOSIS — D0461 Carcinoma in situ of skin of right upper limb, including shoulder: Secondary | ICD-10-CM | POA: Diagnosis not present

## 2023-04-01 DIAGNOSIS — D235 Other benign neoplasm of skin of trunk: Secondary | ICD-10-CM | POA: Diagnosis not present

## 2023-04-02 ENCOUNTER — Other Ambulatory Visit: Payer: Self-pay | Admitting: Cardiovascular Disease

## 2023-04-02 NOTE — Telephone Encounter (Signed)
Refill request

## 2023-04-08 DIAGNOSIS — I25709 Atherosclerosis of coronary artery bypass graft(s), unspecified, with unspecified angina pectoris: Secondary | ICD-10-CM | POA: Diagnosis not present

## 2023-04-08 DIAGNOSIS — I739 Peripheral vascular disease, unspecified: Secondary | ICD-10-CM | POA: Diagnosis not present

## 2023-04-08 DIAGNOSIS — Z0000 Encounter for general adult medical examination without abnormal findings: Secondary | ICD-10-CM | POA: Diagnosis not present

## 2023-04-08 DIAGNOSIS — I10 Essential (primary) hypertension: Secondary | ICD-10-CM | POA: Diagnosis not present

## 2023-04-08 DIAGNOSIS — T753XXA Motion sickness, initial encounter: Secondary | ICD-10-CM | POA: Diagnosis not present

## 2023-04-08 DIAGNOSIS — E7800 Pure hypercholesterolemia, unspecified: Secondary | ICD-10-CM | POA: Diagnosis not present

## 2023-04-08 LAB — LAB REPORT - SCANNED
Creatinine, POC: 122 mg/dL
EGFR: 79
Microalb Creat Ratio: 31.7
Microalbumin, Urine: 3.87

## 2023-07-02 ENCOUNTER — Other Ambulatory Visit: Payer: Self-pay | Admitting: Cardiovascular Disease

## 2023-07-09 ENCOUNTER — Encounter: Payer: Self-pay | Admitting: Cardiovascular Disease

## 2023-07-09 ENCOUNTER — Other Ambulatory Visit: Payer: Self-pay | Admitting: Cardiovascular Disease

## 2023-07-09 DIAGNOSIS — H40053 Ocular hypertension, bilateral: Secondary | ICD-10-CM | POA: Diagnosis not present

## 2023-07-09 MED ORDER — ACETAZOLAMIDE 250 MG PO TABS: 250.0000 mg | ORAL_TABLET | Freq: Two times a day (BID) | ORAL | 0 refills | Status: AC

## 2023-07-09 NOTE — Telephone Encounter (Signed)
Refill request

## 2023-07-09 NOTE — Telephone Encounter (Signed)
He can take acetazolamide 250 mg twice daily for 3 days, starting the day BEFORE he arrives at [place]. Please send in a prescription. For acetazolamide 250 mg, 6 tabs, no RF

## 2023-09-30 DIAGNOSIS — Z86008 Personal history of in-situ neoplasm of other site: Secondary | ICD-10-CM | POA: Diagnosis not present

## 2023-09-30 DIAGNOSIS — L820 Inflamed seborrheic keratosis: Secondary | ICD-10-CM | POA: Diagnosis not present

## 2023-09-30 DIAGNOSIS — L821 Other seborrheic keratosis: Secondary | ICD-10-CM | POA: Diagnosis not present

## 2023-09-30 DIAGNOSIS — L570 Actinic keratosis: Secondary | ICD-10-CM | POA: Diagnosis not present

## 2023-10-01 DIAGNOSIS — Z23 Encounter for immunization: Secondary | ICD-10-CM | POA: Diagnosis not present

## 2023-10-21 ENCOUNTER — Other Ambulatory Visit: Payer: Self-pay | Admitting: Cardiovascular Disease

## 2024-01-28 ENCOUNTER — Ambulatory Visit: Payer: Medicare Other | Attending: Cardiovascular Disease | Admitting: Cardiovascular Disease

## 2024-01-28 ENCOUNTER — Encounter: Payer: Self-pay | Admitting: Cardiovascular Disease

## 2024-01-28 VITALS — BP 122/70 | HR 46 | Ht 70.0 in | Wt 234.0 lb

## 2024-01-28 DIAGNOSIS — I4891 Unspecified atrial fibrillation: Secondary | ICD-10-CM

## 2024-01-28 DIAGNOSIS — I9789 Other postprocedural complications and disorders of the circulatory system, not elsewhere classified: Secondary | ICD-10-CM | POA: Diagnosis not present

## 2024-01-28 DIAGNOSIS — I739 Peripheral vascular disease, unspecified: Secondary | ICD-10-CM

## 2024-01-28 DIAGNOSIS — I10 Essential (primary) hypertension: Secondary | ICD-10-CM

## 2024-01-28 DIAGNOSIS — I25118 Atherosclerotic heart disease of native coronary artery with other forms of angina pectoris: Secondary | ICD-10-CM

## 2024-01-28 DIAGNOSIS — E785 Hyperlipidemia, unspecified: Secondary | ICD-10-CM

## 2024-01-28 NOTE — Patient Instructions (Signed)
 Medication Instructions:  Wean off of metoprolol  as follows: take 1/2 tablet by mouth twice per day for 3 days then stop completely. *If you need a refill on your cardiac medications before your next appointment, please call your pharmacy*   Follow-Up: At [facility], you and your health needs are our priority.  As part of our continuing mission to provide you with exceptional heart care, we have created designated Provider Care Teams.  These Care Teams include your primary Cardiologist (physician) and Advanced Practice Providers (APPs -  Physician Assistants and Nurse Practitioners) who all work together to provide you with the care you need, when you need it.  We recommend signing up for the patient portal called MyChart.  Sign up information is provided on this After Visit Summary.  MyChart is used to connect with patients for Virtual Visits (Telemedicine).  Patients are able to view lab/test results, encounter notes, upcoming appointments, etc.  Non-urgent messages can be sent to your provider as well.   To learn more about what you can do with MyChart, go to [email or web address].    Your next appointment:   1 year(s)  Provider:   Jerel Balding, MD     Other Instructions Take and record blood pressure daily for one week. Record on BP log provided in office.

## 2024-01-28 NOTE — Progress Notes (Signed)
 Cardiology Office Note:    Date:  01/28/2024   ID:  David [name], DOB 01-May-1951, MRN [id number]  PCP:  No primary care provider on file.  Cardiologist:  Jerel Balding, MD  Electrophysiologist:  None   Referring MD: Nanci Senior, MD   No chief complaint on file.   History of Present Illness:    David [name] is a 73 y.o. male who presented with a small non-STEMI in October 2019, was found to have multivessel coronary artery disease with total occlusion of the right coronary artery and high-grade stenosis of the LAD and large diagonal branch, subsequently undergoing four-vessel bypass surgery (LIMA to LAD, SVG to diagonal, sequential SVG to PLA and PDA, Dr. fleeta Ochoa, October 06, 2018).  Surgery was complicated by transient postoperative atrial fibrillation.  He took amiodarone  briefly, but was never prescribed anticoagulants.  He has preserved left ventricular systolic function, with inferior wall hypokinesis.  He has hypertension, hyperlipidemia and quit smoking many years ago.  He is doing quite well from a cardiac point of view.  As he also described last year he has interscapular pain when he stands for long periods of time such as washing dishes at the sink.  It resolves if he stretches.  It does not occur during other types of physical activity.  He is exercising walking about 5000 steps, 3 to 4 days a week.  He does not get short of breath or dizzy when he does this.  He denies orthopnea, PND, lower extremity edema or claudication.  He did feel unwell when he tried to go up to [facility] at [place],000 feet, but had no difficulty once he came down to a lower altitude at [employer].  Metabolic parameters are all well-controlled with the exception of his chronically low HDL cholesterol which is only 33.  He has excellent blood pressure control.  He is quite bradycardic today at 46 bpm but denies fatigue, dizziness or syncope.  He does report that in the morning after taking  his medications he will sometimes doze off briefly.  Past Medical History:  Diagnosis Date   Basal cell carcinoma 01/30/2021   infundibulocystic- right ala nasi - ([facility])   Dyslipidemia    Essential hypertension    Melanoma (HCC) 01/14/2012   Level II- right lower cheek ([facility])   S/P CABG x 4    Squamous cell carcinoma of skin 09/08/2014   in situ- enter fronal scalp (Txpbx)   Squamous cell carcinoma of skin 10/16/2016   in situ- left lower lip (CX35FU)    Past Surgical History:  Procedure Laterality Date   CORONARY ARTERY BYPASS GRAFT N/A 10/06/2018   Procedure: CORONARY ARTERY BYPASS GRAFTING (CABG) x four, using left internal mammary artery and right leg greater saphenous vein harvested endoscopically;  Surgeon: Fleeta Ochoa Coy, MD;  Location: [facility] OR;  Service: Open Heart Surgery;  Laterality: N/A;   LEFT HEART CATH AND CORONARY ANGIOGRAPHY N/A 10/02/2018   Procedure: LEFT HEART CATH AND CORONARY ANGIOGRAPHY;  Surgeon: Claudene Victory ORN, MD;  Location: [facility] INVASIVE [facility];  Service: Cardiovascular;  Laterality: N/A;   TEE WITHOUT CARDIOVERSION N/A 10/06/2018   Procedure: TRANSESOPHAGEAL ECHOCARDIOGRAM (TEE);  Surgeon: Fleeta Ochoa, Coy, MD;  Location: [facility] OR;  Service: Open Heart Surgery;  Laterality: N/A;    Current Medications: Current Meds  Medication Sig   acetaZOLAMIDE  (DIAMOX ) 250 MG tablet Take 1 tablet (250 mg total) by mouth 2 (two) times daily. Start the day BEFORE you arrive at  high altitude,  and then take it 48 hours afterwards.   amLODipine  (NORVASC ) 10 MG tablet TAKE 1 TABLET BY MOUTH DAILY   aspirin  EC 81 MG tablet Take 81 mg by mouth daily.   atorvastatin  (LIPITOR ) 40 MG tablet Take 40 mg by mouth daily.   brimonidine (ALPHAGAN) 0.2 % ophthalmic solution 1 drop 2 (two) times daily.   finasteride (PROSCAR) 5 MG tablet Take 5 mg by mouth daily.   hydrochlorothiazide  (MICROZIDE ) 12.5 MG capsule TAKE 1 CAPSULE BY MOUTH EVERY DAY   isosorbide  mononitrate (IMDUR ) 30 MG 24  hr tablet TAKE 1 TABLET BY MOUTH EVERY DAY   metoprolol  tartrate (LOPRESSOR ) 25 MG tablet TAKE 1 TABLET BY MOUTH TWICE  DAILY   tamsulosin  (FLOMAX ) 0.4 MG CAPS capsule Take 0.4 mg by mouth 2 (two) times daily.     Allergies:   Patient has no known allergies.   Social History   Socioeconomic History   Marital status: Married    Spouse name: Not on file   Number of children: Not on file   Years of education: Not on file   Highest education level: Not on file  Occupational History   Not on file  Tobacco Use   Smoking status: Never   Smokeless tobacco: Never  Vaping Use   Vaping status: Never Used  Substance and Sexual Activity   Alcohol use: Not on file   Drug use: Not on file   Sexual activity: Not on file  Other Topics Concern   Not on file  Social History Narrative   Not on file   Social Drivers of Health   Financial Resource Strain: Not on file  Food Insecurity: Not on file  Transportation Needs: Not on file  Physical Activity: Not on file  Stress: Not on file  Social Connections: Unknown (08/15/2022)   Received from [facility]   Social Network    Social Network: Not on file     Family History: The patient's family history includes Heart attack (age of onset: 74) in his brother; Heart attack (age of onset: 66) in his mother.  ROS:   Please see the history of present illness.    All other systems are reviewed and are negative.   EKGs/Labs/Other Studies Reviewed:    The following studies were reviewed today:   EKG:    EKG Interpretation Date/Time:  Wednesday January 28 2024 08:10:13 EST Ventricular Rate:  46 PR Interval:  234 QRS Duration:  96 QT Interval:  530 QTC Calculation: 463 R Axis:   -19  Text Interpretation: Sinus bradycardia with 1st degree A-V block When compared with ECG of 10-Oct-2018 08:03, PR interval has increased Nonspecific T wave abnormality has replaced inverted T waves in Inferior leads QT has shortened Confirmed  by Konstantin Lehnen ([id number]) on 01/28/2024 8:13:43 AM         Recent Labs: No results found for requested labs within last 365 days.  04/08/2023  potassium 3.5, ALT 25, normal creatinine Recent Lipid Panel    Component Value Date/Time   CHOL 109 12/21/2018 0810   TRIG 61 12/21/2018 0810   HDL 38 (L) 12/21/2018 0810   CHOLHDL 2.9 12/21/2018 0810   CHOLHDL 4.1 10/02/2018 0830   VLDL 15 10/02/2018 0830   LDLCALC 59 12/21/2018 0810  01/26/2021 total cholesterol 115, HDL 35 LDL 64, triglycerides 77 01/31/2022 Total cholesterol 122, HDL 35, LDL 69, triglycerides 90 04/08/2023 Cholesterol 119, HDL 33, LDL 70, triglycerides 75  Physical Exam:  VS:  BP 122/70 (BP Location: Left Arm, Patient Position: Sitting)   Pulse (!) 46   Ht 5' 10 (1.778 m)   Wt 234 lb (106.1 kg)   SpO2 93%   BMI 33.58 kg/m     Wt Readings from Last 3 Encounters:  01/28/24 234 lb (106.1 kg)  01/27/23 234 lb 12.8 oz (106.5 kg)  12/12/21 226 lb 6.4 oz (102.7 kg)      General: Alert, oriented x3, no distress, mildly obese Head: no evidence of trauma, PERRL, EOMI, no exophtalmos or lid lag, no myxedema, no xanthelasma; normal ears, nose and oropharynx Neck: normal jugular venous pulsations and no hepatojugular reflux; brisk carotid pulses without delay and no carotid bruits Chest: clear to auscultation, no signs of consolidation by percussion or palpation, normal fremitus, symmetrical and full respiratory excursions Cardiovascular: normal position and quality of the apical impulse, regular rhythm, normal first and second heart sounds, no murmurs, rubs or gallops Abdomen: no tenderness or distention, no masses by palpation, no abnormal pulsatility or arterial bruits, normal bowel sounds, no hepatosplenomegaly Extremities: no clubbing, cyanosis or edema; 2+ radial, ulnar and brachial pulses bilaterally; 2+ right femoral, posterior tibial and dorsalis pedis pulses; 2+ left femoral, posterior tibial and dorsalis  pedis pulses; no subclavian or femoral bruits Neurological: grossly nonfocal Psych: Normal mood and affect     ASSESSMENT:    1. Coronary artery disease of native artery of native heart with stable angina pectoris (HCC)   2. PAD (peripheral artery disease) (HCC)   3. Postoperative atrial fibrillation (HCC)   4. Dyslipidemia (high LDL; low HDL)   5. Essential hypertension      PLAN:    In order of problems listed above:  CAD s/p CABG: Currently angina free.  I think we need to wean him off the beta-blocker due to what sounds like symptomatic bradycardia.  He will remain on 2 other antianginals and is on aspirin  and statin.SABRA PAD: No longer complains of any claudication.  Continue walking program. AFib: No recurrence since the postoperative period.  Not on anticoagulants.  Stopping beta-blockers due to significant bradycardia. HLP: Chronically low HDL but otherwise all lipid parameters are in target range.  Encouraged weight loss and increase in physical activity. HTN: Suspect this will remain well-controlled even off the metoprolol  but asked them to send us  a log in about a week's time.  Medication Adjustments/Labs and Tests Ordered: Current medicines are reviewed at length with the patient today.  Concerns regarding medicines are outlined above.  Orders Placed This Encounter  Procedures   EKG 12-Lead   No orders of the defined types were placed in this encounter.   Patient Instructions  Medication Instructions:  Wean off of metoprolol  as follows: take 1/2 tablet by mouth twice per day for 3 days then stop completely. *If you need a refill on your cardiac medications before your next appointment, please call your pharmacy*   Follow-Up: At [facility], you and your health needs are our priority.  As part of our continuing mission to provide you with exceptional heart care, we have created designated Provider Care Teams.  These Care Teams include your primary  Cardiologist (physician) and Advanced Practice Providers (APPs -  Physician Assistants and Nurse Practitioners) who all work together to provide you with the care you need, when you need it.  We recommend signing up for the patient portal called MyChart.  Sign up information is provided on this After Visit Summary.  MyChart is used  to connect with patients for Virtual Visits (Telemedicine).  Patients are able to view lab/test results, encounter notes, upcoming appointments, etc.  Non-urgent messages can be sent to your provider as well.   To learn more about what you can do with MyChart, go to [email or web address].    Your next appointment:   1 year(s)  Provider:   Jerel Balding, MD     Other Instructions Take and record blood pressure daily for one week. Record on BP log provided in office.              Signed, Jerel Balding, MD  01/28/2024 10:48 AM    [facility]

## 2024-02-24 DIAGNOSIS — H905 Unspecified sensorineural hearing loss: Secondary | ICD-10-CM | POA: Diagnosis not present

## 2024-03-23 DIAGNOSIS — J069 Acute upper respiratory infection, unspecified: Secondary | ICD-10-CM | POA: Diagnosis not present

## 2024-03-30 DIAGNOSIS — D485 Neoplasm of uncertain behavior of skin: Secondary | ICD-10-CM | POA: Diagnosis not present

## 2024-03-30 DIAGNOSIS — L570 Actinic keratosis: Secondary | ICD-10-CM | POA: Diagnosis not present

## 2024-03-30 DIAGNOSIS — Z86008 Personal history of in-situ neoplasm of other site: Secondary | ICD-10-CM | POA: Diagnosis not present

## 2024-03-30 DIAGNOSIS — D235 Other benign neoplasm of skin of trunk: Secondary | ICD-10-CM | POA: Diagnosis not present

## 2024-03-30 DIAGNOSIS — L821 Other seborrheic keratosis: Secondary | ICD-10-CM | POA: Diagnosis not present

## 2024-03-30 DIAGNOSIS — Z129 Encounter for screening for malignant neoplasm, site unspecified: Secondary | ICD-10-CM | POA: Diagnosis not present

## 2024-03-30 DIAGNOSIS — Z85820 Personal history of malignant melanoma of skin: Secondary | ICD-10-CM | POA: Diagnosis not present

## 2024-04-01 ENCOUNTER — Other Ambulatory Visit: Payer: Self-pay | Admitting: Cardiovascular Disease

## 2024-04-26 DIAGNOSIS — I10 Essential (primary) hypertension: Secondary | ICD-10-CM | POA: Diagnosis not present

## 2024-04-26 DIAGNOSIS — M79674 Pain in right toe(s): Secondary | ICD-10-CM | POA: Diagnosis not present

## 2024-04-26 DIAGNOSIS — T753XXA Motion sickness, initial encounter: Secondary | ICD-10-CM | POA: Diagnosis not present

## 2024-04-26 DIAGNOSIS — Z0000 Encounter for general adult medical examination without abnormal findings: Secondary | ICD-10-CM | POA: Diagnosis not present

## 2024-04-26 DIAGNOSIS — E7800 Pure hypercholesterolemia, unspecified: Secondary | ICD-10-CM | POA: Diagnosis not present

## 2024-04-28 ENCOUNTER — Encounter: Payer: Self-pay | Admitting: Cardiovascular Disease

## 2024-05-04 DIAGNOSIS — J40 Bronchitis, not specified as acute or chronic: Secondary | ICD-10-CM | POA: Diagnosis not present

## 2024-05-14 DIAGNOSIS — J0111 Acute recurrent frontal sinusitis: Secondary | ICD-10-CM | POA: Diagnosis not present

## 2024-05-22 DIAGNOSIS — E7800 Pure hypercholesterolemia, unspecified: Secondary | ICD-10-CM | POA: Diagnosis not present

## 2024-05-22 DIAGNOSIS — I10 Essential (primary) hypertension: Secondary | ICD-10-CM | POA: Diagnosis not present

## 2024-06-14 DIAGNOSIS — H43311 Vitreous membranes and strands, right eye: Secondary | ICD-10-CM | POA: Diagnosis not present

## 2024-06-14 DIAGNOSIS — H35372 Puckering of macula, left eye: Secondary | ICD-10-CM | POA: Diagnosis not present

## 2024-06-14 DIAGNOSIS — H02885 Meibomian gland dysfunction left lower eyelid: Secondary | ICD-10-CM | POA: Diagnosis not present

## 2024-06-14 DIAGNOSIS — H43312 Vitreous membranes and strands, left eye: Secondary | ICD-10-CM | POA: Diagnosis not present

## 2024-06-14 DIAGNOSIS — H40053 Ocular hypertension, bilateral: Secondary | ICD-10-CM | POA: Diagnosis not present

## 2024-06-14 DIAGNOSIS — H35032 Hypertensive retinopathy, left eye: Secondary | ICD-10-CM | POA: Diagnosis not present

## 2024-06-14 DIAGNOSIS — H02413 Mechanical ptosis of bilateral eyelids: Secondary | ICD-10-CM | POA: Diagnosis not present

## 2024-06-14 DIAGNOSIS — H43813 Vitreous degeneration, bilateral: Secondary | ICD-10-CM | POA: Diagnosis not present

## 2024-06-14 DIAGNOSIS — H25811 Combined forms of age-related cataract, right eye: Secondary | ICD-10-CM | POA: Diagnosis not present

## 2024-06-14 DIAGNOSIS — Z961 Presence of intraocular lens: Secondary | ICD-10-CM | POA: Diagnosis not present

## 2024-06-14 DIAGNOSIS — H524 Presbyopia: Secondary | ICD-10-CM | POA: Diagnosis not present

## 2024-06-21 DIAGNOSIS — E7800 Pure hypercholesterolemia, unspecified: Secondary | ICD-10-CM | POA: Diagnosis not present

## 2024-06-21 DIAGNOSIS — I10 Essential (primary) hypertension: Secondary | ICD-10-CM | POA: Diagnosis not present

## 2024-07-04 ENCOUNTER — Other Ambulatory Visit: Payer: Self-pay | Admitting: Cardiovascular Disease

## 2024-07-22 DIAGNOSIS — E7800 Pure hypercholesterolemia, unspecified: Secondary | ICD-10-CM | POA: Diagnosis not present

## 2024-07-22 DIAGNOSIS — I10 Essential (primary) hypertension: Secondary | ICD-10-CM | POA: Diagnosis not present

## 2024-07-26 DIAGNOSIS — R059 Cough, unspecified: Secondary | ICD-10-CM | POA: Diagnosis not present

## 2024-08-22 DIAGNOSIS — E7800 Pure hypercholesterolemia, unspecified: Secondary | ICD-10-CM | POA: Diagnosis not present

## 2024-08-22 DIAGNOSIS — I10 Essential (primary) hypertension: Secondary | ICD-10-CM | POA: Diagnosis not present

## 2024-08-31 DIAGNOSIS — H524 Presbyopia: Secondary | ICD-10-CM | POA: Diagnosis not present

## 2024-08-31 DIAGNOSIS — H43312 Vitreous membranes and strands, left eye: Secondary | ICD-10-CM | POA: Diagnosis not present

## 2024-08-31 DIAGNOSIS — Z961 Presence of intraocular lens: Secondary | ICD-10-CM | POA: Diagnosis not present

## 2024-08-31 DIAGNOSIS — H02413 Mechanical ptosis of bilateral eyelids: Secondary | ICD-10-CM | POA: Diagnosis not present

## 2024-08-31 DIAGNOSIS — H02885 Meibomian gland dysfunction left lower eyelid: Secondary | ICD-10-CM | POA: Diagnosis not present

## 2024-08-31 DIAGNOSIS — H35372 Puckering of macula, left eye: Secondary | ICD-10-CM | POA: Diagnosis not present

## 2024-08-31 DIAGNOSIS — H43311 Vitreous membranes and strands, right eye: Secondary | ICD-10-CM | POA: Diagnosis not present

## 2024-08-31 DIAGNOSIS — H43813 Vitreous degeneration, bilateral: Secondary | ICD-10-CM | POA: Diagnosis not present

## 2024-08-31 DIAGNOSIS — H25811 Combined forms of age-related cataract, right eye: Secondary | ICD-10-CM | POA: Diagnosis not present

## 2024-08-31 DIAGNOSIS — H35032 Hypertensive retinopathy, left eye: Secondary | ICD-10-CM | POA: Diagnosis not present

## 2024-08-31 DIAGNOSIS — H40053 Ocular hypertension, bilateral: Secondary | ICD-10-CM | POA: Diagnosis not present

## 2024-09-21 ENCOUNTER — Other Ambulatory Visit: Payer: Self-pay | Admitting: Cardiovascular Disease

## 2024-09-21 DIAGNOSIS — E7800 Pure hypercholesterolemia, unspecified: Secondary | ICD-10-CM | POA: Diagnosis not present

## 2024-09-21 DIAGNOSIS — I10 Essential (primary) hypertension: Secondary | ICD-10-CM | POA: Diagnosis not present

## 2024-10-05 DIAGNOSIS — Z23 Encounter for immunization: Secondary | ICD-10-CM | POA: Diagnosis not present

## 2025-03-21 ENCOUNTER — Ambulatory Visit: Admitting: Cardiovascular Disease
